# Patient Record
Sex: Female | Born: 1945 | Race: White | Hispanic: No | Marital: Married | Smoking: Never smoker
Health system: Southern US, Community
[De-identification: ages and names within clinical notes are randomized; demographics above are authoritative.]

## PROBLEM LIST (undated history)

## (undated) DIAGNOSIS — N183 Chronic kidney disease, stage 3 unspecified: Secondary | ICD-10-CM

## (undated) DIAGNOSIS — Z87442 Personal history of urinary calculi: Secondary | ICD-10-CM

## (undated) DIAGNOSIS — Z9889 Other specified postprocedural states: Secondary | ICD-10-CM

## (undated) DIAGNOSIS — J189 Pneumonia, unspecified organism: Secondary | ICD-10-CM

## (undated) DIAGNOSIS — Z860100 Personal history of colon polyps, unspecified: Secondary | ICD-10-CM

## (undated) DIAGNOSIS — I82409 Acute embolism and thrombosis of unspecified deep veins of unspecified lower extremity: Secondary | ICD-10-CM

## (undated) DIAGNOSIS — G4730 Sleep apnea, unspecified: Secondary | ICD-10-CM

## (undated) DIAGNOSIS — S92901A Unspecified fracture of right foot, initial encounter for closed fracture: Secondary | ICD-10-CM

## (undated) DIAGNOSIS — E785 Hyperlipidemia, unspecified: Secondary | ICD-10-CM

## (undated) DIAGNOSIS — Z972 Presence of dental prosthetic device (complete) (partial): Secondary | ICD-10-CM

## (undated) DIAGNOSIS — R42 Dizziness and giddiness: Secondary | ICD-10-CM

## (undated) HISTORY — PX: CARDIOVASCULAR STRESS TEST: SHX262

## (undated) HISTORY — DX: Pneumonia, unspecified organism: J18.9

## (undated) HISTORY — PX: COLECTOMY: SHX59

## (undated) HISTORY — PX: APPENDECTOMY: SHX54

## (undated) HISTORY — DX: Unspecified fracture of right foot, initial encounter for closed fracture: S92.901A

## (undated) HISTORY — DX: Other specified postprocedural states: Z98.890

## (undated) HISTORY — DX: Acute embolism and thrombosis of unspecified deep veins of unspecified lower extremity: I82.409

## (undated) HISTORY — PX: CHOLECYSTECTOMY: SHX55

## (undated) HISTORY — PX: ABDOMINAL HYSTERECTOMY: SHX81

## (undated) HISTORY — PX: GLAUCOMA SURGERY: SHX656

## (undated) HISTORY — DX: Personal history of colon polyps, unspecified: Z86.0100

## (undated) HISTORY — PX: CARPAL TUNNEL RELEASE: SHX101

## (undated) HISTORY — PX: KIDNEY STONE SURGERY: SHX686

## (undated) HISTORY — PX: CARDIAC CATHETERIZATION: SHX172

## (undated) HISTORY — PX: OTHER SURGICAL HISTORY: SHX169

## (undated) HISTORY — DX: Personal history of colonic polyps: Z86.010

## (undated) HISTORY — DX: Hyperlipidemia, unspecified: E78.5

---

## 1978-05-12 DIAGNOSIS — Z9889 Other specified postprocedural states: Secondary | ICD-10-CM

## 1978-05-12 HISTORY — DX: Other specified postprocedural states: Z98.890

## 2003-10-13 ENCOUNTER — Other Ambulatory Visit: Payer: Self-pay

## 2004-03-03 ENCOUNTER — Emergency Department: Payer: Self-pay | Admitting: Emergency Medicine

## 2004-03-04 ENCOUNTER — Ambulatory Visit: Payer: Self-pay | Admitting: Emergency Medicine

## 2004-03-25 ENCOUNTER — Ambulatory Visit: Payer: Self-pay | Admitting: Family Medicine

## 2004-03-25 ENCOUNTER — Other Ambulatory Visit: Payer: Self-pay

## 2004-03-25 ENCOUNTER — Emergency Department: Payer: Self-pay | Admitting: Unknown Physician Specialty

## 2004-05-07 ENCOUNTER — Ambulatory Visit: Payer: Self-pay | Admitting: Family Medicine

## 2004-05-16 ENCOUNTER — Ambulatory Visit: Payer: Self-pay | Admitting: Family Medicine

## 2005-02-13 ENCOUNTER — Ambulatory Visit: Payer: Self-pay | Admitting: Family Medicine

## 2005-10-18 ENCOUNTER — Inpatient Hospital Stay: Payer: Self-pay | Admitting: Internal Medicine

## 2005-12-02 ENCOUNTER — Ambulatory Visit: Payer: Self-pay | Admitting: Family Medicine

## 2006-01-19 ENCOUNTER — Ambulatory Visit: Payer: Self-pay | Admitting: Family Medicine

## 2006-03-03 ENCOUNTER — Ambulatory Visit: Payer: Self-pay | Admitting: Gastroenterology

## 2007-02-04 ENCOUNTER — Ambulatory Visit: Payer: Self-pay | Admitting: Family Medicine

## 2007-03-10 ENCOUNTER — Ambulatory Visit: Payer: Self-pay | Admitting: Family Medicine

## 2007-04-23 ENCOUNTER — Emergency Department: Payer: Self-pay | Admitting: Emergency Medicine

## 2007-05-27 ENCOUNTER — Ambulatory Visit: Payer: Self-pay

## 2007-11-11 ENCOUNTER — Ambulatory Visit: Payer: Self-pay | Admitting: Unknown Physician Specialty

## 2007-11-11 ENCOUNTER — Other Ambulatory Visit: Payer: Self-pay

## 2007-11-17 ENCOUNTER — Inpatient Hospital Stay: Payer: Self-pay | Admitting: Unknown Physician Specialty

## 2007-11-19 ENCOUNTER — Other Ambulatory Visit: Payer: Self-pay

## 2008-07-26 ENCOUNTER — Emergency Department: Payer: Self-pay | Admitting: Emergency Medicine

## 2008-10-24 ENCOUNTER — Ambulatory Visit: Payer: Self-pay | Admitting: Family Medicine

## 2008-12-01 ENCOUNTER — Observation Stay: Payer: Self-pay | Admitting: Internal Medicine

## 2008-12-06 ENCOUNTER — Ambulatory Visit: Payer: Self-pay | Admitting: Family Medicine

## 2008-12-07 ENCOUNTER — Inpatient Hospital Stay: Payer: Self-pay | Admitting: Surgery

## 2009-01-08 LAB — HM COLONOSCOPY: HM Colonoscopy: NORMAL

## 2009-03-06 ENCOUNTER — Ambulatory Visit: Payer: Self-pay | Admitting: Unknown Physician Specialty

## 2009-03-14 ENCOUNTER — Ambulatory Visit: Payer: Self-pay | Admitting: Gastroenterology

## 2009-04-12 ENCOUNTER — Inpatient Hospital Stay: Payer: Self-pay | Admitting: Internal Medicine

## 2009-08-09 ENCOUNTER — Ambulatory Visit: Payer: Self-pay | Admitting: Internal Medicine

## 2009-09-24 ENCOUNTER — Ambulatory Visit: Payer: Self-pay | Admitting: Family Medicine

## 2010-03-26 ENCOUNTER — Ambulatory Visit: Payer: Self-pay | Admitting: Internal Medicine

## 2010-05-10 ENCOUNTER — Ambulatory Visit: Payer: Self-pay | Admitting: Internal Medicine

## 2010-09-23 LAB — TSH: TSH: 11.4 u[IU]/mL — AB (ref 0.41–5.90)

## 2010-09-23 LAB — BASIC METABOLIC PANEL
BUN: 15 mg/dL (ref 4–21)
Glucose: 145 mg/dL

## 2010-09-23 LAB — HEPATIC FUNCTION PANEL
AST: 25 U/L (ref 13–35)
Bilirubin, Total: 0.5 mg/dL

## 2010-10-03 ENCOUNTER — Ambulatory Visit: Payer: Medicare Other | Admitting: Internal Medicine

## 2010-10-17 ENCOUNTER — Ambulatory Visit: Payer: Medicare Other | Admitting: Bariatrics

## 2010-12-03 ENCOUNTER — Ambulatory Visit: Payer: Medicare Other | Admitting: Bariatrics

## 2010-12-11 ENCOUNTER — Ambulatory Visit: Payer: Medicare Other | Admitting: Bariatrics

## 2010-12-19 ENCOUNTER — Encounter: Payer: Self-pay | Admitting: Internal Medicine

## 2010-12-23 ENCOUNTER — Ambulatory Visit: Payer: Medicare Other | Admitting: Bariatrics

## 2010-12-25 ENCOUNTER — Encounter: Payer: Self-pay | Admitting: Internal Medicine

## 2010-12-25 DIAGNOSIS — M8580 Other specified disorders of bone density and structure, unspecified site: Secondary | ICD-10-CM | POA: Insufficient documentation

## 2010-12-25 DIAGNOSIS — I82409 Acute embolism and thrombosis of unspecified deep veins of unspecified lower extremity: Secondary | ICD-10-CM

## 2010-12-25 DIAGNOSIS — F319 Bipolar disorder, unspecified: Secondary | ICD-10-CM | POA: Insufficient documentation

## 2010-12-25 DIAGNOSIS — E1165 Type 2 diabetes mellitus with hyperglycemia: Secondary | ICD-10-CM | POA: Insufficient documentation

## 2010-12-25 DIAGNOSIS — IMO0002 Reserved for concepts with insufficient information to code with codable children: Secondary | ICD-10-CM | POA: Insufficient documentation

## 2011-01-09 ENCOUNTER — Encounter: Payer: Self-pay | Admitting: Internal Medicine

## 2011-01-09 ENCOUNTER — Ambulatory Visit (INDEPENDENT_AMBULATORY_CARE_PROVIDER_SITE_OTHER): Payer: Medicare Other | Admitting: Internal Medicine

## 2011-01-09 DIAGNOSIS — E669 Obesity, unspecified: Secondary | ICD-10-CM

## 2011-01-09 DIAGNOSIS — E559 Vitamin D deficiency, unspecified: Secondary | ICD-10-CM

## 2011-01-09 DIAGNOSIS — E119 Type 2 diabetes mellitus without complications: Secondary | ICD-10-CM

## 2011-01-09 MED ORDER — METFORMIN HCL 1000 MG PO TABS: 1000.0000 mg | ORAL_TABLET | Freq: Two times a day (BID) | ORAL | Status: DC

## 2011-01-09 MED ORDER — GLIMEPIRIDE 4 MG PO TABS: 4.0000 mg | ORAL_TABLET | Freq: Two times a day (BID) | ORAL | Status: DC

## 2011-01-09 NOTE — Assessment & Plan Note (Signed)
Assessment:  Obesity with BMI and comorbidities including diabetes mellitus.  Plan:  1. Discussed proper diet (low fat, low sodium, high fiber) with patient and keeping a food log.  2. Discussed need for regular exercise (minumum of 3 times per week, 20 minutes per session) with patient.   3. Pt will continue to follow up with Bariatric Surgery in evaluation for gastric bypass. 4. Follow up in 1 month and as needed.

## 2011-01-09 NOTE — Progress Notes (Signed)
Subjective:    Patient ID: [name], female    DOB: 1945/08/11, 65 y.o.   MRN: [id number]  HPI [name] is a 65 year old female with a history of diabetes and obesity who presents for followup. In regards to her diabetes she reports blood sugars have been stable, mostly near 100 fasting. She has had a couple of low blood sugars which have been symptomatic. These have all been 50 or below. She denies blood sugars above 200. She denies polyuria or polyphasia. She denies any wounds.  [name] has been diligently working on her diet. She reports an 8 pound weight loss in the last month. She is in the process of being evaluated by bariatric surgery for gastric bypass. She recently reports having a sleep study which showed mild sleep apnea. She reports the study also showed frequent runs of SVT. She was sent to cardiology for evaluation. She had echocardiogram and stress test this morning. She is awaiting results from these studies prior to moving forward with her gastric bypass. She has not kept a food log since her last visit. She has not been exercising regularly.  Outpatient Encounter Prescriptions as of 01/09/2011  Medication Sig Dispense Refill  . citalopram (CELEXA) 20 MG tablet Take 20 mg by mouth daily.        Marland Kitchen glimepiride (AMARYL) 4 MG tablet Take 1 tablet (4 mg total) by mouth 2 (two) times daily.  180 tablet  4  . insulin detemir (LEVEMIR) 100 UNIT/ML injection Inject 40 Units into the skin 2 (two) times daily.       . magnesium gluconate (MAGONATE) 500 MG tablet Take 500 mg by mouth daily.        . metFORMIN (GLUCOPHAGE) 1000 MG tablet Take 1 tablet (1,000 mg total) by mouth 2 (two) times daily with a meal.  180 tablet  4    Review of Systems  Constitutional: Negative for fever, chills, appetite change, fatigue and unexpected weight change.  HENT: Negative for ear pain, congestion, neck pain and sinus pressure.   Eyes: Negative for visual disturbance.  Respiratory: Negative for  cough, shortness of breath, wheezing and stridor.   Cardiovascular: Negative for chest pain, palpitations and leg swelling.  Gastrointestinal: Negative for nausea, vomiting, abdominal pain, diarrhea, constipation, blood in stool, abdominal distention and anal bleeding.  Genitourinary: Negative for dysuria and flank pain.  Musculoskeletal: Negative for myalgias, arthralgias and gait problem.  Skin: Negative for color change and rash.  Neurological: Negative for dizziness and headaches.  Hematological: Negative for adenopathy. Does not bruise/bleed easily.  Psychiatric/Behavioral: Negative for suicidal ideas, sleep disturbance and dysphoric mood. The patient is not nervous/anxious.     BP 110/69  Pulse 85  Temp(Src) 98.6 F (37 C) (Oral)  Resp 16  Ht 5\' 2"  (1.575 m)  Wt 235 lb (106.595 kg)  BMI 42.98 kg/m2  SpO2 97%     Objective:   Physical Exam  Constitutional: She is oriented to person, place, and time. She appears well-developed and well-nourished. No distress.  HENT:  Head: Normocephalic and atraumatic.  Right Ear: External ear normal.  Left Ear: External ear normal.  Nose: Nose normal.  Mouth/Throat: Oropharynx is clear and moist. No oropharyngeal exudate.  Eyes: Conjunctivae and EOM are normal. Pupils are equal, round, and reactive to light. Right eye exhibits no discharge. Left eye exhibits no discharge. No scleral icterus.  Neck: Normal range of motion. Neck supple. No tracheal deviation present. No thyromegaly present.  Cardiovascular: Normal rate,  regular rhythm, normal heart sounds and intact distal pulses.  Exam reveals no gallop and no friction rub.   No murmur heard. Pulmonary/Chest: Effort normal and breath sounds normal. No respiratory distress. She has no wheezes. She has no rales. She exhibits no tenderness.  Musculoskeletal: Normal range of motion. She exhibits no edema and no tenderness.  Lymphadenopathy:    She has no cervical adenopathy.  Neurological: She  is alert and oriented to person, place, and time. No cranial nerve deficit. She exhibits normal muscle tone. Coordination normal.  Skin: Skin is warm and dry. No rash noted. She is not diaphoretic. No erythema. No pallor.  Psychiatric: She has a normal mood and affect. Her behavior is normal. Judgment and thought content normal.          Assessment & Plan:

## 2011-01-09 NOTE — Assessment & Plan Note (Addendum)
    Assessment:    Diabetes Mellitus type II, under good control.    Plan:    1.  Rx changes: none A1c today with labs. 2.  Education: Reviewed 'ABCs' of diabetes management (respective goals in parentheses):  A1C (<7), blood pressure (<130/80), and cholesterol (LDL <100). 3.  Compliance at present is estimated to be good. Efforts to improve compliance (if necessary) will be directed at dietary modifications: avoidance of processed carbohydrates and increased exercise. 4. Follow up: 1 month

## 2011-01-10 ENCOUNTER — Encounter: Payer: Self-pay | Admitting: Internal Medicine

## 2011-01-10 LAB — COMPREHENSIVE METABOLIC PANEL
AST: 37 U/L (ref 0–37)
BUN: 15 mg/dL (ref 6–23)
Calcium: 9.6 mg/dL (ref 8.4–10.5)
Chloride: 102 mEq/L (ref 96–112)
Creatinine, Ser: 0.6 mg/dL (ref 0.4–1.2)
GFR: 110.81 mL/min (ref >60.00)
Total Bilirubin: 0.5 mg/dL (ref 0.3–1.2)

## 2011-01-10 LAB — HEMOGLOBIN A1C: Hgb A1c MFr Bld: 9 % — ABNORMAL HIGH (ref 4.6–6.5)

## 2011-01-14 ENCOUNTER — Telehealth: Payer: Self-pay | Admitting: Internal Medicine

## 2011-01-14 NOTE — Telephone Encounter (Signed)
Can you please ask what her question is?

## 2011-01-14 NOTE — Telephone Encounter (Signed)
Patient called to let you know they did admit her at [facility], she has a question, her phone in her room is [phone]

## 2011-01-14 NOTE — Telephone Encounter (Signed)
Spoke with patient and she stated that she has just finished Adenosine and their thinking about doing the catherization  Tomorrow.

## 2011-01-17 ENCOUNTER — Ambulatory Visit (INDEPENDENT_AMBULATORY_CARE_PROVIDER_SITE_OTHER): Payer: Medicare Other | Admitting: Internal Medicine

## 2011-01-17 DIAGNOSIS — I10 Essential (primary) hypertension: Secondary | ICD-10-CM

## 2011-01-17 MED ORDER — CARVEDILOL 6.25 MG PO TABS: 6.2500 mg | ORAL_TABLET | Freq: Two times a day (BID) | ORAL | Status: DC

## 2011-01-19 NOTE — Progress Notes (Signed)
  Subjective:    Patient ID: [name], female    DOB: Mar 02, 1946, 65 y.o.   MRN: [id number]  HPI    Review of Systems     Objective:   Physical Exam        Assessment & Plan:  Orders only entry

## 2011-01-22 ENCOUNTER — Encounter: Payer: Self-pay | Admitting: Internal Medicine

## 2011-01-23 ENCOUNTER — Ambulatory Visit (INDEPENDENT_AMBULATORY_CARE_PROVIDER_SITE_OTHER): Payer: Medicare Other | Admitting: Internal Medicine

## 2011-01-23 ENCOUNTER — Encounter: Payer: Self-pay | Admitting: Internal Medicine

## 2011-01-23 VITALS — BP 110/65 | HR 70 | Temp 98.5°F | Resp 20 | Ht 62.0 in | Wt 241.5 lb

## 2011-01-23 DIAGNOSIS — I209 Angina pectoris, unspecified: Secondary | ICD-10-CM

## 2011-01-23 DIAGNOSIS — I208 Other forms of angina pectoris: Secondary | ICD-10-CM

## 2011-01-23 DIAGNOSIS — M810 Age-related osteoporosis without current pathological fracture: Secondary | ICD-10-CM

## 2011-01-23 DIAGNOSIS — F319 Bipolar disorder, unspecified: Secondary | ICD-10-CM

## 2011-01-23 DIAGNOSIS — E669 Obesity, unspecified: Secondary | ICD-10-CM

## 2011-01-23 DIAGNOSIS — E119 Type 2 diabetes mellitus without complications: Secondary | ICD-10-CM

## 2011-01-23 LAB — GLUCOSE, POCT (MANUAL RESULT ENTRY): POC Glucose: 166

## 2011-01-23 MED ORDER — PANTOPRAZOLE SODIUM 40 MG PO TBEC: 40.0000 mg | DELAYED_RELEASE_TABLET | Freq: Every day | ORAL | Status: DC

## 2011-01-23 NOTE — Progress Notes (Signed)
Subjective:    Patient ID: [name], female    DOB: 1946/04/28, 65 y.o.   MRN: [id number]  HPI Ms. Larson is a 65 year old female with a history of diabetes, obesity, depression, and recently coronary artery disease status post admission at [facility]. She was admitted last month with intermittent exertional chest pain. She had nuclear stress test which was abnormal. She was ultimately admitted at [facility] and underwent cardiac catheterization. She reports that coronary artery disease was found but not significant enough to make an intervention at this time. Since her admission, she continues to have exertional chest pain. This occurs with minimal exertion such as walking in her garden. She takes a single nitroglycerin with relief. She has not had to take more than one nitroglycerin. During these episodes she does experience diaphoresis, and shortness of breath. She has not yet followed up with her cardiologist.  She reports that her blood sugars have been well controlled recently with fasting sugars near 100-150. She has been compliant with her Levemir.  Prior to her hospitalization she had been making arrangements for gastric bypass surgery. In the evaluation process for surgery she underwent a sleep study which revealed sleep apnea. She has not yet started CPAP.  Outpatient Encounter Prescriptions as of 01/23/2011  Medication Sig Dispense Refill  . aspirin 325 MG tablet Take 325 mg by mouth daily.        Marland Kitchen lisinopril (PRINIVIL,ZESTRIL) 5 MG tablet Take 5 mg by mouth daily.        . nitroGLYCERIN (NITROSTAT) 0.4 MG SL tablet Place 0.4 mg under the tongue every 5 (five) minutes as needed.        Marland Kitchen DISCONTD: omeprazole (PRILOSEC) 20 MG capsule Take 20 mg by mouth 2 (two) times daily.        . carvedilol (COREG) 6.25 MG tablet Take 1 tablet (6.25 mg total) by mouth 2 (two) times daily.  180 tablet  4  . citalopram (CELEXA) 20 MG tablet Take 20 mg by mouth daily.        Marland Kitchen  glimepiride (AMARYL) 4 MG tablet Take 1 tablet (4 mg total) by mouth 2 (two) times daily.  180 tablet  4  . insulin detemir (LEVEMIR) 100 UNIT/ML injection Inject 40 Units into the skin 2 (two) times daily.       . metFORMIN (GLUCOPHAGE) 1000 MG tablet Take 1 tablet (1,000 mg total) by mouth 2 (two) times daily with a meal.  180 tablet  4  . pantoprazole (PROTONIX) 40 MG tablet Take 1 tablet (40 mg total) by mouth daily.  90 tablet  4    Review of Systems  Constitutional: Positive for diaphoresis and fatigue. Negative for fever, chills, appetite change and unexpected weight change.  HENT: Negative for ear pain, congestion, sore throat, trouble swallowing, neck pain, voice change and sinus pressure.   Eyes: Negative for visual disturbance.  Respiratory: Positive for shortness of breath. Negative for cough, wheezing and stridor.   Cardiovascular: Positive for chest pain and leg swelling. Negative for palpitations.  Gastrointestinal: Negative for nausea, vomiting, abdominal pain, diarrhea, constipation, blood in stool, abdominal distention and anal bleeding.  Genitourinary: Negative for dysuria and flank pain.  Musculoskeletal: Negative for myalgias, arthralgias and gait problem.  Skin: Negative for color change and rash.  Neurological: Negative for dizziness and headaches.  Hematological: Negative for adenopathy. Does not bruise/bleed easily.  Psychiatric/Behavioral: Negative for suicidal ideas, sleep disturbance and dysphoric mood. The patient is not nervous/anxious.  BP 110/65  Pulse 70  Temp(Src) 98.5 F (36.9 C) (Oral)  Resp 20  Ht 5\' 2"  (1.575 m)  Wt 241 lb 8 oz (109.544 kg)  BMI 44.17 kg/m2  SpO2 97%     Objective:   Physical Exam  Constitutional: She is oriented to person, place, and time. She appears well-developed and well-nourished. No distress.  HENT:  Head: Normocephalic and atraumatic.  Right Ear: External ear normal.  Left Ear: External ear normal.  Nose: Nose  normal.  Mouth/Throat: Oropharynx is clear and moist. No oropharyngeal exudate.  Eyes: Conjunctivae are normal. Pupils are equal, round, and reactive to light. Right eye exhibits no discharge. Left eye exhibits no discharge. No scleral icterus.  Neck: Normal range of motion. Neck supple. No tracheal deviation present. No thyromegaly present.  Cardiovascular: Normal rate, regular rhythm, normal heart sounds and intact distal pulses.  Exam reveals no gallop and no friction rub.   No murmur heard. Pulmonary/Chest: Effort normal and breath sounds normal. No respiratory distress. She has no wheezes. She has no rales. She exhibits no tenderness.  Musculoskeletal: Normal range of motion. She exhibits no edema and no tenderness.  Lymphadenopathy:    She has no cervical adenopathy.  Neurological: She is alert and oriented to person, place, and time. No cranial nerve deficit. She exhibits normal muscle tone. Coordination normal.  Skin: Skin is warm and dry. No rash noted. She is not diaphoretic. No erythema. No pallor.  Psychiatric: She has a normal mood and affect. Her behavior is normal. Judgment and thought content normal.          Assessment & Plan:  1. Angina - patient status post recent admission for coronary artery disease. Will request records on her hospitalization and cardiac catheter. Given that she has persistent angina and reports that she had no significant blockages, I question if she may have small vessel disease. If her symptoms persist we discussed placing her on a long-acting nitrate. However, her blood pressure tends to run on the low side it is not clear that she will tolerate a nitrate. We will plan for her to followup in 2 weeks.  2. Diabetes - patient with diabetes which has recently been fairly well controlled. Last hemoglobin A1c was 9. However, this is likely improved after her recent hospitalization as she reports her blood sugars have been closer to 100 fasting. We'll plan to  repeat her hemoglobin A1c in November 2012.  3. Obesity - patient with obesity BMI is 44. Prior to her hospitalization she was undergoing evaluation for gastric bypass surgery. Given her recent diagnosis of coronary artery disease and ongoing angina we will plan for her to hold off on further surgical evaluation at this time.  4. Sleep Apnea - patient reports a recent sleep study showed mild sleep apnea. We will request results on this. I've encouraged her to consider using CPAP given the risk associated with untreated sleep apnea.

## 2011-02-12 ENCOUNTER — Ambulatory Visit (INDEPENDENT_AMBULATORY_CARE_PROVIDER_SITE_OTHER): Payer: Medicare Other | Admitting: Internal Medicine

## 2011-02-12 ENCOUNTER — Encounter: Payer: Self-pay | Admitting: Internal Medicine

## 2011-02-12 VITALS — BP 100/60 | HR 82 | Temp 98.4°F | Resp 20 | Wt 248.0 lb

## 2011-02-12 DIAGNOSIS — E669 Obesity, unspecified: Secondary | ICD-10-CM

## 2011-02-12 DIAGNOSIS — K219 Gastro-esophageal reflux disease without esophagitis: Secondary | ICD-10-CM

## 2011-02-12 DIAGNOSIS — J40 Bronchitis, not specified as acute or chronic: Secondary | ICD-10-CM

## 2011-02-12 DIAGNOSIS — E119 Type 2 diabetes mellitus without complications: Secondary | ICD-10-CM

## 2011-02-12 DIAGNOSIS — I10 Essential (primary) hypertension: Secondary | ICD-10-CM

## 2011-02-12 MED ORDER — PANTOPRAZOLE SODIUM 40 MG PO TBEC: 40.0000 mg | DELAYED_RELEASE_TABLET | Freq: Every day | ORAL | Status: DC

## 2011-02-12 MED ORDER — AMOXICILLIN-POT CLAVULANATE 875-125 MG PO TABS: 1.0000 | ORAL_TABLET | Freq: Two times a day (BID) | ORAL | Status: AC

## 2011-02-12 NOTE — Progress Notes (Signed)
Subjective:    Patient ID: [name], female    DOB: 03-Dec-1945, 65 y.o.   MRN: [id number]  HPI [name] is a 65 year old female with a history of obesity, diabetes who presents for followup. Her primary concern today is recent lightheadedness. She reports this has been limiting her ability to exercise. She notes that this started with the addition of HER-2 blood pressure medicines carvedilol and lisinopril. These were added when she was recently hospitalized with chest pain. She has never been diagnosed with hypertension in the past. She reports that her blood pressure has been low over the last few weeks typically running in the 80s to 90s over 40s to 50s. She would like to stop her blood pressure medication.  [name] reports improved diet choices in an effort to lose weight. She reports limiting her intake of food at night. She reports very little physical activity because of recent lightheadedness.  [name] reports a recent weeklong history of nasal congestion and cough. She has been around her grandchildren who have been ill. She denies any fever or chills. She does have a history of recurrent bronchitis and notes slight increase in dyspnea on exertion.  In regards to her diabetes, [name] reports full compliance with her medications. She reports a few low blood sugars less than 80 for which she has been symptomatic. She occasionally takes less than 40 units of levemir if she has a low blood sugar. She denies any blood sugars over 250.  Outpatient Encounter Prescriptions as of 02/12/2011  Medication Sig Dispense Refill  . aspirin 81 MG tablet Take 81 mg by mouth daily.        . citalopram (CELEXA) 20 MG tablet Take 20 mg by mouth daily.        Marland Kitchen glimepiride (AMARYL) 4 MG tablet Take 1 tablet (4 mg total) by mouth 2 (two) times daily.  180 tablet  4  . insulin detemir (LEVEMIR) 100 UNIT/ML injection Inject 40 Units into the skin 2 (two) times daily.       Marland Kitchen lisinopril  (PRINIVIL,ZESTRIL) 5 MG tablet Take 5 mg by mouth daily.        . metFORMIN (GLUCOPHAGE) 1000 MG tablet Take 1 tablet (1,000 mg total) by mouth 2 (two) times daily with a meal.  180 tablet  4  . nitroGLYCERIN (NITROSTAT) 0.4 MG SL tablet Place 0.4 mg under the tongue every 5 (five) minutes as needed.        . pantoprazole (PROTONIX) 40 MG tablet Take 1 tablet (40 mg total) by mouth daily.  90 tablet  4    Review of Systems  Constitutional: Positive for fatigue. Negative for fever, chills, appetite change and unexpected weight change.  HENT: Positive for congestion, postnasal drip and sinus pressure. Negative for ear pain, sore throat, trouble swallowing, neck pain and voice change.   Eyes: Negative for visual disturbance.  Respiratory: Positive for cough. Negative for shortness of breath, wheezing and stridor.   Cardiovascular: Negative for chest pain, palpitations and leg swelling.  Gastrointestinal: Negative for nausea, vomiting, abdominal pain, diarrhea, constipation, blood in stool, abdominal distention and anal bleeding.  Genitourinary: Negative for dysuria and flank pain.  Musculoskeletal: Negative for myalgias, arthralgias and gait problem.  Skin: Negative for color change and rash.  Neurological: Positive for dizziness and light-headedness. Negative for headaches.  Hematological: Negative for adenopathy. Does not bruise/bleed easily.  Psychiatric/Behavioral: Negative for suicidal ideas, sleep disturbance and dysphoric mood. The patient is not nervous/anxious.  BP 100/60  Pulse 82  Temp(Src) 98.4 F (36.9 C) (Oral)  Resp 20  Wt 248 lb (112.492 kg)  SpO2 97%     Objective:   Physical Exam  Constitutional: She is oriented to person, place, and time. She appears well-developed and well-nourished. No distress.  HENT:  Head: Normocephalic and atraumatic.  Right Ear: Tympanic membrane, external ear and ear canal normal.  Left Ear: Tympanic membrane, external ear and ear canal  normal.  Nose: Nose normal.  Mouth/Throat: Oropharynx is clear and moist. No oropharyngeal exudate.  Eyes: Conjunctivae are normal. Pupils are equal, round, and reactive to light. Right eye exhibits no discharge. Left eye exhibits no discharge. No scleral icterus.  Neck: Normal range of motion. Neck supple. No tracheal deviation present. No thyromegaly present.  Cardiovascular: Normal rate, regular rhythm, normal heart sounds and intact distal pulses.  Exam reveals no gallop and no friction rub.   No murmur heard. Pulmonary/Chest: Effort normal. No respiratory distress. She has wheezes. She has no rales. She exhibits no tenderness.  Musculoskeletal: Normal range of motion. She exhibits no edema and no tenderness.  Lymphadenopathy:    She has no cervical adenopathy.  Neurological: She is alert and oriented to person, place, and time. No cranial nerve deficit. She exhibits normal muscle tone. Coordination normal.  Skin: Skin is warm and dry. No rash noted. She is not diaphoretic. No erythema. No pallor.  Psychiatric: She has a normal mood and affect. Her behavior is normal. Judgment and thought content normal.          Assessment & Plan:  1. Obesity - patient has improved dietary choices but continues to be sedentary. Encouraged her to keep calories less than 1500 per day. Encouraged increased physical activity over the next few weeks with a goal of at least 10 minutes per day. She will followup in one month.  2. Lightheadedness - suspect lightheadedness is secondary to hypotension given the patient was recently started on antihypertensive medications. We'll stop her carvedilol. She will monitor her blood pressure and call with an update next week.  3. Cough - patient with recent cough and increased dyspnea. Exam is consistent with bronchitis. We'll start Augmentin. Patient will followup in one month or earlier should symptoms worsen.  4. Diabetes - patient with diabetes which she reports  has been better controlled recently on Levemir. Encouraged her to decrease dose of Levemir to 30 units twice daily should she have repeated low blood sugars below 80. She will call with an update early next week and followup in one month.

## 2011-02-12 NOTE — Patient Instructions (Signed)
Call early next week with blood pressure readings.

## 2011-02-25 ENCOUNTER — Encounter: Payer: Medicare Other | Admitting: Internal Medicine

## 2011-03-05 ENCOUNTER — Other Ambulatory Visit: Payer: Self-pay | Admitting: Internal Medicine

## 2011-03-05 DIAGNOSIS — E785 Hyperlipidemia, unspecified: Secondary | ICD-10-CM

## 2011-03-05 DIAGNOSIS — Z0000 Encounter for general adult medical examination without abnormal findings: Secondary | ICD-10-CM

## 2011-03-06 ENCOUNTER — Other Ambulatory Visit (INDEPENDENT_AMBULATORY_CARE_PROVIDER_SITE_OTHER): Payer: Medicare Other | Admitting: *Deleted

## 2011-03-06 ENCOUNTER — Ambulatory Visit: Payer: Medicare Other

## 2011-03-06 DIAGNOSIS — Z23 Encounter for immunization: Secondary | ICD-10-CM

## 2011-03-06 DIAGNOSIS — Z0000 Encounter for general adult medical examination without abnormal findings: Secondary | ICD-10-CM

## 2011-03-06 DIAGNOSIS — E785 Hyperlipidemia, unspecified: Secondary | ICD-10-CM

## 2011-03-06 DIAGNOSIS — E119 Type 2 diabetes mellitus without complications: Secondary | ICD-10-CM

## 2011-03-06 LAB — COMPREHENSIVE METABOLIC PANEL
Albumin: 4 g/dL (ref 3.5–5.2)
Alkaline Phosphatase: 58 U/L (ref 39–117)
BUN: 13 mg/dL (ref 6–23)
GFR: 90.64 mL/min (ref >60.00)
Glucose, Bld: 118 mg/dL — ABNORMAL HIGH (ref 70–99)
Potassium: 3.7 mEq/L (ref 3.5–5.1)
Total Bilirubin: 0.6 mg/dL (ref 0.3–1.2)

## 2011-03-06 LAB — TSH: TSH: 0.63 u[IU]/mL (ref 0.35–5.50)

## 2011-03-06 LAB — HEMOGLOBIN A1C: Hgb A1c MFr Bld: 9.4 % — ABNORMAL HIGH (ref 4.6–6.5)

## 2011-03-06 LAB — LIPID PANEL: HDL: 52.8 mg/dL (ref >39.00)

## 2011-03-06 NOTE — Progress Notes (Signed)
Addended by: Jobie Quaker on: 03/06/2011 02:45 PM   Modules accepted: Orders

## 2011-03-13 ENCOUNTER — Encounter: Payer: Medicare Other | Admitting: Internal Medicine

## 2011-03-13 ENCOUNTER — Telehealth: Payer: Self-pay | Admitting: Internal Medicine

## 2011-03-13 NOTE — Telephone Encounter (Signed)
error 

## 2011-03-18 ENCOUNTER — Ambulatory Visit: Payer: Medicare Other | Admitting: Internal Medicine

## 2011-03-21 ENCOUNTER — Ambulatory Visit: Payer: Medicare Other | Admitting: Internal Medicine

## 2011-03-26 ENCOUNTER — Ambulatory Visit: Payer: Medicare Other | Admitting: Internal Medicine

## 2011-04-07 ENCOUNTER — Encounter: Payer: Self-pay | Admitting: Internal Medicine

## 2011-04-07 ENCOUNTER — Ambulatory Visit (INDEPENDENT_AMBULATORY_CARE_PROVIDER_SITE_OTHER): Payer: Medicare Other | Admitting: Internal Medicine

## 2011-04-07 VITALS — BP 148/80 | HR 57 | Temp 98.0°F | Wt 243.0 lb

## 2011-04-07 DIAGNOSIS — R0602 Shortness of breath: Secondary | ICD-10-CM

## 2011-04-07 DIAGNOSIS — R0789 Other chest pain: Secondary | ICD-10-CM

## 2011-04-07 NOTE — Progress Notes (Signed)
Subjective:    Patient ID: [name], female    DOB: 1945-09-01, 65 y.o.   MRN: [id number]  HPI 65 year old female with a history of diabetes, hypertension, hyperlipidemia, and obesity presents for followup. Today she reports that she is feeling very poorly. She woke up with nausea and tightness across her anterior chest. She also reports feeling lightheaded and fatigued. She denies any fever or chills. She has not vomited. She feels mild shortness of breath. She denies palpitations. She denies any sick contacts.  Outpatient Encounter Prescriptions as of 04/07/2011  Medication Sig Dispense Refill  . aspirin 81 MG tablet Take 81 mg by mouth daily.        . citalopram (CELEXA) 20 MG tablet Take 20 mg by mouth daily.        Marland Kitchen glimepiride (AMARYL) 4 MG tablet Take 1 tablet (4 mg total) by mouth 2 (two) times daily.  180 tablet  4  . insulin detemir (LEVEMIR) 100 UNIT/ML injection Inject 40 Units into the skin 2 (two) times daily.       Marland Kitchen lisinopril (PRINIVIL,ZESTRIL) 5 MG tablet Take 5 mg by mouth daily.        . metFORMIN (GLUCOPHAGE) 1000 MG tablet Take 1 tablet (1,000 mg total) by mouth 2 (two) times daily with a meal.  180 tablet  4  . nitroGLYCERIN (NITROSTAT) 0.4 MG SL tablet Place 0.4 mg under the tongue every 5 (five) minutes as needed.        . pantoprazole (PROTONIX) 40 MG tablet Take 1 tablet (40 mg total) by mouth daily.  90 tablet  4    Review of Systems  Constitutional: Negative for fever, chills, appetite change, fatigue and unexpected weight change.  HENT: Negative for ear pain, congestion, sore throat, trouble swallowing, neck pain, voice change and sinus pressure.   Eyes: Negative for visual disturbance.  Respiratory: Positive for shortness of breath. Negative for cough, wheezing and stridor.   Cardiovascular: Positive for chest pain. Negative for palpitations and leg swelling.  Gastrointestinal: Positive for nausea. Negative for vomiting, abdominal pain, diarrhea,  constipation, blood in stool, abdominal distention and anal bleeding.  Genitourinary: Negative for dysuria and flank pain.  Musculoskeletal: Negative for myalgias, arthralgias and gait problem.  Skin: Negative for color change and rash.  Neurological: Negative for dizziness and headaches.  Hematological: Negative for adenopathy. Does not bruise/bleed easily.  Psychiatric/Behavioral: Negative for suicidal ideas, sleep disturbance and dysphoric mood. The patient is not nervous/anxious.    BP 148/80  Pulse 57  Temp(Src) 98 F (36.7 C) (Oral)  Wt 243 lb (110.224 kg)  SpO2 92%     Objective:   Physical Exam  Constitutional: She is oriented to person, place, and time. She appears well-developed and well-nourished. She has a sickly appearance (lying on exam table, pale, diaphoretic). No distress.  HENT:  Head: Normocephalic and atraumatic.  Right Ear: External ear normal.  Left Ear: External ear normal.  Nose: Nose normal.  Mouth/Throat: Oropharynx is clear and moist. No oropharyngeal exudate.  Eyes: Conjunctivae are normal. Pupils are equal, round, and reactive to light. Right eye exhibits no discharge. Left eye exhibits no discharge. No scleral icterus.  Neck: Normal range of motion. Neck supple. No tracheal deviation present. No thyromegaly present.  Cardiovascular: Regular rhythm, normal heart sounds and intact distal pulses.  Bradycardia present.  Exam reveals no gallop and no friction rub.   No murmur heard. Pulmonary/Chest: Effort normal and breath sounds normal. No respiratory distress. She has  no wheezes. She has no rales. She exhibits no tenderness.  Abdominal: Soft. She exhibits no distension. There is no tenderness.  Musculoskeletal: Normal range of motion. She exhibits no edema and no tenderness.  Lymphadenopathy:    She has no cervical adenopathy.  Neurological: She is alert and oriented to person, place, and time. No cranial nerve deficit. She exhibits normal muscle tone.  Coordination normal.  Skin: Skin is warm and dry. No rash noted. She is not diaphoretic. No erythema. No pallor.  Psychiatric: She has a normal mood and affect. Her behavior is normal. Judgment and thought content normal.          Assessment & Plan:  1. Chest pain - Concerning for cardiac ischemia. EKG is markable for bradycardia. Notably, by report, patient had a normal cardiac catheterization approximately one month ago. However, given chest pain, shortness of breath, nausea, and new onset of bradycardia recommended that she is evaluated in the emergency department with labs including troponin levels and close monitoring.

## 2011-04-12 ENCOUNTER — Encounter: Payer: Medicare Other | Admitting: Internal Medicine

## 2011-04-22 ENCOUNTER — Ambulatory Visit (INDEPENDENT_AMBULATORY_CARE_PROVIDER_SITE_OTHER): Payer: Medicare Other | Admitting: Internal Medicine

## 2011-04-22 ENCOUNTER — Encounter: Payer: Self-pay | Admitting: Internal Medicine

## 2011-04-22 VITALS — BP 142/86 | HR 58 | Temp 98.1°F | Wt 244.0 lb

## 2011-04-22 DIAGNOSIS — E119 Type 2 diabetes mellitus without complications: Secondary | ICD-10-CM

## 2011-04-22 DIAGNOSIS — J329 Chronic sinusitis, unspecified: Secondary | ICD-10-CM

## 2011-04-22 MED ORDER — AMOXICILLIN-POT CLAVULANATE 875-125 MG PO TABS: 1.0000 | ORAL_TABLET | Freq: Two times a day (BID) | ORAL | Status: AC

## 2011-04-22 NOTE — Progress Notes (Signed)
Subjective:    Patient ID: [name], female    DOB: 15-Dec-1945, 65 y.o.   MRN: [id number]  HPI 65YO female with DM, HTN, HL, obesity presents for follow up.  Reports she has been doing well. Notes that last episode of chest pain and malaise was associated with use of Tramadol and resolved with stopping medication.  Denies any further chest pain, dyspnea, palpitations.  Has been feeling well.  In regards to diabetes, notes that BG well controlled recently. BG this morning 102. Reports full compliance with meds.  In regards to obesity, has not been physically active because of chronic back pain issues.  Has not been keeping a food diary. Has tried to limit food intake and calories.  Concern today about sinus pressure x 5 days, frontal facial pain, purulent nasal drainage. Denies fever, cough.  Symptoms not alleviated with tylenol.  Outpatient Encounter Prescriptions as of 04/22/2011  Medication Sig Dispense Refill  . aspirin 81 MG tablet Take 81 mg by mouth daily.        . citalopram (CELEXA) 20 MG tablet Take 20 mg by mouth daily.        Marland Kitchen glimepiride (AMARYL) 4 MG tablet Take 1 tablet (4 mg total) by mouth 2 (two) times daily.  180 tablet  4  . insulin detemir (LEVEMIR) 100 UNIT/ML injection Inject 40 Units into the skin 2 (two) times daily.       . metFORMIN (GLUCOPHAGE) 1000 MG tablet Take 1 tablet (1,000 mg total) by mouth 2 (two) times daily with a meal.  180 tablet  4  . nitroGLYCERIN (NITROSTAT) 0.4 MG SL tablet Place 0.4 mg under the tongue every 5 (five) minutes as needed.        . pantoprazole (PROTONIX) 40 MG tablet Take 1 tablet (40 mg total) by mouth daily.  90 tablet  4  . lisinopril (PRINIVIL,ZESTRIL) 5 MG tablet Take 5 mg by mouth daily.          Review of Systems  Constitutional: Negative for fever, chills, appetite change, fatigue and unexpected weight change.  HENT: Positive for congestion and sinus pressure. Negative for ear pain, sore throat, trouble swallowing and  neck pain.   Eyes: Negative for visual disturbance.  Respiratory: Negative for cough, shortness of breath, wheezing and stridor.   Cardiovascular: Negative for chest pain, palpitations and leg swelling.  Gastrointestinal: Negative for nausea, abdominal pain, diarrhea and constipation.  Genitourinary: Negative for dysuria and flank pain.  Musculoskeletal: Negative for myalgias, arthralgias and gait problem.  Skin: Negative for color change and rash.  Neurological: Negative for dizziness and headaches.  Hematological: Negative for adenopathy. Does not bruise/bleed easily.  Psychiatric/Behavioral: Negative for suicidal ideas, sleep disturbance and dysphoric mood. The patient is not nervous/anxious.    BP 142/86  Pulse 58  Temp(Src) 98.1 F (36.7 C) (Oral)  Wt 244 lb (110.678 kg)  SpO2 97%     Objective:   Physical Exam  Constitutional: She is oriented to person, place, and time. She appears well-developed and well-nourished. No distress.  HENT:  Head: Normocephalic and atraumatic.  Right Ear: External ear normal.  Left Ear: External ear normal.  Nose: Mucosal edema present. Right sinus exhibits frontal sinus tenderness. Left sinus exhibits frontal sinus tenderness.  Mouth/Throat: Oropharynx is clear and moist. No oropharyngeal exudate.  Eyes: Conjunctivae are normal. Pupils are equal, round, and reactive to light. Right eye exhibits no discharge. Left eye exhibits no discharge. No scleral icterus.  Neck: Normal range  of motion. Neck supple. No tracheal deviation present. No thyromegaly present.  Cardiovascular: Normal rate, regular rhythm, normal heart sounds and intact distal pulses.  Exam reveals no gallop and no friction rub.   No murmur heard. Pulmonary/Chest: Effort normal and breath sounds normal. No respiratory distress. She has no wheezes. She has no rales. She exhibits no tenderness.  Musculoskeletal: Normal range of motion. She exhibits no edema and no tenderness.    Lymphadenopathy:    She has no cervical adenopathy.  Neurological: She is alert and oriented to person, place, and time. No cranial nerve deficit. She exhibits normal muscle tone. Coordination normal.  Skin: Skin is warm and dry. No rash noted. She is not diaphoretic. No erythema. No pallor.  Psychiatric: She has a normal mood and affect. Her behavior is normal. Judgment and thought content normal.          Assessment & Plan:  1. Sinusitis - Symptoms and exam c/w sinusitis. Will treat with Augmentin x 10 days.  Will use tylenol as needed for pain. Follow up if symptoms not improving next 48hr.  2. Obesity - Weight unchanged. Pt has not been exercising because of back pain. Has been trying to limit caloric intake, but not keeping a food diary. Encouraged healthy food choices and keeping a food diary. Follow up 1 month.  3. Diabetes - Pt reports good control of BG. Last A1c was elevated at 9.4%.  Will check A1c with labs next month.

## 2011-05-13 ENCOUNTER — Encounter: Payer: Self-pay | Admitting: Internal Medicine

## 2011-05-20 DIAGNOSIS — F339 Major depressive disorder, recurrent, unspecified: Secondary | ICD-10-CM | POA: Diagnosis not present

## 2011-05-26 ENCOUNTER — Ambulatory Visit: Payer: Medicare Other | Admitting: Internal Medicine

## 2011-06-04 ENCOUNTER — Emergency Department: Payer: Self-pay

## 2011-06-04 DIAGNOSIS — M549 Dorsalgia, unspecified: Secondary | ICD-10-CM | POA: Diagnosis not present

## 2011-06-04 DIAGNOSIS — I10 Essential (primary) hypertension: Secondary | ICD-10-CM | POA: Diagnosis not present

## 2011-06-04 DIAGNOSIS — Z79899 Other long term (current) drug therapy: Secondary | ICD-10-CM | POA: Diagnosis not present

## 2011-06-04 DIAGNOSIS — I999 Unspecified disorder of circulatory system: Secondary | ICD-10-CM | POA: Diagnosis not present

## 2011-06-04 DIAGNOSIS — G8929 Other chronic pain: Secondary | ICD-10-CM | POA: Diagnosis not present

## 2011-06-04 DIAGNOSIS — M216X9 Other acquired deformities of unspecified foot: Secondary | ICD-10-CM | POA: Diagnosis not present

## 2011-06-04 DIAGNOSIS — R52 Pain, unspecified: Secondary | ICD-10-CM | POA: Diagnosis not present

## 2011-06-04 DIAGNOSIS — M47817 Spondylosis without myelopathy or radiculopathy, lumbosacral region: Secondary | ICD-10-CM | POA: Diagnosis not present

## 2011-06-04 DIAGNOSIS — E119 Type 2 diabetes mellitus without complications: Secondary | ICD-10-CM | POA: Diagnosis not present

## 2011-06-04 DIAGNOSIS — R1110 Vomiting, unspecified: Secondary | ICD-10-CM | POA: Diagnosis not present

## 2011-06-04 DIAGNOSIS — Z9889 Other specified postprocedural states: Secondary | ICD-10-CM | POA: Diagnosis not present

## 2011-06-04 DIAGNOSIS — R51 Headache: Secondary | ICD-10-CM | POA: Diagnosis not present

## 2011-06-04 LAB — URINALYSIS, COMPLETE
Bilirubin,UR: NEGATIVE
Glucose,UR: >=500 mg/dL (ref 0–75)
Leukocyte Esterase: NEGATIVE
Nitrite: NEGATIVE
Ph: 5 (ref 4.5–8.0)
RBC,UR: 1 /HPF (ref 0–5)
Squamous Epithelial: 3
WBC UR: 5 /HPF (ref 0–5)

## 2011-06-04 LAB — CBC
MCH: 29.7 pg (ref 26.0–34.0)
MCHC: 33.3 g/dL (ref 32.0–36.0)
MCV: 89 fL (ref 80–100)
Platelet: 378 10*3/uL (ref 150–440)
RBC: 4.83 10*6/uL (ref 3.80–5.20)

## 2011-06-04 LAB — COMPREHENSIVE METABOLIC PANEL
Albumin: 4.1 g/dL (ref 3.4–5.0)
Anion Gap: 12 (ref 7–16)
BUN: 8 mg/dL (ref 7–18)
Chloride: 97 mmol/L — ABNORMAL LOW (ref 98–107)
Co2: 26 mmol/L (ref 21–32)
EGFR (African American): >60
Osmolality: 279 (ref 275–301)
Potassium: 3.9 mmol/L (ref 3.5–5.1)
SGOT(AST): 42 U/L — ABNORMAL HIGH (ref 15–37)
Sodium: 135 mmol/L — ABNORMAL LOW (ref 136–145)
Total Protein: 8.4 g/dL — ABNORMAL HIGH (ref 6.4–8.2)

## 2011-06-04 LAB — PROTIME-INR: Prothrombin Time: 13.3 secs (ref 11.5–14.7)

## 2011-06-04 LAB — TROPONIN I: Troponin-I: <0.02 ng/mL

## 2011-06-05 DIAGNOSIS — M545 Low back pain, unspecified: Secondary | ICD-10-CM | POA: Diagnosis not present

## 2011-06-05 DIAGNOSIS — IMO0002 Reserved for concepts with insufficient information to code with codable children: Secondary | ICD-10-CM | POA: Diagnosis not present

## 2011-06-10 ENCOUNTER — Other Ambulatory Visit (INDEPENDENT_AMBULATORY_CARE_PROVIDER_SITE_OTHER): Payer: Medicare Other | Admitting: *Deleted

## 2011-06-10 ENCOUNTER — Other Ambulatory Visit: Payer: Self-pay | Admitting: Internal Medicine

## 2011-06-10 DIAGNOSIS — E119 Type 2 diabetes mellitus without complications: Secondary | ICD-10-CM | POA: Diagnosis not present

## 2011-06-10 DIAGNOSIS — E039 Hypothyroidism, unspecified: Secondary | ICD-10-CM

## 2011-06-10 LAB — COMPREHENSIVE METABOLIC PANEL
AST: 22 U/L (ref 0–37)
Albumin: 4 g/dL (ref 3.5–5.2)
Alkaline Phosphatase: 53 U/L (ref 39–117)
Calcium: 9.2 mg/dL (ref 8.4–10.5)
Chloride: 96 mEq/L (ref 96–112)
Glucose, Bld: 280 mg/dL — ABNORMAL HIGH (ref 70–99)
Potassium: 4 mEq/L (ref 3.5–5.1)
Sodium: 135 mEq/L (ref 135–145)
Total Protein: 7.6 g/dL (ref 6.0–8.3)

## 2011-06-11 ENCOUNTER — Encounter: Payer: Self-pay | Admitting: Internal Medicine

## 2011-06-11 ENCOUNTER — Telehealth: Payer: Self-pay | Admitting: Internal Medicine

## 2011-06-11 ENCOUNTER — Ambulatory Visit (INDEPENDENT_AMBULATORY_CARE_PROVIDER_SITE_OTHER): Payer: Medicare Other | Admitting: Internal Medicine

## 2011-06-11 DIAGNOSIS — E119 Type 2 diabetes mellitus without complications: Secondary | ICD-10-CM

## 2011-06-11 DIAGNOSIS — M47817 Spondylosis without myelopathy or radiculopathy, lumbosacral region: Secondary | ICD-10-CM | POA: Diagnosis not present

## 2011-06-11 DIAGNOSIS — M5137 Other intervertebral disc degeneration, lumbosacral region: Secondary | ICD-10-CM | POA: Diagnosis not present

## 2011-06-11 DIAGNOSIS — E669 Obesity, unspecified: Secondary | ICD-10-CM | POA: Diagnosis not present

## 2011-06-11 DIAGNOSIS — IMO0002 Reserved for concepts with insufficient information to code with codable children: Secondary | ICD-10-CM | POA: Diagnosis not present

## 2011-06-11 DIAGNOSIS — M48062 Spinal stenosis, lumbar region with neurogenic claudication: Secondary | ICD-10-CM | POA: Diagnosis not present

## 2011-06-11 DIAGNOSIS — E079 Disorder of thyroid, unspecified: Secondary | ICD-10-CM

## 2011-06-11 MED ORDER — INSULIN DETEMIR 100 UNIT/ML ~~LOC~~ SOLN: 50.0000 [IU] | Freq: Two times a day (BID) | SUBCUTANEOUS | Status: DC

## 2011-06-11 NOTE — Telephone Encounter (Signed)
Pt wanted to remind you to send the paperwork to the [facility]  Dr Alva Garnet

## 2011-06-11 NOTE — Telephone Encounter (Signed)
Do we have the forms? She needs to bring them with her to her visit.

## 2011-06-11 NOTE — Assessment & Plan Note (Addendum)
Diabetes Mellitus type II, under poor control.    Plan:    1.  Rx changes: increase Levemir to 50units bid. 2.  Education: Reviewed 'ABCs' of diabetes management (respective goals in parentheses):  A1C (<7), blood pressure (<130/80), and cholesterol (LDL <100). 3.  Compliance at present is estimated to be poor. Efforts to improve compliance (if necessary) will be directed at dietary modifications: avoidance of processed carbohydrates and increased exercise. 4. Follow up: 1 month

## 2011-06-11 NOTE — Patient Instructions (Signed)
Keep calories <1500 daily.  Goal sodium 1000mg  daily.  Keep food diary.  Follow up 1 month.

## 2011-06-11 NOTE — Progress Notes (Addendum)
Subjective:    Patient ID: [name], female    DOB: 1945-10-13, 66 y.o.   MRN: [id number]  HPI 66 year old female with history of obesity and diabetes presents for followup. In regards to her obesity, she has not kept a food log. She reports trying to make dietary modifications including limiting intake of processed carbohydrates and high fat foods. She is also limiting intake of salt. She has lost 6 pounds over the last month. She has not been able to exercise because of severe back pain and ongoing treatment for back pain.  In regards to her diabetes, her blood sugars have been elevated in the setting of use of prednisone for her back pain. Her recent hemoglobin A1c was 8.7%. She notes that fasting sugars have been elevated as high as 300-400. She reports compliance with her Levemir. She denies any low blood sugars.  She is concerned about potential thyroid dysfunction leading to difficulty with weight loss. We reviewed recent labs which showed normal thyroid function, however she reports that she has been speaking with family members who had normal thyroid function based on labs but were found to be hypothyroid. She would like to set up referral to endocrinologist for further evaluation.  Outpatient Encounter Prescriptions as of 06/11/2011  Medication Sig Dispense Refill  . aspirin 81 MG tablet Take 81 mg by mouth daily.        . citalopram (CELEXA) 20 MG tablet Take 20 mg by mouth daily.        Marland Kitchen glimepiride (AMARYL) 4 MG tablet Take 1 tablet (4 mg total) by mouth 2 (two) times daily.  180 tablet  4  . insulin detemir (LEVEMIR) 100 UNIT/ML injection Inject 50 Units into the skin 2 (two) times daily.  10 mL  6  . lisinopril (PRINIVIL,ZESTRIL) 5 MG tablet Take 5 mg by mouth daily.        . metFORMIN (GLUCOPHAGE) 1000 MG tablet Take 1 tablet (1,000 mg total) by mouth 2 (two) times daily with a meal.  180 tablet  4  . nitroGLYCERIN (NITROSTAT) 0.4 MG SL tablet Place 0.4 mg under the tongue  every 5 (five) minutes as needed.        . pantoprazole (PROTONIX) 40 MG tablet Take 1 tablet (40 mg total) by mouth daily.  90 tablet  4    Review of Systems  Constitutional: Negative for fever, chills, appetite change, fatigue and unexpected weight change.  HENT: Negative for ear pain, congestion, sore throat, trouble swallowing, neck pain, voice change and sinus pressure.   Eyes: Negative for visual disturbance.  Respiratory: Negative for cough, shortness of breath, wheezing and stridor.   Cardiovascular: Negative for chest pain, palpitations and leg swelling.  Gastrointestinal: Negative for nausea, vomiting, abdominal pain, diarrhea, constipation, blood in stool, abdominal distention and anal bleeding.  Genitourinary: Negative for dysuria and flank pain.  Musculoskeletal: Positive for back pain. Negative for myalgias, arthralgias and gait problem.  Skin: Negative for color change and rash.  Neurological: Positive for numbness. Negative for dizziness and headaches.  Hematological: Negative for adenopathy. Does not bruise/bleed easily.  Psychiatric/Behavioral: Negative for suicidal ideas, sleep disturbance and dysphoric mood. The patient is not nervous/anxious.    BP 144/82  Pulse 96  Temp(Src) 98.1 F (36.7 C) (Oral)  Ht 5\' 2"  (1.575 m)  Wt 236 lb (107.049 kg)  BMI 43.17 kg/m2  SpO2 96%     Objective:   Physical Exam  Constitutional: She is oriented to person, place, and  time. She appears well-developed and well-nourished. No distress.  HENT:  Head: Normocephalic and atraumatic.  Right Ear: External ear normal.  Left Ear: External ear normal.  Nose: Nose normal.  Mouth/Throat: Oropharynx is clear and moist. No oropharyngeal exudate.  Eyes: Conjunctivae are normal. Pupils are equal, round, and reactive to light. Right eye exhibits no discharge. Left eye exhibits no discharge. No scleral icterus.  Neck: Normal range of motion. Neck supple. No tracheal deviation present. No  thyromegaly present.  Cardiovascular: Normal rate, regular rhythm, normal heart sounds and intact distal pulses.  Exam reveals no gallop and no friction rub.   No murmur heard. Pulmonary/Chest: Effort normal and breath sounds normal. No respiratory distress. She has no wheezes. She has no rales. She exhibits no tenderness.  Musculoskeletal: Normal range of motion. She exhibits no edema and no tenderness.  Lymphadenopathy:    She has no cervical adenopathy.  Neurological: She is alert and oriented to person, place, and time. No cranial nerve deficit. She exhibits normal muscle tone. Coordination normal.  Skin: Skin is warm and dry. No rash noted. She is not diaphoretic. No erythema. No pallor.  Psychiatric: She has a normal mood and affect. Her behavior is normal. Judgment and thought content normal.          Assessment & Plan:

## 2011-06-11 NOTE — Assessment & Plan Note (Signed)
Assessment:  Obesity with BMI and comorbidities including diabetes mellitus.  Plan:  1. Discussed proper diet (low fat, low sodium, high fiber) with patient and keeping a food log. Goal <1500 calories per day. 2. Discussed need for regular exercise (minumum of 3 times per week, 20 minutes per session) with patient. Limited at present because of back pain.   3. Pt will continue to follow up with Bariatric Surgery in evaluation for gastric bypass. 4. Follow up in 1 month and as needed.

## 2011-06-12 NOTE — Telephone Encounter (Signed)
Pt said you all send them every month

## 2011-06-20 DIAGNOSIS — F339 Major depressive disorder, recurrent, unspecified: Secondary | ICD-10-CM | POA: Diagnosis not present

## 2011-06-27 DIAGNOSIS — M545 Low back pain, unspecified: Secondary | ICD-10-CM | POA: Diagnosis not present

## 2011-06-27 DIAGNOSIS — IMO0002 Reserved for concepts with insufficient information to code with codable children: Secondary | ICD-10-CM | POA: Diagnosis not present

## 2011-07-07 ENCOUNTER — Ambulatory Visit (INDEPENDENT_AMBULATORY_CARE_PROVIDER_SITE_OTHER): Payer: Medicare Other | Admitting: Internal Medicine

## 2011-07-07 ENCOUNTER — Encounter: Payer: Self-pay | Admitting: Internal Medicine

## 2011-07-07 VITALS — BP 142/78 | HR 107 | Temp 97.9°F | Ht 62.0 in | Wt 237.0 lb

## 2011-07-07 DIAGNOSIS — J40 Bronchitis, not specified as acute or chronic: Secondary | ICD-10-CM | POA: Diagnosis not present

## 2011-07-07 DIAGNOSIS — E669 Obesity, unspecified: Secondary | ICD-10-CM

## 2011-07-07 DIAGNOSIS — IMO0001 Reserved for inherently not codable concepts without codable children: Secondary | ICD-10-CM | POA: Diagnosis not present

## 2011-07-07 DIAGNOSIS — E1165 Type 2 diabetes mellitus with hyperglycemia: Secondary | ICD-10-CM

## 2011-07-07 DIAGNOSIS — IMO0002 Reserved for concepts with insufficient information to code with codable children: Secondary | ICD-10-CM

## 2011-07-07 MED ORDER — AMOXICILLIN-POT CLAVULANATE 875-125 MG PO TABS: 1.0000 | ORAL_TABLET | Freq: Two times a day (BID) | ORAL | Status: AC

## 2011-07-07 MED ORDER — GUAIFENESIN-CODEINE 100-10 MG/5ML PO SYRP: 5.0000 mL | ORAL_SOLUTION | Freq: Two times a day (BID) | ORAL | Status: AC | PRN

## 2011-07-08 DIAGNOSIS — J40 Bronchitis, not specified as acute or chronic: Secondary | ICD-10-CM | POA: Insufficient documentation

## 2011-07-08 NOTE — Assessment & Plan Note (Signed)
Diabetes Mellitus type II, under poor control, however improved now that she is off steroids.   Plan:    1.  Rx changes: Continue Levemir 50units bid. 2.  Education: Reviewed 'ABCs' of diabetes management (respective goals in parentheses):  A1C (<7), blood pressure (<130/80), and cholesterol (LDL <100). 3.  Compliance at present is estimated to be fair. Efforts to improve compliance (if necessary) will be directed at dietary modifications: avoidance of processed carbohydrates and increased exercise. 4. Follow up: 1 month

## 2011-07-08 NOTE — Assessment & Plan Note (Signed)
Weight is unchanged. Encouraged her to continue with diet which is low in fat, low in sodium, and high in fiber. Encouraged her to keep a daily food log and set her goal of less than 1500 calories per day. Encouraged regular exercise with a goal of 20 minutes per session 3 days per week. She will continue to follow with bariatric surgery. Followup in one month.

## 2011-07-08 NOTE — Progress Notes (Signed)
Subjective:    Patient ID: [name], female    DOB: 1945-07-16, 66 y.o.   MRN: [id number]  HPI 66 year old female with history of diabetes, obesity presents for followup. In regards to her weight, she reports some effort at improving her dietary intake. She has not kept a food diary. She does not exercise. She reports that her weight was lower at home, reflecting about a 5 pound weight loss since her last visit.  In regards to her diabetes, she notes that her sugars are improved compared to last month. She is now off prednisone and sugars have been less than 250. She reports full compliance with her Levemir. She denies any low blood sugars less than 80.  She is concerned today about a 3-4 day history of nasal congestion, hoarseness, cough productive of purulent sputum. She has mild shortness of breath. She denies any chest pain, fever, chills. She notes that she has been caring for her grandchildren who are both sick. She has not been taking any medication for this with the exception of occasional Mucinex. She has no improvement with this.  Outpatient Encounter Prescriptions as of 07/07/2011  Medication Sig Dispense Refill  . aspirin 81 MG tablet Take 81 mg by mouth daily.        . citalopram (CELEXA) 20 MG tablet Take 20 mg by mouth daily.        Marland Kitchen glimepiride (AMARYL) 4 MG tablet Take 1 tablet (4 mg total) by mouth 2 (two) times daily.  180 tablet  4  . insulin detemir (LEVEMIR) 100 UNIT/ML injection Inject 50 Units into the skin 2 (two) times daily.  10 mL  6  . lisinopril (PRINIVIL,ZESTRIL) 5 MG tablet Take 5 mg by mouth daily.        . metFORMIN (GLUCOPHAGE) 1000 MG tablet Take 1 tablet (1,000 mg total) by mouth 2 (two) times daily with a meal.  180 tablet  4  . nitroGLYCERIN (NITROSTAT) 0.4 MG SL tablet Place 0.4 mg under the tongue every 5 (five) minutes as needed.        . pantoprazole (PROTONIX) 40 MG tablet Take 1 tablet (40 mg total) by mouth daily.  90 tablet  4  .  amoxicillin-clavulanate (AUGMENTIN) 875-125 MG per tablet Take 1 tablet by mouth 2 (two) times daily.  20 tablet  0  . guaiFENesin-codeine (ROBITUSSIN AC) 100-10 MG/5ML syrup Take 5 mLs by mouth 2 (two) times daily as needed for cough.  240 mL  0    Review of Systems  Constitutional: Negative for fever, chills, appetite change, fatigue and unexpected weight change.  HENT: Positive for congestion, rhinorrhea and postnasal drip. Negative for ear pain, sore throat, trouble swallowing, neck pain, voice change and sinus pressure.   Eyes: Negative for visual disturbance.  Respiratory: Positive for cough and shortness of breath. Negative for wheezing and stridor.   Cardiovascular: Negative for chest pain, palpitations and leg swelling.  Gastrointestinal: Negative for nausea, vomiting, abdominal pain, diarrhea, constipation, blood in stool, abdominal distention and anal bleeding.  Genitourinary: Negative for dysuria and flank pain.  Musculoskeletal: Negative for myalgias, arthralgias and gait problem.  Skin: Negative for color change and rash.  Neurological: Negative for dizziness and headaches.  Hematological: Negative for adenopathy. Does not bruise/bleed easily.  Psychiatric/Behavioral: Negative for suicidal ideas, sleep disturbance and dysphoric mood. The patient is not nervous/anxious.    BP 142/78  Pulse 107  Temp(Src) 97.9 F (36.6 C) (Oral)  Ht 5\' 2"  (1.575 m)  Wt 237 lb (107.502 kg)  BMI 43.35 kg/m2  SpO2 95%     Objective:   Physical Exam  Constitutional: She is oriented to person, place, and time. She appears well-developed and well-nourished. No distress.  HENT:  Head: Normocephalic and atraumatic.  Right Ear: External ear normal. No middle ear effusion.  Left Ear: External ear normal.  No middle ear effusion.  Nose: Nose normal.  Mouth/Throat: Oropharynx is clear and moist. No oropharyngeal exudate.  Eyes: Conjunctivae are normal. Pupils are equal, round, and reactive to  light. Right eye exhibits no discharge. Left eye exhibits no discharge. No scleral icterus.  Neck: Normal range of motion. Neck supple. No tracheal deviation present. No thyromegaly present.  Cardiovascular: Normal rate, regular rhythm, normal heart sounds and intact distal pulses.  Exam reveals no gallop and no friction rub.   No murmur heard. Pulmonary/Chest: Effort normal. No accessory muscle usage. Not tachypneic. No respiratory distress. She has no decreased breath sounds. She has no wheezes. She has rhonchi (few scattered). She has no rales. She exhibits no tenderness.  Musculoskeletal: Normal range of motion. She exhibits no edema and no tenderness.  Lymphadenopathy:    She has no cervical adenopathy.  Neurological: She is alert and oriented to person, place, and time. No cranial nerve deficit. She exhibits normal muscle tone. Coordination normal.  Skin: Skin is warm and dry. No rash noted. She is not diaphoretic. No erythema. No pallor.  Psychiatric: She has a normal mood and affect. Her behavior is normal. Judgment and thought content normal.          Assessment & Plan:

## 2011-07-08 NOTE — Assessment & Plan Note (Signed)
Symptoms most consistent with early bronchitis. We discussed that this is likely viral. Encouraged her to continue using over-the-counter medications including Mucinex and Tylenol. If symptoms are persistent she will start antibiotics with Augmentin. Prescription given today as she will be out of town over the next week. She will call if symptoms are not improving.

## 2011-07-11 ENCOUNTER — Other Ambulatory Visit: Payer: Self-pay | Admitting: Internal Medicine

## 2011-07-11 DIAGNOSIS — B3731 Acute candidiasis of vulva and vagina: Secondary | ICD-10-CM

## 2011-07-11 DIAGNOSIS — B373 Candidiasis of vulva and vagina: Secondary | ICD-10-CM

## 2011-07-11 MED ORDER — FLUCONAZOLE 150 MG PO TABS: 150.0000 mg | ORAL_TABLET | Freq: Once | ORAL | Status: AC

## 2011-07-17 ENCOUNTER — Ambulatory Visit: Payer: Medicare Other | Admitting: Internal Medicine

## 2011-07-17 DIAGNOSIS — IMO0001 Reserved for inherently not codable concepts without codable children: Secondary | ICD-10-CM | POA: Diagnosis not present

## 2011-07-17 DIAGNOSIS — R635 Abnormal weight gain: Secondary | ICD-10-CM | POA: Diagnosis not present

## 2011-07-22 DIAGNOSIS — F339 Major depressive disorder, recurrent, unspecified: Secondary | ICD-10-CM | POA: Diagnosis not present

## 2011-07-31 ENCOUNTER — Encounter: Payer: Self-pay | Admitting: Internal Medicine

## 2011-07-31 ENCOUNTER — Ambulatory Visit (INDEPENDENT_AMBULATORY_CARE_PROVIDER_SITE_OTHER): Payer: Medicare Other | Admitting: Internal Medicine

## 2011-07-31 VITALS — BP 122/62 | HR 81 | Temp 97.7°F | Ht 62.0 in | Wt 236.0 lb

## 2011-07-31 DIAGNOSIS — IMO0001 Reserved for inherently not codable concepts without codable children: Secondary | ICD-10-CM

## 2011-07-31 DIAGNOSIS — R0789 Other chest pain: Secondary | ICD-10-CM | POA: Diagnosis not present

## 2011-07-31 DIAGNOSIS — E669 Obesity, unspecified: Secondary | ICD-10-CM

## 2011-07-31 DIAGNOSIS — E1165 Type 2 diabetes mellitus with hyperglycemia: Secondary | ICD-10-CM

## 2011-07-31 DIAGNOSIS — IMO0002 Reserved for concepts with insufficient information to code with codable children: Secondary | ICD-10-CM

## 2011-07-31 NOTE — Assessment & Plan Note (Signed)
BG improved per pt report. Recent A1c was elevated at 11%.  Pt has stopped Victoza because of worry about thyroid cancer. Encouraged her to find out what form of thyroid cancer her brother had, so we can determine if necessary to stop Victoza. Plan to continue Levemir. Repeat A1c in 09/2011.

## 2011-07-31 NOTE — Assessment & Plan Note (Signed)
Symptoms and exam consistent with costochrondritis.  Will treat with ibuprofen prn.  If symptoms persist or worsen, then she will call. Will get records on stress test from [facility].

## 2011-07-31 NOTE — Progress Notes (Signed)
Subjective:    Patient ID: [name], female    DOB: [birth year], 66 y.o.   MRN: [id number]  HPI 66YO female with DM, obesity presents for follow up. In regards to DM, she has stopped taking her Victoza because of concern about risk of thyroid cancer. She continues on Levemir. BG have been well controlled with recent fasting BG near 120.  In regards to obesity, pt has not started to record her food intake. Her weight has been unchanged for several months.  She does not exercise.  She tries to make healthy food choices.  She is still debating about gastric bypass surgery.  She is also concerned today about several weeks of left sided chest pain.  Pain is present both at rest and on exertion.  It resolves without any intervention.  It is not changed with movement.  It is not worsened with exertion.  No associated diaphoresis, nausea, dyspnea. She recently had myoview at [facility] which was abnormal, but then had adenosine stress test at [facility] which she reports was normal.    Outpatient Encounter Prescriptions as of 07/31/2011  Medication Sig Dispense Refill  . aspirin 81 MG tablet Take 81 mg by mouth daily.        . citalopram (CELEXA) 20 MG tablet Take 20 mg by mouth daily.        Marland Kitchen glimepiride (AMARYL) 4 MG tablet Take 1 tablet (4 mg total) by mouth 2 (two) times daily.  180 tablet  4  . insulin detemir (LEVEMIR) 100 UNIT/ML injection Inject 50 Units into the skin 2 (two) times daily.  10 mL  6  . metFORMIN (GLUCOPHAGE) 1000 MG tablet Take 1 tablet (1,000 mg total) by mouth 2 (two) times daily with a meal.  180 tablet  4  . nitroGLYCERIN (NITROSTAT) 0.4 MG SL tablet Place 0.4 mg under the tongue every 5 (five) minutes as needed.        Marland Kitchen lisinopril (PRINIVIL,ZESTRIL) 5 MG tablet Take 5 mg by mouth daily.        . pantoprazole (PROTONIX) 40 MG tablet Take 1 tablet (40 mg total) by mouth daily.  90 tablet  4    Review of Systems  Constitutional: Negative for fever, chills, appetite change,  fatigue and unexpected weight change.  HENT: Negative for ear pain, congestion, sore throat, trouble swallowing, neck pain, voice change and sinus pressure.   Eyes: Negative for visual disturbance.  Respiratory: Negative for cough, shortness of breath, wheezing and stridor.   Cardiovascular: Positive for chest pain. Negative for palpitations and leg swelling.  Gastrointestinal: Negative for nausea, vomiting, abdominal pain, diarrhea, constipation, blood in stool, abdominal distention and anal bleeding.  Genitourinary: Negative for dysuria and flank pain.  Musculoskeletal: Negative for myalgias, arthralgias and gait problem.  Skin: Negative for color change and rash.  Neurological: Negative for dizziness and headaches.  Hematological: Negative for adenopathy. Does not bruise/bleed easily.  Psychiatric/Behavioral: Negative for suicidal ideas, sleep disturbance and dysphoric mood. The patient is not nervous/anxious.    BP 122/62  Pulse 81  Temp(Src) 97.7 F (36.5 C) (Oral)  Ht 5\' 2"  (1.575 m)  Wt 236 lb (107.049 kg)  BMI 43.17 kg/m2  SpO2 97%     Objective:   Physical Exam  Constitutional: She is oriented to person, place, and time. She appears well-developed and well-nourished. No distress.  HENT:  Head: Normocephalic and atraumatic.  Right Ear: External ear normal.  Left Ear: External ear normal.  Nose: Nose normal.  Mouth/Throat: Oropharynx is clear and moist. No oropharyngeal exudate.  Eyes: Conjunctivae are normal. Pupils are equal, round, and reactive to light. Right eye exhibits no discharge. Left eye exhibits no discharge. No scleral icterus.  Neck: Normal range of motion. Neck supple. No tracheal deviation present. No thyromegaly present.  Cardiovascular: Normal rate, regular rhythm, normal heart sounds and intact distal pulses.  Exam reveals no gallop and no friction rub.   No murmur heard. Pulmonary/Chest: Effort normal and breath sounds normal. No respiratory distress.  She has no wheezes. She has no rales. She exhibits tenderness (left sternal border).  Musculoskeletal: Normal range of motion. She exhibits no edema and no tenderness.  Lymphadenopathy:    She has no cervical adenopathy.  Neurological: She is alert and oriented to person, place, and time. No cranial nerve deficit. She exhibits normal muscle tone. Coordination normal.  Skin: Skin is warm and dry. No rash noted. She is not diaphoretic. No erythema. No pallor.  Psychiatric: She has a normal mood and affect. Her behavior is normal. Judgment and thought content normal.          Assessment & Plan:

## 2011-07-31 NOTE — Assessment & Plan Note (Signed)
Weight is unchanged.  Pt has not kept a food diary. Strongly encouraged her to do this.  She does not exercise, encouraged her to do this. Goal calories <1500 daily. Goal exercise 3 days per week.

## 2011-08-05 ENCOUNTER — Ambulatory Visit: Payer: Medicare Other | Admitting: Internal Medicine

## 2011-08-20 DIAGNOSIS — F339 Major depressive disorder, recurrent, unspecified: Secondary | ICD-10-CM | POA: Diagnosis not present

## 2011-09-09 ENCOUNTER — Ambulatory Visit (INDEPENDENT_AMBULATORY_CARE_PROVIDER_SITE_OTHER): Payer: Medicare Other | Admitting: Internal Medicine

## 2011-09-09 ENCOUNTER — Encounter: Payer: Self-pay | Admitting: Internal Medicine

## 2011-09-09 VITALS — BP 118/80 | HR 78 | Temp 98.5°F | Resp 16 | Wt 235.5 lb

## 2011-09-09 DIAGNOSIS — IMO0002 Reserved for concepts with insufficient information to code with codable children: Secondary | ICD-10-CM

## 2011-09-09 DIAGNOSIS — Z1239 Encounter for other screening for malignant neoplasm of breast: Secondary | ICD-10-CM

## 2011-09-09 DIAGNOSIS — E669 Obesity, unspecified: Secondary | ICD-10-CM

## 2011-09-09 DIAGNOSIS — IMO0001 Reserved for inherently not codable concepts without codable children: Secondary | ICD-10-CM | POA: Diagnosis not present

## 2011-09-09 DIAGNOSIS — E1165 Type 2 diabetes mellitus with hyperglycemia: Secondary | ICD-10-CM

## 2011-09-09 MED ORDER — PHENTERMINE HCL 37.5 MG PO CAPS: 37.5000 mg | ORAL_CAPSULE | ORAL | Status: DC

## 2011-09-09 NOTE — Assessment & Plan Note (Signed)
Per patient report, blood sugars have been elevated. However, she did not bring a record of her sugars today. Encouraged her to avoid intake of sweet beverages such as grapefruit juice. Encouraged her to keep a record of her blood sugars. Will plan to repeat hemoglobin A1c in May 2013.

## 2011-09-09 NOTE — Progress Notes (Signed)
Subjective:    Patient ID: [name], female    DOB: February 16, 1946, 66 y.o.   MRN: [id number]  HPI 66 year old female with history of diabetes, bipolar disorder, and obesity presents for followup. She has not lost any weight since her last visit. She has not kept a food diary. She reports increased intake of a grapefruit juice, because she had read that grapefruit juice would help with weight loss. She has not been physically active. She reports that her sugars have been "elevated" but she did not bring a record today. She is concerned about inability to lose weight. She is also concerned about some numbness in both of her legs. This is been present for several weeks. She attributes this to her diabetes.  Outpatient Encounter Prescriptions as of 09/09/2011  Medication Sig Dispense Refill  . aspirin 81 MG tablet Take 81 mg by mouth daily.        . citalopram (CELEXA) 20 MG tablet Take 20 mg by mouth daily.        Marland Kitchen glimepiride (AMARYL) 4 MG tablet Take 1 tablet (4 mg total) by mouth 2 (two) times daily.  180 tablet  4  . insulin detemir (LEVEMIR) 100 UNIT/ML injection Inject 50 Units into the skin 2 (two) times daily.  10 mL  6  . metFORMIN (GLUCOPHAGE) 1000 MG tablet Take 1 tablet (1,000 mg total) by mouth 2 (two) times daily with a meal.  180 tablet  4  . nitroGLYCERIN (NITROSTAT) 0.4 MG SL tablet Place 0.4 mg under the tongue every 5 (five) minutes as needed.        Marland Kitchen lisinopril (PRINIVIL,ZESTRIL) 5 MG tablet Take 5 mg by mouth daily.        . pantoprazole (PROTONIX) 40 MG tablet Take 1 tablet (40 mg total) by mouth daily.  90 tablet  4  . phentermine 37.5 MG capsule Take 1 capsule (37.5 mg total) by mouth every morning.  21 capsule  0   BP 118/80  Pulse 78  Temp(Src) 98.5 F (36.9 C) (Oral)  Resp 16  Wt 235 lb 8 oz (106.822 kg)  Review of Systems  Constitutional: Negative for fever, chills, appetite change, fatigue and unexpected weight change.  HENT: Negative for ear pain, congestion,  sore throat, trouble swallowing, neck pain, voice change and sinus pressure.   Eyes: Negative for visual disturbance.  Respiratory: Negative for cough, shortness of breath, wheezing and stridor.   Cardiovascular: Negative for chest pain, palpitations and leg swelling.  Gastrointestinal: Negative for nausea, vomiting, abdominal pain, diarrhea, constipation, blood in stool, abdominal distention and anal bleeding.  Genitourinary: Negative for dysuria and flank pain.  Musculoskeletal: Negative for myalgias, arthralgias and gait problem.  Skin: Negative for color change and rash.  Neurological: Positive for numbness. Negative for dizziness and headaches.  Hematological: Negative for adenopathy. Does not bruise/bleed easily.  Psychiatric/Behavioral: Negative for suicidal ideas, sleep disturbance and dysphoric mood. The patient is not nervous/anxious.        Objective:   Physical Exam  Constitutional: She is oriented to person, place, and time. She appears well-developed and well-nourished. No distress.  HENT:  Head: Normocephalic and atraumatic.  Right Ear: External ear normal.  Left Ear: External ear normal.  Nose: Nose normal.  Mouth/Throat: Oropharynx is clear and moist. No oropharyngeal exudate.  Eyes: Conjunctivae are normal. Pupils are equal, round, and reactive to light. Right eye exhibits no discharge. Left eye exhibits no discharge. No scleral icterus.  Neck: Normal range of motion.  Neck supple. No tracheal deviation present. No thyromegaly present.  Cardiovascular: Normal rate, regular rhythm, normal heart sounds and intact distal pulses.  Exam reveals no gallop and no friction rub.   No murmur heard. Pulmonary/Chest: Effort normal and breath sounds normal. No respiratory distress. She has no wheezes. She has no rales. She exhibits no tenderness.  Musculoskeletal: Normal range of motion. She exhibits no edema and no tenderness.  Lymphadenopathy:    She has no cervical adenopathy.    Neurological: She is alert and oriented to person, place, and time. No cranial nerve deficit. She exhibits normal muscle tone. Coordination normal.  Skin: Skin is warm and dry. No rash noted. She is not diaphoretic. No erythema. No pallor.  Psychiatric: She has a normal mood and affect. Her behavior is normal. Judgment and thought content normal.          Assessment & Plan:

## 2011-09-09 NOTE — Assessment & Plan Note (Signed)
Weight is unchanged.  Pt has not kept a food diary. Strongly encouraged her to do this.  She does not exercise, encouraged her to do this. Goal calories <1500 daily. Goal exercise 3 days per week. Will start phentermine to help with appetite suppression. Discussed potential risks/benefits of this medication. She will call if any problems. Follow up 1 month and prn.

## 2011-09-29 ENCOUNTER — Ambulatory Visit: Payer: Self-pay | Admitting: Internal Medicine

## 2011-09-29 ENCOUNTER — Encounter: Payer: Self-pay | Admitting: Internal Medicine

## 2011-09-29 ENCOUNTER — Ambulatory Visit (INDEPENDENT_AMBULATORY_CARE_PROVIDER_SITE_OTHER): Payer: Medicare Other | Admitting: Internal Medicine

## 2011-09-29 VITALS — BP 134/81 | HR 77 | Temp 98.1°F | Resp 16 | Wt 237.8 lb

## 2011-09-29 DIAGNOSIS — M79609 Pain in unspecified limb: Secondary | ICD-10-CM | POA: Diagnosis not present

## 2011-09-29 DIAGNOSIS — N644 Mastodynia: Secondary | ICD-10-CM | POA: Diagnosis not present

## 2011-09-29 DIAGNOSIS — M7989 Other specified soft tissue disorders: Secondary | ICD-10-CM

## 2011-09-29 NOTE — Assessment & Plan Note (Signed)
Pain seems most consistent with costochondritis, however given new left arm swelling, malignancy also a consideration. No palpable masses noted on breast exam. Will get diagnostic mammogram for further evaluation.

## 2011-09-29 NOTE — Assessment & Plan Note (Signed)
Left arm swelling x 2 days. Will get [place] of LUE to eval for DVT. If negative, would favor vascular evaluation, given h/o PICC line in past question central venous scarring.

## 2011-09-29 NOTE — Progress Notes (Signed)
Subjective:    Patient ID: [name], female    DOB: 03-Nov-1945, 66 y.o.   MRN: [id number]  HPI 66 year old female presents for acute visit complaining of two-day history of left arm swelling and several week history of left breast pain. Left arm swelling was first noticed while she was attending a wedding this weekend. She reports that her hand became so swollen that she had difficulty wearing her rings. The swelling has improved somewhat. She denies any shortness of breath. She denies any color change in her skin. She has also had left breast tenderness and pain the last several weeks. Initially, it was thought she may have pulled a muscle, but pain has been persistent. She has not noted any masses or change in the overlying breast tissue. She denies skin changes. She has not had any discharge from her nipple.  Outpatient Encounter Prescriptions as of 09/29/2011  Medication Sig Dispense Refill  . aspirin 81 MG tablet Take 81 mg by mouth daily.        . citalopram (CELEXA) 20 MG tablet Take 20 mg by mouth daily.        Marland Kitchen glimepiride (AMARYL) 4 MG tablet Take 1 tablet (4 mg total) by mouth 2 (two) times daily.  180 tablet  4  . insulin detemir (LEVEMIR) 100 UNIT/ML injection Inject 50 Units into the skin 2 (two) times daily.  10 mL  6  . metFORMIN (GLUCOPHAGE) 1000 MG tablet Take 1 tablet (1,000 mg total) by mouth 2 (two) times daily with a meal.  180 tablet  4  . nitroGLYCERIN (NITROSTAT) 0.4 MG SL tablet Place 0.4 mg under the tongue every 5 (five) minutes as needed.        . phentermine 37.5 MG capsule Take 1 capsule (37.5 mg total) by mouth every morning.  21 capsule  0  . lisinopril (PRINIVIL,ZESTRIL) 5 MG tablet Take 5 mg by mouth daily.        . pantoprazole (PROTONIX) 40 MG tablet Take 1 tablet (40 mg total) by mouth daily.  90 tablet  4    Review of Systems  Constitutional: Negative for fever, chills, appetite change, fatigue and unexpected weight change.  HENT: Negative for neck  pain.   Eyes: Negative for visual disturbance.  Respiratory: Negative for cough, shortness of breath, wheezing and stridor.   Cardiovascular: Positive for chest pain. Negative for palpitations and leg swelling.  Gastrointestinal: Negative for abdominal pain.  Genitourinary: Negative for dysuria and flank pain.  Musculoskeletal: Negative for myalgias, arthralgias and gait problem.  Skin: Negative for color change and rash.  Neurological: Negative for dizziness and headaches.  Hematological: Negative for adenopathy. Does not bruise/bleed easily.  Psychiatric/Behavioral: Negative for suicidal ideas, sleep disturbance and dysphoric mood. The patient is not nervous/anxious.    BP 134/81  Pulse 77  Temp(Src) 98.1 F (36.7 C) (Oral)  Resp 16  Wt 237 lb 12 oz (107.843 kg)  SpO2 98%     Objective:   Physical Exam  Constitutional: She is oriented to person, place, and time. She appears well-developed and well-nourished. No distress.  HENT:  Head: Normocephalic and atraumatic.  Right Ear: External ear normal.  Left Ear: External ear normal.  Nose: Nose normal.  Mouth/Throat: Oropharynx is clear and moist. No oropharyngeal exudate.  Eyes: Conjunctivae are normal. Pupils are equal, round, and reactive to light. Right eye exhibits no discharge. Left eye exhibits no discharge. No scleral icterus.  Neck: Normal range of motion. Neck supple. No  tracheal deviation present. No thyromegaly present.  Pulmonary/Chest: Effort normal. Right breast exhibits no inverted nipple, no mass, no nipple discharge, no skin change and no tenderness. Left breast exhibits tenderness. Left breast exhibits no inverted nipple, no mass, no nipple discharge and no skin change. Breasts are symmetrical.  Musculoskeletal: Normal range of motion. She exhibits no edema and no tenderness.       Right upper arm: She exhibits edema (slight compared to right upper arm, non-pitting). She exhibits no tenderness and no deformity.    Lymphadenopathy:    She has no cervical adenopathy.  Neurological: She is alert and oriented to person, place, and time. No cranial nerve deficit. She exhibits normal muscle tone. Coordination normal.  Skin: Skin is warm and dry. No rash noted. She is not diaphoretic. No erythema. No pallor.  Psychiatric: She has a normal mood and affect. Her behavior is normal. Judgment and thought content normal.          Assessment & Plan:

## 2011-10-01 ENCOUNTER — Ambulatory Visit: Payer: Medicare Other | Admitting: Internal Medicine

## 2011-10-06 ENCOUNTER — Ambulatory Visit: Payer: Self-pay | Admitting: Internal Medicine

## 2011-10-06 DIAGNOSIS — N644 Mastodynia: Secondary | ICD-10-CM | POA: Diagnosis not present

## 2011-10-08 ENCOUNTER — Telehealth: Payer: Self-pay | Admitting: Internal Medicine

## 2011-10-08 NOTE — Telephone Encounter (Signed)
Mammogram and [place] of breast are both normal.  However, if pain is persistent, we can set up pt for surgical evaluation for second opinion.

## 2011-10-08 NOTE — Telephone Encounter (Signed)
Patient notified. She says that she is not interested in surgical eval at this time, but will discuss it with Dr. Dan Humphreys at her upcoming appt.

## 2011-10-08 NOTE — Telephone Encounter (Signed)
Patient called requesting results, I advised that someone should call her this afternoon with those results.

## 2011-10-09 ENCOUNTER — Encounter: Payer: Self-pay | Admitting: Internal Medicine

## 2011-10-13 ENCOUNTER — Ambulatory Visit (INDEPENDENT_AMBULATORY_CARE_PROVIDER_SITE_OTHER): Payer: Medicare Other | Admitting: Internal Medicine

## 2011-10-13 ENCOUNTER — Other Ambulatory Visit: Payer: Self-pay | Admitting: *Deleted

## 2011-10-13 ENCOUNTER — Encounter: Payer: Self-pay | Admitting: Internal Medicine

## 2011-10-13 VITALS — BP 140/80 | HR 81 | Temp 98.5°F | Ht 62.0 in | Wt 236.5 lb

## 2011-10-13 DIAGNOSIS — Z9119 Patient's noncompliance with other medical treatment and regimen: Secondary | ICD-10-CM

## 2011-10-13 DIAGNOSIS — R071 Chest pain on breathing: Secondary | ICD-10-CM

## 2011-10-13 DIAGNOSIS — IMO0002 Reserved for concepts with insufficient information to code with codable children: Secondary | ICD-10-CM

## 2011-10-13 DIAGNOSIS — E1165 Type 2 diabetes mellitus with hyperglycemia: Secondary | ICD-10-CM

## 2011-10-13 DIAGNOSIS — IMO0001 Reserved for inherently not codable concepts without codable children: Secondary | ICD-10-CM | POA: Diagnosis not present

## 2011-10-13 DIAGNOSIS — Z91199 Patient's noncompliance with other medical treatment and regimen due to unspecified reason: Secondary | ICD-10-CM | POA: Insufficient documentation

## 2011-10-13 DIAGNOSIS — M7989 Other specified soft tissue disorders: Secondary | ICD-10-CM

## 2011-10-13 DIAGNOSIS — R0789 Other chest pain: Secondary | ICD-10-CM

## 2011-10-13 DIAGNOSIS — D510 Vitamin B12 deficiency anemia due to intrinsic factor deficiency: Secondary | ICD-10-CM

## 2011-10-13 DIAGNOSIS — E669 Obesity, unspecified: Secondary | ICD-10-CM

## 2011-10-13 DIAGNOSIS — E538 Deficiency of other specified B group vitamins: Secondary | ICD-10-CM

## 2011-10-13 LAB — COMPREHENSIVE METABOLIC PANEL
ALT: 17 U/L (ref 0–35)
AST: 21 U/L (ref 0–37)
Albumin: 3.7 g/dL (ref 3.5–5.2)
Alkaline Phosphatase: 65 U/L (ref 39–117)
Potassium: 3.7 mEq/L (ref 3.5–5.1)
Sodium: 139 mEq/L (ref 135–145)
Total Protein: 7.2 g/dL (ref 6.0–8.3)

## 2011-10-13 LAB — VITAMIN B12: Vitamin B-12: 347 pg/mL (ref 211–911)

## 2011-10-13 MED ORDER — CYANOCOBALAMIN 1000 MCG/ML IJ SOLN
1000.0000 ug | Freq: Once | INTRAMUSCULAR | Status: AC
  Administered 2011-10-13: 1000 ug via INTRAMUSCULAR

## 2011-10-13 MED ORDER — INSULIN DETEMIR 100 UNIT/ML ~~LOC~~ SOLN: 50.0000 [IU] | Freq: Two times a day (BID) | SUBCUTANEOUS | Status: DC

## 2011-10-13 MED ORDER — PHENTERMINE HCL 37.5 MG PO CAPS: 37.5000 mg | ORAL_CAPSULE | ORAL | Status: DC

## 2011-10-13 NOTE — Assessment & Plan Note (Signed)
Patient has not been compliant with dietary and exercise recommendations. Her diabetes is poorly controlled. Encouraged better compliance with diet and increase efforts at physical activity.

## 2011-10-13 NOTE — Progress Notes (Signed)
Subjective:    Patient ID: [name], female    DOB: [birth year], 66 y.o.   MRN: [id number]  HPI 66 year old female with history of diabetes, hypertension, obesity presents for followup. Her primary concern today is several month history of left-sided chest pain. This pain was described as superficial and there was a concern about possible breast abnormality leading to pain and swelling in the left arm. She underwent breast exam which was normal. She then had diagnostic mammogram which was also normal. She had left upper extremity Doppler which showed no evidence of DVT. She also had cardiac workup including cardiac cath which was normal. She continues to have left-sided chest wall tenderness and pleuritic left-sided chest pain with intermittent swelling of her left arm. She denies any fever, chills, cough, shortness of breath. She does have a history in the distant past of having a PICC line in her left arm and questions whether this may have led to scarring or other abnormality.  In regards to her diabetes, she reports poor compliance with diet. She has also not been taking her full dose of Levemir. She had stopped using victoza because of concern about thyroid cancer given her brothers history of thyroid cancer. However, she is uncertain what form of thyroid cancer her brother had. She does not exercise. She reports recent dietary indiscretion. She reports that when she was using phentermine she had better control of her appetite, however recently has been not this medication. She is concerned about worsening burning in her lower extremities. This is been present for several months. She questions whether this may be secondary to her diabetes versus B12 deficiency.  Outpatient Encounter Prescriptions as of 10/13/2011  Medication Sig Dispense Refill  . aspirin 81 MG tablet Take 81 mg by mouth daily.        . citalopram (CELEXA) 20 MG tablet Take 20 mg by mouth daily.        Marland Kitchen glimepiride (AMARYL) 4 MG  tablet Take 1 tablet (4 mg total) by mouth 2 (two) times daily.  180 tablet  4  . insulin detemir (LEVEMIR) 100 UNIT/ML injection Inject 50 Units into the skin 2 (two) times daily.  10 mL  6  . lisinopril (PRINIVIL,ZESTRIL) 5 MG tablet Take 5 mg by mouth daily.        . metFORMIN (GLUCOPHAGE) 1000 MG tablet Take 1 tablet (1,000 mg total) by mouth 2 (two) times daily with a meal.  180 tablet  4  . nitroGLYCERIN (NITROSTAT) 0.4 MG SL tablet Place 0.4 mg under the tongue every 5 (five) minutes as needed.        . pantoprazole (PROTONIX) 40 MG tablet Take 1 tablet (40 mg total) by mouth daily.  90 tablet  4  . DISCONTD: insulin detemir (LEVEMIR) 100 UNIT/ML injection Inject 50 Units into the skin 2 (two) times daily.  10 mL  6  . phentermine 37.5 MG capsule Take 1 capsule (37.5 mg total) by mouth every morning.  30 capsule  0  . DISCONTD: phentermine 37.5 MG capsule Take 1 capsule (37.5 mg total) by mouth every morning.  21 capsule  0   BP 140/80  Pulse 81  Temp 98.5 F (36.9 C)  Ht 5\' 2"  (1.575 m)  Wt 236 lb 8 oz (107.276 kg)  BMI 43.26 kg/m2  SpO2 94%   Review of Systems  Constitutional: Negative for fever, chills, appetite change, fatigue and unexpected weight change.  HENT: Negative for ear pain, congestion, sore throat,  trouble swallowing, neck pain, voice change and sinus pressure.   Eyes: Negative for visual disturbance.  Respiratory: Negative for cough, shortness of breath, wheezing and stridor.   Cardiovascular: Positive for chest pain. Negative for palpitations and leg swelling.  Gastrointestinal: Negative for nausea, vomiting, abdominal pain, diarrhea, constipation, blood in stool, abdominal distention and anal bleeding.  Genitourinary: Negative for dysuria and flank pain.  Musculoskeletal: Positive for myalgias. Negative for arthralgias and gait problem.  Skin: Negative for color change and rash.  Neurological: Negative for dizziness and headaches.  Hematological: Negative for  adenopathy. Does not bruise/bleed easily.  Psychiatric/Behavioral: Negative for suicidal ideas, sleep disturbance and dysphoric mood. The patient is not nervous/anxious.        Objective:   Physical Exam  Constitutional: She is oriented to person, place, and time. She appears well-developed and well-nourished. No distress.  HENT:  Head: Normocephalic and atraumatic.  Right Ear: External ear normal.  Left Ear: External ear normal.  Nose: Nose normal.  Mouth/Throat: Oropharynx is clear and moist. No oropharyngeal exudate.  Eyes: Conjunctivae are normal. Pupils are equal, round, and reactive to light. Right eye exhibits no discharge. Left eye exhibits no discharge. No scleral icterus.  Neck: Normal range of motion. Neck supple. No tracheal deviation present. No thyromegaly present.  Cardiovascular: Normal rate, regular rhythm, normal heart sounds and intact distal pulses.  Exam reveals no gallop and no friction rub.   No murmur heard. Pulmonary/Chest: Effort normal and breath sounds normal. No respiratory distress. She has no wheezes. She has no rales. She exhibits no tenderness.  Musculoskeletal: Normal range of motion. She exhibits no edema and no tenderness.  Lymphadenopathy:    She has no cervical adenopathy.  Neurological: She is alert and oriented to person, place, and time. No cranial nerve deficit. She exhibits normal muscle tone. Coordination normal.  Skin: Skin is warm and dry. No rash noted. She is not diaphoretic. No erythema. No pallor.  Psychiatric: She has a normal mood and affect. Her behavior is normal. Judgment and thought content normal.          Assessment & Plan:

## 2011-10-13 NOTE — Assessment & Plan Note (Addendum)
BMI 43. Patient's weight has not changed over the last year. She reports that she had some improvement with the use of phentermine, however has been off this medication for the last week. She would like to restart again. We discussed that this medication can cause elevated blood pressure and heart rate. Will get another month for child but has no weight loss we'll discontinue. Encouraged improved efforts at diet and increase physical activity.

## 2011-10-13 NOTE — Telephone Encounter (Signed)
Patient has an appt for a CT scan and is requesting Prednisone because she breaks out for the contrast dye.  Uses [facility].  Please advise.

## 2011-10-13 NOTE — Assessment & Plan Note (Signed)
Patient describes left-sided chest pain and has tenderness on palpation over her left anterior chest on exam. She also has some pleuritic component to her chest pain. Evaluation to date including mammogram has been unrevealing. Will get CT of the chest for further evaluation. She has some persistent swelling in her left upper extremity, and question whether she may have pulmonary emboli leading to pleuritic chest pain.

## 2011-10-13 NOTE — Telephone Encounter (Signed)
Need to ask radiology their exact protocol prior to calling in.

## 2011-10-13 NOTE — Assessment & Plan Note (Signed)
Blood glucose continues to be elevated per patient. She has increased her Levemir back to 50 units in the morning and 60 at night. Will check A1c with labs today.

## 2011-10-13 NOTE — Assessment & Plan Note (Addendum)
Patient with left arm swelling. Evaluation including ultrasound of the veins in the left upper extremity and mammogram have been unremarkable. She has a history in the distant past of a PICC line in this arm. Question if she may have more central obstruction leading to intermittent swelling. Will get vascular consult. Question if she might benefit from venogram for further evaluation.

## 2011-10-14 ENCOUNTER — Encounter: Payer: Self-pay | Admitting: Internal Medicine

## 2011-10-14 NOTE — Telephone Encounter (Signed)
Radiology needs to fax [place] their protocol for prednisone so that we can call in.

## 2011-10-15 MED ORDER — PREDNISONE 50 MG PO TABS: ORAL_TABLET | ORAL | Status: AC

## 2011-10-15 MED ORDER — DIPHENHYDRAMINE HCL 25 MG PO TABS: 50.0000 mg | ORAL_TABLET | Freq: Once | ORAL | Status: DC

## 2011-10-15 MED ORDER — RANITIDINE HCL 150 MG PO TABS: 150.0000 mg | ORAL_TABLET | Freq: Once | ORAL | Status: DC

## 2011-10-15 NOTE — Telephone Encounter (Signed)
Can you ask her to fax over the protocol? Thanks

## 2011-10-15 NOTE — Telephone Encounter (Signed)
Rx called to [facility]

## 2011-10-15 NOTE — Telephone Encounter (Signed)
Spoke with Sheralyn Boatman and she will fax protochol to [phone].

## 2011-10-15 NOTE — Telephone Encounter (Signed)
Spoke with Sheralyn Boatman, CT tech with [facility] and she stated that if patient has allergy/intolerance to the contrast dye then the protochol is Zantac, Prednisone, and Benadryl but she didn't give any specific instructions.

## 2011-10-16 ENCOUNTER — Telehealth: Payer: Self-pay | Admitting: *Deleted

## 2011-10-16 ENCOUNTER — Ambulatory Visit: Payer: Self-pay | Admitting: Internal Medicine

## 2011-10-16 DIAGNOSIS — J9819 Other pulmonary collapse: Secondary | ICD-10-CM | POA: Diagnosis not present

## 2011-10-16 DIAGNOSIS — R079 Chest pain, unspecified: Secondary | ICD-10-CM | POA: Diagnosis not present

## 2011-10-16 DIAGNOSIS — M7989 Other specified soft tissue disorders: Secondary | ICD-10-CM | POA: Diagnosis not present

## 2011-10-16 NOTE — Telephone Encounter (Signed)
Message copied by Jobie Quaker on Thu Oct 16, 2011  8:23 AM ------      Message from: Ronna Polio A      Created: Tue Oct 14, 2011  8:58 AM      Regarding: RE: labs       Yes please.      ----- Message -----         From: Jobie Quaker, CMA         Sent: 10/14/2011   8:15 AM           To: Shelia Media, MD      Subject: RE: labs                                                 It can not be added, it requires a different tube. Do you want her to come back in?       ----- Message -----         From: Shelia Media, MD         Sent: 10/13/2011   7:22 PM           To: Jobie Quaker, CMA      Subject: labs                                                     Can you add an A1c onto her labs from yesterday?

## 2011-10-16 NOTE — Telephone Encounter (Signed)
Tried calling, left message asking patient to call back.

## 2011-10-17 ENCOUNTER — Telehealth: Payer: Self-pay | Admitting: Internal Medicine

## 2011-10-17 NOTE — Telephone Encounter (Signed)
CT chest was normal.

## 2011-10-17 NOTE — Telephone Encounter (Signed)
Patient notified

## 2011-10-20 NOTE — Telephone Encounter (Signed)
Patient advised. She says that she has a lot going on right now and wants to wait until she comes back in to see Dr. Dan Humphreys.

## 2011-10-21 ENCOUNTER — Ambulatory Visit (INDEPENDENT_AMBULATORY_CARE_PROVIDER_SITE_OTHER): Payer: Medicare Other | Admitting: Internal Medicine

## 2011-10-21 DIAGNOSIS — I824Y9 Acute embolism and thrombosis of unspecified deep veins of unspecified proximal lower extremity: Secondary | ICD-10-CM | POA: Diagnosis not present

## 2011-10-21 DIAGNOSIS — M79609 Pain in unspecified limb: Secondary | ICD-10-CM | POA: Diagnosis not present

## 2011-10-21 DIAGNOSIS — E538 Deficiency of other specified B group vitamins: Secondary | ICD-10-CM | POA: Diagnosis not present

## 2011-10-21 DIAGNOSIS — M7989 Other specified soft tissue disorders: Secondary | ICD-10-CM | POA: Diagnosis not present

## 2011-10-21 DIAGNOSIS — E119 Type 2 diabetes mellitus without complications: Secondary | ICD-10-CM | POA: Diagnosis not present

## 2011-10-21 MED ORDER — CYANOCOBALAMIN 1000 MCG/ML IJ SOLN
1000.0000 ug | Freq: Once | INTRAMUSCULAR | Status: AC
  Administered 2011-10-21: 1000 ug via INTRAMUSCULAR

## 2011-10-22 ENCOUNTER — Ambulatory Visit: Payer: Self-pay | Admitting: Vascular Surgery

## 2011-10-22 ENCOUNTER — Ambulatory Visit: Payer: Medicare Other

## 2011-10-22 DIAGNOSIS — Z86718 Personal history of other venous thrombosis and embolism: Secondary | ICD-10-CM | POA: Diagnosis not present

## 2011-10-22 DIAGNOSIS — Z9889 Other specified postprocedural states: Secondary | ICD-10-CM | POA: Diagnosis not present

## 2011-10-22 DIAGNOSIS — Z79899 Other long term (current) drug therapy: Secondary | ICD-10-CM | POA: Diagnosis not present

## 2011-10-22 DIAGNOSIS — R221 Localized swelling, mass and lump, neck: Secondary | ICD-10-CM | POA: Diagnosis not present

## 2011-10-22 DIAGNOSIS — M7989 Other specified soft tissue disorders: Secondary | ICD-10-CM | POA: Diagnosis not present

## 2011-10-22 DIAGNOSIS — I82409 Acute embolism and thrombosis of unspecified deep veins of unspecified lower extremity: Secondary | ICD-10-CM | POA: Diagnosis not present

## 2011-10-22 DIAGNOSIS — E119 Type 2 diabetes mellitus without complications: Secondary | ICD-10-CM | POA: Diagnosis not present

## 2011-10-22 DIAGNOSIS — R220 Localized swelling, mass and lump, head: Secondary | ICD-10-CM | POA: Diagnosis not present

## 2011-10-22 DIAGNOSIS — T82598A Other mechanical complication of other cardiac and vascular devices and implants, initial encounter: Secondary | ICD-10-CM | POA: Diagnosis not present

## 2011-10-22 LAB — BASIC METABOLIC PANEL
Anion Gap: 10 (ref 7–16)
BUN: 14 mg/dL (ref 7–18)
Co2: 26 mmol/L (ref 21–32)
Creatinine: 0.84 mg/dL (ref 0.60–1.30)
Glucose: 418 mg/dL — ABNORMAL HIGH (ref 65–99)
Osmolality: 288 (ref 275–301)
Potassium: 5 mmol/L (ref 3.5–5.1)
Sodium: 135 mmol/L — ABNORMAL LOW (ref 136–145)

## 2011-10-23 ENCOUNTER — Encounter: Payer: Self-pay | Admitting: Internal Medicine

## 2011-10-23 ENCOUNTER — Other Ambulatory Visit: Payer: Self-pay | Admitting: Internal Medicine

## 2011-10-23 DIAGNOSIS — IMO0002 Reserved for concepts with insufficient information to code with codable children: Secondary | ICD-10-CM

## 2011-10-23 DIAGNOSIS — E1165 Type 2 diabetes mellitus with hyperglycemia: Secondary | ICD-10-CM

## 2011-10-23 MED ORDER — INSULIN DETEMIR 100 UNIT/ML ~~LOC~~ SOLN: 50.0000 [IU] | Freq: Two times a day (BID) | SUBCUTANEOUS | Status: DC

## 2011-10-23 MED ORDER — INSULIN DETEMIR 100 UNIT/ML ~~LOC~~ SOLN: SUBCUTANEOUS | Status: DC

## 2011-10-23 NOTE — Telephone Encounter (Signed)
Refill on levemir  2 times a day  put STAT on the fax [phone] [facility].

## 2011-10-23 NOTE — Telephone Encounter (Signed)
Patient advised that Rx was sent to [facility].

## 2011-11-03 ENCOUNTER — Ambulatory Visit: Payer: Medicare Other

## 2011-11-04 ENCOUNTER — Ambulatory Visit (INDEPENDENT_AMBULATORY_CARE_PROVIDER_SITE_OTHER): Payer: Medicare Other | Admitting: Internal Medicine

## 2011-11-04 DIAGNOSIS — E538 Deficiency of other specified B group vitamins: Secondary | ICD-10-CM

## 2011-11-04 MED ORDER — CYANOCOBALAMIN 1000 MCG/ML IJ SOLN
1000.0000 ug | Freq: Once | INTRAMUSCULAR | Status: AC
  Administered 2011-11-04: 1000 ug via INTRAMUSCULAR

## 2011-11-14 ENCOUNTER — Ambulatory Visit: Payer: Medicare Other | Admitting: Internal Medicine

## 2011-11-27 ENCOUNTER — Ambulatory Visit (INDEPENDENT_AMBULATORY_CARE_PROVIDER_SITE_OTHER): Payer: Medicare Other | Admitting: Internal Medicine

## 2011-11-27 ENCOUNTER — Encounter: Payer: Self-pay | Admitting: Internal Medicine

## 2011-11-27 VITALS — BP 142/84 | HR 68 | Temp 98.7°F | Ht 62.0 in | Wt 232.0 lb

## 2011-11-27 DIAGNOSIS — IMO0001 Reserved for inherently not codable concepts without codable children: Secondary | ICD-10-CM

## 2011-11-27 DIAGNOSIS — E1165 Type 2 diabetes mellitus with hyperglycemia: Secondary | ICD-10-CM

## 2011-11-27 DIAGNOSIS — M7989 Other specified soft tissue disorders: Secondary | ICD-10-CM | POA: Diagnosis not present

## 2011-11-27 DIAGNOSIS — IMO0002 Reserved for concepts with insufficient information to code with codable children: Secondary | ICD-10-CM

## 2011-11-27 DIAGNOSIS — R002 Palpitations: Secondary | ICD-10-CM | POA: Diagnosis not present

## 2011-11-27 DIAGNOSIS — E669 Obesity, unspecified: Secondary | ICD-10-CM

## 2011-11-27 LAB — COMPREHENSIVE METABOLIC PANEL
ALT: 19 U/L (ref 0–35)
BUN: 14 mg/dL (ref 6–23)
CO2: 26 mEq/L (ref 19–32)
Calcium: 9.3 mg/dL (ref 8.4–10.5)
Creatinine, Ser: 0.7 mg/dL (ref 0.4–1.2)
GFR: 93.56 mL/min (ref >60.00)
Total Bilirubin: 0.6 mg/dL (ref 0.3–1.2)

## 2011-11-27 LAB — HEMOGLOBIN A1C: Hgb A1c MFr Bld: 11.7 % — ABNORMAL HIGH (ref 4.6–6.5)

## 2011-11-27 MED ORDER — LIRAGLUTIDE 18 MG/3ML ~~LOC~~ SOLN: 0.6000 mg | Freq: Every day | SUBCUTANEOUS | Status: DC

## 2011-11-27 NOTE — Assessment & Plan Note (Signed)
Patient reports blood sugars have been elevated at home, but did not bring a record today. Will check A1c with labs. Given that she is concerned that her brother did not have medullary thyroid carcinoma, will resume therapy with Victoza. Followup in one month.

## 2011-11-27 NOTE — Assessment & Plan Note (Signed)
Symptoms of left arm swelling are persistent. Evaluation including CT of the chest, venogram and vascular evaluation have all been unremarkable. Suspect that intermittent swelling may be due to poor lymphatic drainage. For now, we'll continue to monitor.

## 2011-11-27 NOTE — Progress Notes (Signed)
Subjective:    Patient ID: [name], female    DOB: [birth year], 66 y.o.   MRN: [id number]  HPI 66 YO female with history of diabetes mellitus, obesity presents for followup. She reports that blood sugars have been elevated but she did not bring a record of blood sugars today. She reports that her blood sugars have been elevated secondary to recent use of prednisone. She is also recently had cough and cold symptoms which she reports typically elevate her blood sugars. She reports compliance with her medications. She notes that she confirms that her brother has papillary thyroid carcinoma not medullary thyroid carcinoma and she would like to resume therapy with Victoza.  In regards to her obesity, she reports that she has made some effort at reducing overall caloric intake. She did not start phentermine because of concerns about side effects. She has not started exercising.  Outpatient Encounter Prescriptions as of 11/27/2011  Medication Sig Dispense Refill  . aspirin 81 MG tablet Take 81 mg by mouth daily.        . diphenhydrAMINE (BENADRYL) 25 MG tablet Take 2 tablets (50 mg total) by mouth once. Take 1 hour prior to procedure  2 tablet  0  . insulin detemir (LEVEMIR) 100 UNIT/ML injection Inject 60 Units into the skin 2 (two) times daily. Eight boxes, three refills      . metFORMIN (GLUCOPHAGE) 1000 MG tablet Take 1 tablet (1,000 mg total) by mouth 2 (two) times daily with a meal.  180 tablet  4  . nitroGLYCERIN (NITROSTAT) 0.4 MG SL tablet Place 0.4 mg under the tongue every 5 (five) minutes as needed.        Marland Kitchen DISCONTD: insulin detemir (LEVEMIR FLEXPEN) 100 UNIT/ML injection Eight boxes, three refills  3 mL  3  . Liraglutide (VICTOZA) 18 MG/3ML SOLN Inject 0.1 mLs (0.6 mg total) into the skin daily.  6 mL  3  . phentermine 37.5 MG capsule Take 1 capsule (37.5 mg total) by mouth every morning.  30 capsule  0  . DISCONTD: citalopram (CELEXA) 20 MG tablet Take 20 mg by mouth daily.        Marland Kitchen  DISCONTD: glimepiride (AMARYL) 4 MG tablet Take 1 tablet (4 mg total) by mouth 2 (two) times daily.  180 tablet  4  . DISCONTD: lisinopril (PRINIVIL,ZESTRIL) 5 MG tablet Take 5 mg by mouth daily.        Marland Kitchen DISCONTD: pantoprazole (PROTONIX) 40 MG tablet Take 1 tablet (40 mg total) by mouth daily.  90 tablet  4  . DISCONTD: ranitidine (ZANTAC) 150 MG tablet Take 1 tablet (150 mg total) by mouth once. Take 1 hour prior to procedure  1 tablet  0    Review of Systems  Constitutional: Negative for fever, chills, appetite change, fatigue and unexpected weight change.  HENT: Negative for ear pain, congestion, sore throat, trouble swallowing, neck pain, voice change and sinus pressure.   Eyes: Negative for visual disturbance.  Respiratory: Negative for cough, shortness of breath, wheezing and stridor.   Cardiovascular: Negative for chest pain, palpitations and leg swelling.  Gastrointestinal: Negative for nausea, vomiting, abdominal pain, diarrhea, constipation, blood in stool, abdominal distention and anal bleeding.  Genitourinary: Negative for dysuria and flank pain.  Musculoskeletal: Negative for myalgias, arthralgias and gait problem.  Skin: Negative for color change and rash.  Neurological: Negative for dizziness and headaches.  Hematological: Negative for adenopathy. Does not bruise/bleed easily.  Psychiatric/Behavioral: Negative for suicidal ideas, disturbed wake/sleep cycle  and dysphoric mood. The patient is not nervous/anxious.        Objective:   Physical Exam  Constitutional: She is oriented to person, place, and time. She appears well-developed and well-nourished. No distress.  HENT:  Head: Normocephalic and atraumatic.  Right Ear: External ear normal.  Left Ear: External ear normal.  Nose: Nose normal.  Mouth/Throat: Oropharynx is clear and moist. No oropharyngeal exudate.  Eyes: Conjunctivae are normal. Pupils are equal, round, and reactive to light. Right eye exhibits no  discharge. Left eye exhibits no discharge. No scleral icterus.  Neck: Normal range of motion. Neck supple. No tracheal deviation present. No thyromegaly present.  Cardiovascular: Normal rate, regular rhythm, normal heart sounds and intact distal pulses.  Frequent extrasystoles are present. Exam reveals no gallop and no friction rub.   No murmur heard. Pulmonary/Chest: Effort normal and breath sounds normal. No respiratory distress. She has no wheezes. She has no rales. She exhibits no tenderness.  Musculoskeletal: Normal range of motion. She exhibits no edema and no tenderness.  Lymphadenopathy:    She has no cervical adenopathy.  Neurological: She is alert and oriented to person, place, and time. No cranial nerve deficit. She exhibits normal muscle tone. Coordination normal.  Skin: Skin is warm and dry. No rash noted. She is not diaphoretic. No erythema. No pallor.  Psychiatric: She has a normal mood and affect. Her behavior is normal. Judgment and thought content normal.          Assessment & Plan:

## 2011-11-27 NOTE — Assessment & Plan Note (Signed)
Frequent palpitations noted on exam today. EKG shows premature atrial contractions.

## 2011-11-27 NOTE — Assessment & Plan Note (Signed)
Congratulated patient on 3 pound weight loss. Encouraged her to continue efforts at healthy diet and regular physical activity. Followup in one month.

## 2012-01-01 ENCOUNTER — Ambulatory Visit: Payer: Medicare Other | Admitting: Internal Medicine

## 2012-01-08 ENCOUNTER — Encounter: Payer: Self-pay | Admitting: Internal Medicine

## 2012-01-08 ENCOUNTER — Ambulatory Visit (INDEPENDENT_AMBULATORY_CARE_PROVIDER_SITE_OTHER): Payer: Medicare Other | Admitting: Internal Medicine

## 2012-01-08 VITALS — BP 104/68 | HR 69 | Temp 98.7°F | Ht 62.0 in | Wt 233.8 lb

## 2012-01-08 DIAGNOSIS — E669 Obesity, unspecified: Secondary | ICD-10-CM

## 2012-01-08 DIAGNOSIS — IMO0001 Reserved for inherently not codable concepts without codable children: Secondary | ICD-10-CM

## 2012-01-08 DIAGNOSIS — E1165 Type 2 diabetes mellitus with hyperglycemia: Secondary | ICD-10-CM

## 2012-01-08 DIAGNOSIS — IMO0002 Reserved for concepts with insufficient information to code with codable children: Secondary | ICD-10-CM

## 2012-01-08 MED ORDER — LIRAGLUTIDE 18 MG/3ML ~~LOC~~ SOLN: 1.2000 mg | Freq: Every day | SUBCUTANEOUS | Status: DC

## 2012-01-08 NOTE — Progress Notes (Signed)
Subjective:    Patient ID: [name], female    DOB: 1946/03/22, 66 y.o.   MRN: [id number]  HPI 66 year old female with history of diabetes, obesity presents for followup. At last visit, patient's blood sugars were markedly elevated with A1c of 11.7%. She reports over the last month she has significantly improved her diet. She has increased her Levemir to 70 units twice daily but has had some low sugars and had to reduce dose back to 60 units twice daily. She has also started Victoza. She denies any side effects from this medication such as nausea. She reports she is generally feeling well. She has intermittently walk on her treadmill but does not have a regular exercise plan.  Outpatient Encounter Prescriptions as of 01/08/2012  Medication Sig Dispense Refill  . aspirin 81 MG tablet Take 81 mg by mouth daily.        . insulin detemir (LEVEMIR) 100 UNIT/ML injection Inject 60 Units into the skin 2 (two) times daily. Eight boxes, three refills      . Liraglutide (VICTOZA) 18 MG/3ML SOLN Inject 0.2 mLs (1.2 mg total) into the skin daily.  9 mL  3  . metFORMIN (GLUCOPHAGE) 1000 MG tablet Take 1 tablet (1,000 mg total) by mouth 2 (two) times daily with a meal.  180 tablet  4  . nitroGLYCERIN (NITROSTAT) 0.4 MG SL tablet Place 0.4 mg under the tongue every 5 (five) minutes as needed.         BP 104/68  Pulse 69  Temp 98.7 F (37.1 C) (Oral)  Ht 5\' 2"  (1.575 m)  Wt 233 lb 12 oz (106.028 kg)  BMI 42.75 kg/m2  SpO2 95%  Review of Systems  Constitutional: Negative for fever, chills, appetite change, fatigue and unexpected weight change.  HENT: Negative for ear pain, congestion, sore throat, trouble swallowing, neck pain, voice change and sinus pressure.   Eyes: Negative for visual disturbance.  Respiratory: Negative for cough, shortness of breath, wheezing and stridor.   Cardiovascular: Negative for chest pain, palpitations and leg swelling.  Gastrointestinal: Negative for nausea, vomiting,  abdominal pain, diarrhea, constipation, blood in stool, abdominal distention and anal bleeding.  Genitourinary: Negative for dysuria and flank pain.  Musculoskeletal: Negative for myalgias, arthralgias and gait problem.  Skin: Negative for color change and rash.  Neurological: Negative for dizziness and headaches.  Hematological: Negative for adenopathy. Does not bruise/bleed easily.  Psychiatric/Behavioral: Negative for suicidal ideas, disturbed wake/sleep cycle and dysphoric mood. The patient is not nervous/anxious.        Objective:   Physical Exam  Constitutional: She is oriented to person, place, and time. She appears well-developed and well-nourished. No distress.  HENT:  Head: Normocephalic and atraumatic.  Right Ear: External ear normal.  Left Ear: External ear normal.  Nose: Nose normal.  Mouth/Throat: Oropharynx is clear and moist. No oropharyngeal exudate.  Eyes: Conjunctivae are normal. Pupils are equal, round, and reactive to light. Right eye exhibits no discharge. Left eye exhibits no discharge. No scleral icterus.  Neck: Normal range of motion. Neck supple. No tracheal deviation present. No thyromegaly present.  Cardiovascular: Normal rate, regular rhythm, normal heart sounds and intact distal pulses.  Exam reveals no gallop and no friction rub.   No murmur heard. Pulmonary/Chest: Effort normal and breath sounds normal. No respiratory distress. She has no wheezes. She has no rales. She exhibits no tenderness.  Musculoskeletal: Normal range of motion. She exhibits no edema and no tenderness.  Lymphadenopathy:  She has no cervical adenopathy.  Neurological: She is alert and oriented to person, place, and time. No cranial nerve deficit. She exhibits normal muscle tone. Coordination normal.  Skin: Skin is warm and dry. No rash noted. She is not diaphoretic. No erythema. No pallor.  Psychiatric: She has a normal mood and affect. Her behavior is normal. Judgment and thought  content normal.          Assessment & Plan:

## 2012-01-08 NOTE — Assessment & Plan Note (Signed)
Recent A1c was elevated at 11.7%. Patient's dose of Levemir was increased to 70 units twice daily and she was started on Victoza. She reports that sugars have improved significantly. Will plan to repeat A1c with labs next month. Encourage continued efforts at healthy diet and regular physical activity with a goal of 175 minutes per week.

## 2012-01-08 NOTE — Assessment & Plan Note (Signed)
No significant weight change over the last month. Encouraged patient to continue efforts at healthy diet and increase her physical activity with a goal of 30 minutes most days of the week or weekly goal of 175 minutes. Followup in month.

## 2012-02-16 ENCOUNTER — Other Ambulatory Visit (INDEPENDENT_AMBULATORY_CARE_PROVIDER_SITE_OTHER): Payer: Medicare Other

## 2012-02-16 DIAGNOSIS — E1165 Type 2 diabetes mellitus with hyperglycemia: Secondary | ICD-10-CM

## 2012-02-16 DIAGNOSIS — IMO0001 Reserved for inherently not codable concepts without codable children: Secondary | ICD-10-CM | POA: Diagnosis not present

## 2012-02-16 DIAGNOSIS — IMO0002 Reserved for concepts with insufficient information to code with codable children: Secondary | ICD-10-CM

## 2012-02-16 LAB — COMPREHENSIVE METABOLIC PANEL
ALT: 15 U/L (ref 0–35)
Albumin: 3.7 g/dL (ref 3.5–5.2)
CO2: 28 mEq/L (ref 19–32)
Calcium: 9.3 mg/dL (ref 8.4–10.5)
Chloride: 103 mEq/L (ref 96–112)
GFR: 84.69 mL/min (ref >60.00)
Glucose, Bld: 132 mg/dL — ABNORMAL HIGH (ref 70–99)
Potassium: 4 mEq/L (ref 3.5–5.1)
Sodium: 138 mEq/L (ref 135–145)
Total Protein: 7.5 g/dL (ref 6.0–8.3)

## 2012-02-19 ENCOUNTER — Ambulatory Visit (INDEPENDENT_AMBULATORY_CARE_PROVIDER_SITE_OTHER): Payer: Medicare Other | Admitting: Internal Medicine

## 2012-02-19 ENCOUNTER — Encounter: Payer: Self-pay | Admitting: Internal Medicine

## 2012-02-19 VITALS — BP 140/82 | HR 72 | Temp 98.4°F | Ht 62.0 in | Wt 235.5 lb

## 2012-02-19 DIAGNOSIS — IMO0001 Reserved for inherently not codable concepts without codable children: Secondary | ICD-10-CM

## 2012-02-19 DIAGNOSIS — IMO0002 Reserved for concepts with insufficient information to code with codable children: Secondary | ICD-10-CM

## 2012-02-19 DIAGNOSIS — E669 Obesity, unspecified: Secondary | ICD-10-CM | POA: Diagnosis not present

## 2012-02-19 DIAGNOSIS — Z23 Encounter for immunization: Secondary | ICD-10-CM

## 2012-02-19 DIAGNOSIS — E1165 Type 2 diabetes mellitus with hyperglycemia: Secondary | ICD-10-CM

## 2012-02-19 MED ORDER — LIRAGLUTIDE 18 MG/3ML ~~LOC~~ SOLN: 1.8000 mg | Freq: Every day | SUBCUTANEOUS | Status: DC

## 2012-02-19 NOTE — Assessment & Plan Note (Signed)
Encourage continued efforts at healthy diet and regular physical activity. Followup in 3 months.

## 2012-02-19 NOTE — Progress Notes (Signed)
Subjective:    Patient ID: [name], female    DOB: 07-20-1945, 66 y.o.   MRN: [id number]  HPI 66 year old female with history of diabetes, obesity presents for followup. She reports she is doing well. She is making efforts at following a healthy diet and getting regular physical activity. She has been walking on a regular basis. Blood sugars are much improved with A1c of 7.5% down from 11.7% on last check. She reports full compliance with Levemir insulin and with Victoza. She denies any new concerns today.  Outpatient Encounter Prescriptions as of 02/19/2012  Medication Sig Dispense Refill  . aspirin 81 MG tablet Take 81 mg by mouth daily.        . insulin detemir (LEVEMIR) 100 UNIT/ML injection Inject 60 Units into the skin 2 (two) times daily. Eight boxes, three refills      . Liraglutide (VICTOZA) 18 MG/3ML SOLN Inject 0.3 mLs (1.8 mg total) into the skin daily.  9 mL  3  . metFORMIN (GLUCOPHAGE) 1000 MG tablet Take 1 tablet (1,000 mg total) by mouth 2 (two) times daily with a meal.  180 tablet  4  . nitroGLYCERIN (NITROSTAT) 0.4 MG SL tablet Place 0.4 mg under the tongue every 5 (five) minutes as needed.        Marland Kitchen DISCONTD: Liraglutide (VICTOZA) 18 MG/3ML SOLN Inject 0.2 mLs (1.2 mg total) into the skin daily.  9 mL  3   BP 140/82  Pulse 72  Temp 98.4 F (36.9 C) (Oral)  Ht 5\' 2"  (1.575 m)  Wt 235 lb 8 oz (106.822 kg)  BMI 43.07 kg/m2  SpO2 97%  Review of Systems  Constitutional: Negative for fever, chills, appetite change, fatigue and unexpected weight change.  HENT: Negative for ear pain, congestion, sore throat, trouble swallowing, neck pain, voice change and sinus pressure.   Eyes: Negative for visual disturbance.  Respiratory: Negative for cough, shortness of breath, wheezing and stridor.   Cardiovascular: Negative for chest pain, palpitations and leg swelling.  Gastrointestinal: Negative for nausea, vomiting, abdominal pain, diarrhea, constipation, blood in stool,  abdominal distention and anal bleeding.  Genitourinary: Negative for dysuria and flank pain.  Musculoskeletal: Negative for myalgias, arthralgias and gait problem.  Skin: Negative for color change and rash.  Neurological: Negative for dizziness and headaches.  Hematological: Negative for adenopathy. Does not bruise/bleed easily.  Psychiatric/Behavioral: Negative for suicidal ideas, disturbed wake/sleep cycle and dysphoric mood. The patient is not nervous/anxious.        Objective:   Physical Exam  Constitutional: She is oriented to person, place, and time. She appears well-developed and well-nourished. No distress.  HENT:  Head: Normocephalic and atraumatic.  Right Ear: External ear normal.  Left Ear: External ear normal.  Nose: Nose normal.  Mouth/Throat: Oropharynx is clear and moist. No oropharyngeal exudate.  Eyes: Conjunctivae normal are normal. Pupils are equal, round, and reactive to light. Right eye exhibits no discharge. Left eye exhibits no discharge. No scleral icterus.  Neck: Normal range of motion. Neck supple. No tracheal deviation present. No thyromegaly present.  Cardiovascular: Normal rate, regular rhythm, normal heart sounds and intact distal pulses.  Exam reveals no gallop and no friction rub.   No murmur heard. Pulmonary/Chest: Effort normal and breath sounds normal. No respiratory distress. She has no wheezes. She has no rales. She exhibits no tenderness.  Musculoskeletal: Normal range of motion. She exhibits no edema and no tenderness.  Lymphadenopathy:    She has no cervical adenopathy.  Neurological:  She is alert and oriented to person, place, and time. No cranial nerve deficit. She exhibits normal muscle tone. Coordination normal.  Skin: Skin is warm and dry. No rash noted. She is not diaphoretic. No erythema. No pallor.  Psychiatric: She has a normal mood and affect. Her behavior is normal. Judgment and thought content normal.          Assessment & Plan:

## 2012-02-19 NOTE — Assessment & Plan Note (Signed)
Congratulated patient on marked improvement in blood sugars with A1c of 7.5% down from 11.7%. We'll continue Victoza and will increase dose to 1.8 mg daily. She may ultimately be able to decrease use of insulin to 50 units twice daily. She will continue to monitor blood sugars and will make this change if having low sugars on a regular basis. She will followup in 3 months.

## 2012-03-18 ENCOUNTER — Telehealth: Payer: Self-pay | Admitting: Internal Medicine

## 2012-03-18 NOTE — Telephone Encounter (Signed)
Pt was wondering if we had any Victosa samples.

## 2012-03-18 NOTE — Telephone Encounter (Signed)
We do have Victoza samples, patient advised via telephone.  She can pick them up at her convenience.

## 2012-03-19 ENCOUNTER — Telehealth: Payer: Self-pay | Admitting: Internal Medicine

## 2012-03-19 NOTE — Telephone Encounter (Signed)
Patient was looking at her discharge paper work from this office and it has on it that she has a bipolar disorder and she does not. The patient wants that removed from her chart.

## 2012-03-21 NOTE — Telephone Encounter (Signed)
That has been on her record for some time. I have never treated her for this. Question if this was an error from abstraction. We can remove from chart.

## 2012-03-22 NOTE — Telephone Encounter (Signed)
Bipoloar Disorder removed from patients chart, patient advised.

## 2012-04-09 ENCOUNTER — Other Ambulatory Visit: Payer: Self-pay | Admitting: Internal Medicine

## 2012-04-09 MED ORDER — NITROGLYCERIN 0.4 MG SL SUBL: 0.4000 mg | SUBLINGUAL_TABLET | SUBLINGUAL | Status: AC | PRN

## 2012-05-10 ENCOUNTER — Ambulatory Visit (INDEPENDENT_AMBULATORY_CARE_PROVIDER_SITE_OTHER): Payer: Medicare Other | Admitting: Internal Medicine

## 2012-05-10 ENCOUNTER — Encounter: Payer: Self-pay | Admitting: Internal Medicine

## 2012-05-10 VITALS — BP 130/78 | HR 65 | Temp 98.4°F | Resp 16 | Wt 232.8 lb

## 2012-05-10 DIAGNOSIS — E669 Obesity, unspecified: Secondary | ICD-10-CM | POA: Diagnosis not present

## 2012-05-10 DIAGNOSIS — IMO0001 Reserved for inherently not codable concepts without codable children: Secondary | ICD-10-CM

## 2012-05-10 DIAGNOSIS — J069 Acute upper respiratory infection, unspecified: Secondary | ICD-10-CM | POA: Diagnosis not present

## 2012-05-10 DIAGNOSIS — IMO0002 Reserved for concepts with insufficient information to code with codable children: Secondary | ICD-10-CM

## 2012-05-10 DIAGNOSIS — E1165 Type 2 diabetes mellitus with hyperglycemia: Secondary | ICD-10-CM

## 2012-05-10 LAB — COMPREHENSIVE METABOLIC PANEL
ALT: 17 U/L (ref 0–35)
Alkaline Phosphatase: 64 U/L (ref 39–117)
Creatinine, Ser: 0.7 mg/dL (ref 0.4–1.2)
Sodium: 139 mEq/L (ref 135–145)
Total Bilirubin: 0.5 mg/dL (ref 0.3–1.2)
Total Protein: 8.1 g/dL (ref 6.0–8.3)

## 2012-05-10 MED ORDER — LIRAGLUTIDE 18 MG/3ML ~~LOC~~ SOLN: 1.8000 mg | Freq: Every day | SUBCUTANEOUS | Status: DC

## 2012-05-10 NOTE — Assessment & Plan Note (Signed)
Pt reports much better control of BG over last few months. Will check A1c with labs. Encouraged healthy diet, low in processed carbohydrates and regular physical activity with goal of most days of the week. Continue current meds. Follow up 3 months.

## 2012-05-10 NOTE — Assessment & Plan Note (Signed)
Symptoms most consistent with viral URI. Recommended use of Claritin or Zyrtec during the day, sudafed sparingly as needed for congestion. Pt will call if symptoms worsening, or if fever develops.

## 2012-05-10 NOTE — Assessment & Plan Note (Signed)
Wt Readings from Last 3 Encounters:  05/10/12 232 lb 12 oz (105.575 kg)  02/19/12 235 lb 8 oz (106.822 kg)  01/08/12 233 lb 12 oz (106.028 kg)   Weight has been relatively unchanged. Encouraged better compliance with diet and setting goal of exercise daily.  Folowup 3 months and prn.

## 2012-05-10 NOTE — Progress Notes (Signed)
Subjective:    Patient ID: [name], female    DOB: [birth year], 66 y.o.   MRN: [id number]  HPI 66YO female with DM, obesity, presents for follow up. Pt reports BG have been very well controlled on current medications. BG this morning 84, which is typical for her. She has not been following a particular diet or exercise program.  Planning to join a local gym later this month. Notes she has had some nasal congestion, drainage over last couple of days. No fever chills.  Had some right ear pain, which is now gone. Occasional dry cough. Not taking any medications for symptoms.  Outpatient Encounter Prescriptions as of 05/10/2012  Medication Sig Dispense Refill  . aspirin 81 MG tablet Take 81 mg by mouth daily.        . insulin detemir (LEVEMIR) 100 UNIT/ML injection Inject 60 Units into the skin 2 (two) times daily. Eight boxes, three refills      . Liraglutide (VICTOZA) 18 MG/3ML SOLN Inject 0.3 mLs (1.8 mg total) into the skin daily.  12 mL  3  . metFORMIN (GLUCOPHAGE) 1000 MG tablet Take 1 tablet (1,000 mg total) by mouth 2 (two) times daily with a meal.  180 tablet  4  . nitroGLYCERIN (NITROSTAT) 0.4 MG SL tablet Place 1 tablet (0.4 mg total) under the tongue every 5 (five) minutes as needed for chest pain.  30 tablet  6  . [DISCONTINUED] Liraglutide (VICTOZA) 18 MG/3ML SOLN Inject 0.3 mLs (1.8 mg total) into the skin daily.  9 mL  3   BP 130/78  Pulse 65  Temp 98.4 F (36.9 C) (Oral)  Resp 16  Wt 232 lb 12 oz (105.575 kg)  SpO2 98%  Review of Systems  Constitutional: Negative for fever, chills, appetite change, fatigue and unexpected weight change.  HENT: Positive for ear pain, congestion, rhinorrhea and postnasal drip. Negative for sore throat, trouble swallowing, neck pain, voice change and sinus pressure.   Eyes: Negative for visual disturbance.  Respiratory: Positive for cough. Negative for shortness of breath, wheezing and stridor.   Cardiovascular: Negative for chest pain,  palpitations and leg swelling.  Gastrointestinal: Negative for nausea, vomiting, abdominal pain, diarrhea, constipation, blood in stool, abdominal distention and anal bleeding.  Genitourinary: Negative for dysuria and flank pain.  Musculoskeletal: Negative for myalgias, arthralgias and gait problem.  Skin: Negative for color change and rash.  Neurological: Negative for dizziness and headaches.  Hematological: Negative for adenopathy. Does not bruise/bleed easily.  Psychiatric/Behavioral: Negative for suicidal ideas, sleep disturbance and dysphoric mood. The patient is not nervous/anxious.        Objective:   Physical Exam  Constitutional: She is oriented to person, place, and time. She appears well-developed and well-nourished. No distress.  HENT:  Head: Normocephalic and atraumatic.  Right Ear: Tympanic membrane, external ear and ear canal normal.  Left Ear: Tympanic membrane, external ear and ear canal normal.  Nose: Nose normal.  Mouth/Throat: Oropharynx is clear and moist. No oropharyngeal exudate.  Eyes: Conjunctivae normal are normal. Pupils are equal, round, and reactive to light. Right eye exhibits no discharge. Left eye exhibits no discharge. No scleral icterus.  Neck: Normal range of motion. Neck supple. No tracheal deviation present. No thyromegaly present.  Cardiovascular: Normal rate, regular rhythm, normal heart sounds and intact distal pulses.  Exam reveals no gallop and no friction rub.   No murmur heard. Pulmonary/Chest: Effort normal and breath sounds normal. No accessory muscle usage. Not tachypneic. No respiratory distress.  She has no decreased breath sounds. She has no wheezes. She has no rhonchi. She has no rales. She exhibits no tenderness.  Musculoskeletal: Normal range of motion. She exhibits no edema and no tenderness.  Lymphadenopathy:    She has no cervical adenopathy.  Neurological: She is alert and oriented to person, place, and time. No cranial nerve  deficit. She exhibits normal muscle tone. Coordination normal.  Skin: Skin is warm and dry. No rash noted. She is not diaphoretic. No erythema. No pallor.  Psychiatric: She has a normal mood and affect. Her behavior is normal. Judgment and thought content normal.          Assessment & Plan:

## 2012-05-11 ENCOUNTER — Telehealth: Payer: Self-pay | Admitting: Internal Medicine

## 2012-05-11 DIAGNOSIS — E1165 Type 2 diabetes mellitus with hyperglycemia: Secondary | ICD-10-CM

## 2012-05-11 DIAGNOSIS — IMO0002 Reserved for concepts with insufficient information to code with codable children: Secondary | ICD-10-CM

## 2012-05-11 NOTE — Telephone Encounter (Signed)
Caller: [name]/Patient; Phone: [phone]; Reason for Call: Victoza 18 mg/6ml RX faxed to [facility] on 05/10/12 but only a 30 day supply.  This needs to be a 90 day supply.  Please call/fax to [facility] the remaining.  RX # [id number] [facility] Phone is [phone]

## 2012-05-12 NOTE — Telephone Encounter (Signed)
I specifically asked for 4 vials when I sent in the Rx.  Can you confirm with her that she is using 1 vial per week? This seems excessive

## 2012-05-17 ENCOUNTER — Telehealth: Payer: Self-pay | Admitting: Internal Medicine

## 2012-05-17 NOTE — Telephone Encounter (Signed)
[facility] sent [name] a thirty day supply instead of a 90 day supply of her Victoza . She stated if you call [facility] they will bunk it or bump it up and will not charge the patient. She also want you to check on [name]'s victoza to see if it was sent in for a 90 day instead of 30 day supply.

## 2012-05-19 ENCOUNTER — Ambulatory Visit: Payer: Medicare Other | Admitting: Internal Medicine

## 2012-05-19 NOTE — Telephone Encounter (Signed)
I have called patient several times on home number and cell number, unable to leave a message.  Recording says your call can't be completed as dialed.  I will try again later or wait for the patient to call back.

## 2012-05-21 MED ORDER — LIRAGLUTIDE 18 MG/3ML ~~LOC~~ SOLN: 1.8000 mg | Freq: Every day | SUBCUTANEOUS | Status: DC

## 2012-05-21 NOTE — Telephone Encounter (Signed)
I called and spoke with the rep from [facility] and was advised that we only sent in a one month supply of Victozia.  I advised the rep that they told us that 12ml was a 90 day supply.  They will not sent out the remainder of the order because they believe it was not there fault.  A new Rx was sent in electronically.

## 2012-06-23 ENCOUNTER — Telehealth: Payer: Self-pay | Admitting: Internal Medicine

## 2012-06-23 NOTE — Telephone Encounter (Signed)
Patient Information:  Caller Name: Meade Maw  Phone: [phone]  Patient: [name], [name]  Gender: Female  DOB: 12-Jun-1945  Age: 67 Years  PCP: Ronna Polio (Adults only)  Office Follow Up:  Does the office need to follow up with this patient?: Yes  Instructions For The Office: Patient very concerned since does not drive in snow that is predicted by lunch today. Would like to go to [facility] now or have something called into [facility]. Please review and call patient at  [phone].   Symptoms  Reason For Call & Symptoms: Woke about 1 am today 2/12 with very sore throat, cough and not able to talk.  Has had some wheezing but not currently.  Has headache.  Exposed to Strep recently.  Reviewed Health History In EMR: Yes  Reviewed Medications In EMR: Yes  Reviewed Allergies In EMR: Yes  Reviewed Surgeries / Procedures: Yes  Date of Onset of Symptoms: 06/23/2012  Treatments Tried: hot tea, gargled saline  Treatments Tried Worked: Yes  Guideline(s) Used:  Sore Throat  Disposition Per Guideline:   See Today in Office  Reason For Disposition Reached:   Severe sore throat pain  Advice Given:  Soft Diet:   Cold drinks and milk shakes are especially good (Reason: swollen tonsils can make some foods hard to swallow).  Liquids:  Adequate liquid intake is important to prevent dehydration. Drink 6-8 glasses of water per day.  Contagiousness:   You can return to work or school after the fever is gone and you feel well enough to participate in normal activities. If your doctor determines that you have Strep throat, then you will need to take an antibiotic for 24 hours before you can return.  Expected Course:  Sore throats with viral illnesses usually last 3 or 4 days.  Call Back If:  You become worse.

## 2012-06-23 NOTE — Telephone Encounter (Signed)
Needs to be seen as this may be viral infection, not strep. Would recommend urgent care, as office closed this afternoon for snow.

## 2012-06-23 NOTE — Telephone Encounter (Signed)
Patient informed and verbally agreed understanding.  

## 2012-08-09 ENCOUNTER — Other Ambulatory Visit: Payer: Medicare Other

## 2012-08-10 ENCOUNTER — Ambulatory Visit: Payer: Medicare Other | Admitting: Internal Medicine

## 2012-09-02 ENCOUNTER — Other Ambulatory Visit (INDEPENDENT_AMBULATORY_CARE_PROVIDER_SITE_OTHER): Payer: Medicare Other

## 2012-09-02 ENCOUNTER — Other Ambulatory Visit: Payer: Self-pay | Admitting: Internal Medicine

## 2012-09-02 DIAGNOSIS — IMO0001 Reserved for inherently not codable concepts without codable children: Secondary | ICD-10-CM

## 2012-09-02 DIAGNOSIS — IMO0002 Reserved for concepts with insufficient information to code with codable children: Secondary | ICD-10-CM

## 2012-09-02 DIAGNOSIS — E1165 Type 2 diabetes mellitus with hyperglycemia: Secondary | ICD-10-CM

## 2012-09-02 LAB — COMPREHENSIVE METABOLIC PANEL WITH GFR
ALT: 17 U/L (ref 0–35)
AST: 24 U/L (ref 0–37)
Albumin: 3.9 g/dL (ref 3.5–5.2)
Alkaline Phosphatase: 74 U/L (ref 39–117)
BUN: 12 mg/dL (ref 6–23)
CO2: 29 meq/L (ref 19–32)
Calcium: 9.2 mg/dL (ref 8.4–10.5)
Chloride: 99 meq/L (ref 96–112)
Creatinine, Ser: 0.7 mg/dL (ref 0.4–1.2)
GFR: 91.76 mL/min
Glucose, Bld: 143 mg/dL — ABNORMAL HIGH (ref 70–99)
Potassium: 4.2 meq/L (ref 3.5–5.1)
Sodium: 137 meq/L (ref 135–145)
Total Bilirubin: 0.4 mg/dL (ref 0.3–1.2)
Total Protein: 7.5 g/dL (ref 6.0–8.3)

## 2012-09-02 LAB — LDL CHOLESTEROL, DIRECT: Direct LDL: 152.2 mg/dL

## 2012-09-02 LAB — LIPID PANEL
Cholesterol: 210 mg/dL — ABNORMAL HIGH (ref 0–200)
HDL: 47.7 mg/dL
Total CHOL/HDL Ratio: 4
Triglycerides: 109 mg/dL (ref 0.0–149.0)
VLDL: 21.8 mg/dL (ref 0.0–40.0)

## 2012-09-02 LAB — HEMOGLOBIN A1C: Hgb A1c MFr Bld: 7.8 % — ABNORMAL HIGH (ref 4.6–6.5)

## 2012-09-06 ENCOUNTER — Ambulatory Visit (INDEPENDENT_AMBULATORY_CARE_PROVIDER_SITE_OTHER): Payer: Medicare Other | Admitting: Internal Medicine

## 2012-09-06 ENCOUNTER — Ambulatory Visit (INDEPENDENT_AMBULATORY_CARE_PROVIDER_SITE_OTHER)
Admission: RE | Admit: 2012-09-06 | Discharge: 2012-09-06 | Disposition: A | Discharge status: Home / Self Care | Payer: Medicare Other | Source: Ambulatory Visit | Attending: Internal Medicine | Admitting: Internal Medicine

## 2012-09-06 ENCOUNTER — Encounter: Payer: Self-pay | Admitting: Internal Medicine

## 2012-09-06 ENCOUNTER — Telehealth: Payer: Self-pay | Admitting: Internal Medicine

## 2012-09-06 VITALS — BP 152/88 | HR 70 | Temp 98.3°F | Wt 242.0 lb

## 2012-09-06 DIAGNOSIS — E669 Obesity, unspecified: Secondary | ICD-10-CM

## 2012-09-06 DIAGNOSIS — Z1239 Encounter for other screening for malignant neoplasm of breast: Secondary | ICD-10-CM

## 2012-09-06 DIAGNOSIS — M25559 Pain in unspecified hip: Secondary | ICD-10-CM

## 2012-09-06 DIAGNOSIS — M25551 Pain in right hip: Secondary | ICD-10-CM

## 2012-09-06 DIAGNOSIS — IMO0001 Reserved for inherently not codable concepts without codable children: Secondary | ICD-10-CM | POA: Diagnosis not present

## 2012-09-06 DIAGNOSIS — E1165 Type 2 diabetes mellitus with hyperglycemia: Secondary | ICD-10-CM

## 2012-09-06 DIAGNOSIS — M169 Osteoarthritis of hip, unspecified: Secondary | ICD-10-CM | POA: Diagnosis not present

## 2012-09-06 DIAGNOSIS — IMO0002 Reserved for concepts with insufficient information to code with codable children: Secondary | ICD-10-CM

## 2012-09-06 DIAGNOSIS — I10 Essential (primary) hypertension: Secondary | ICD-10-CM | POA: Diagnosis not present

## 2012-09-06 DIAGNOSIS — M1610 Unilateral primary osteoarthritis, unspecified hip: Secondary | ICD-10-CM | POA: Diagnosis not present

## 2012-09-06 MED ORDER — LOSARTAN POTASSIUM 25 MG PO TABS: 25.0000 mg | ORAL_TABLET | Freq: Every day | ORAL | Status: DC

## 2012-09-06 NOTE — Assessment & Plan Note (Signed)
BP Readings from Last 3 Encounters:  09/06/12 152/88  05/10/12 130/78  02/19/12 140/82   Blood pressure is elevated today. Likely exacerbated by right hip pain. Patient has been intolerant to ACE inhibitors in the past with hypotension. Will try low-dose ARB. Plan to repeat renal function in one week. Followup in 4 weeks.

## 2012-09-06 NOTE — Telephone Encounter (Signed)
Yes, I sent a message with the xray. Xray showed chronic changes but no acute process. I recommended Ibuprofen 600mg  tid prn. If no improvement over next few days, we can set up ortho evaluation.

## 2012-09-06 NOTE — Telephone Encounter (Signed)
Pt called asking for xray results.

## 2012-09-06 NOTE — Progress Notes (Signed)
Subjective:    Patient ID: [name], female    DOB: [birth year], 67 y.o.   MRN: [id number]  HPI 67 year old female with history of obesity, diabetes presents for followup. In regards to diabetes, recent hemoglobin A1c showed slight improvement. She reports compliance with her medication. She denies any blood sugars consistently greater than 200 or less than 80. She is trying to improve her diet and lose weight. She has also been participating in an exercise program at a local gym with her husband.  She is concerned today about several day history of right hip pain. This is described as aching. It does not radiate. She has not taken medication for this. The pain is made worse with movement and ambulation. It is improved by staying still. She denies any trauma to her hip.  Outpatient Encounter Prescriptions as of 09/06/2012  Medication Sig Dispense Refill  . aspirin 81 MG tablet Take 81 mg by mouth daily.        . insulin detemir (LEVEMIR) 100 UNIT/ML injection Inject 60 Units into the skin 2 (two) times daily. Eight boxes, three refills      . Liraglutide (VICTOZA) 18 MG/3ML SOLN Inject 0.3 mLs (1.8 mg total) into the skin daily.  27 mL  3  . metFORMIN (GLUCOPHAGE) 1000 MG tablet Take 1 tablet (1,000 mg total) by mouth 2 (two) times daily with a meal.  180 tablet  4  . nitroGLYCERIN (NITROSTAT) 0.4 MG SL tablet Place 1 tablet (0.4 mg total) under the tongue every 5 (five) minutes as needed for chest pain.  30 tablet  6  . losartan (COZAAR) 25 MG tablet Take 1 tablet (25 mg total) by mouth daily.  30 tablet  3   No facility-administered encounter medications on file as of 09/06/2012.   BP 152/88  Pulse 70  Temp(Src) 98.3 F (36.8 C) (Oral)  Wt 242 lb (109.77 kg)  BMI 44.25 kg/m2  SpO2 97%  Review of Systems  Constitutional: Negative for fever, chills, appetite change, fatigue and unexpected weight change.  HENT: Negative for ear pain, congestion, sore throat, trouble swallowing, neck  pain, voice change and sinus pressure.   Eyes: Negative for visual disturbance.  Respiratory: Negative for cough, shortness of breath, wheezing and stridor.   Cardiovascular: Negative for chest pain, palpitations and leg swelling.  Gastrointestinal: Negative for nausea, vomiting, abdominal pain, diarrhea, constipation, blood in stool, abdominal distention and anal bleeding.  Genitourinary: Negative for dysuria and flank pain.  Musculoskeletal: Positive for arthralgias. Negative for myalgias and gait problem.  Skin: Negative for color change and rash.  Neurological: Negative for dizziness and headaches.  Hematological: Negative for adenopathy. Does not bruise/bleed easily.  Psychiatric/Behavioral: Negative for suicidal ideas, sleep disturbance and dysphoric mood. The patient is not nervous/anxious.        Objective:   Physical Exam  Constitutional: She is oriented to person, place, and time. She appears well-developed and well-nourished. No distress.  HENT:  Head: Normocephalic and atraumatic.  Right Ear: External ear normal.  Left Ear: External ear normal.  Nose: Nose normal.  Mouth/Throat: Oropharynx is clear and moist. No oropharyngeal exudate.  Eyes: Conjunctivae are normal. Pupils are equal, round, and reactive to light. Right eye exhibits no discharge. Left eye exhibits no discharge. No scleral icterus.  Neck: Normal range of motion. Neck supple. No tracheal deviation present. No thyromegaly present.  Cardiovascular: Normal rate, regular rhythm, normal heart sounds and intact distal pulses.  Exam reveals no gallop and no friction  rub.   No murmur heard. Pulmonary/Chest: Effort normal and breath sounds normal. No respiratory distress. She has no wheezes. She has no rales. She exhibits no tenderness.  Musculoskeletal: Normal range of motion. She exhibits no edema and no tenderness.       Right hip: She exhibits tenderness. She exhibits normal range of motion and normal strength.   Lymphadenopathy:    She has no cervical adenopathy.  Neurological: She is alert and oriented to person, place, and time. No cranial nerve deficit. She exhibits normal muscle tone. Coordination normal.  Skin: Skin is warm and dry. No rash noted. She is not diaphoretic. No erythema. No pallor.  Psychiatric: She has a normal mood and affect. Her behavior is normal. Judgment and thought content normal.          Assessment & Plan:

## 2012-09-06 NOTE — Assessment & Plan Note (Signed)
Wt Readings from Last 3 Encounters:  09/06/12 242 lb (109.77 kg)  05/10/12 232 lb 12 oz (105.575 kg)  02/19/12 235 lb 8 oz (106.822 kg)    Weight is actually increased today. Encouraged better compliance with diet and regular physical activity. Continue current medications.

## 2012-09-06 NOTE — Telephone Encounter (Signed)
Patient informed and verbally agreed understanding.  

## 2012-09-06 NOTE — Assessment & Plan Note (Signed)
Lab Results  Component Value Date   HGBA1C 7.8* 09/02/2012   Blood sugar slightly improved compared to previous. Will continue current medications. Repeat A1c July 2014.

## 2012-09-06 NOTE — Assessment & Plan Note (Signed)
Likely related to osteoarthritis. Exam limited by obesity. Will get plain x-ray for further evaluation. Will use prn Ibuprofen for pain in interim.

## 2012-09-06 NOTE — Telephone Encounter (Signed)
Have you received the results for her yet?

## 2012-09-07 ENCOUNTER — Telehealth: Payer: Self-pay | Admitting: *Deleted

## 2012-09-07 DIAGNOSIS — M25551 Pain in right hip: Secondary | ICD-10-CM

## 2012-09-07 NOTE — Telephone Encounter (Signed)
Her symptoms are most suggestive of arthritis in her hip. She does not have abdominal pain, bowel symptoms to suggest other pathology (or did not at the time of her visit). I think it might be helpful to set up ortho evaluation. They may want to get CT or MRI for further evaluation of her hip.

## 2012-09-07 NOTE — Telephone Encounter (Signed)
Informed patient of below and she stated she is still hurting. If it is not her hip then she don't know where the pain is coming from.

## 2012-09-07 NOTE — Telephone Encounter (Signed)
Message copied by Theola Sequin on Tue Sep 07, 2012  3:07 PM ------      Message from: Ronna Polio A      Created: Mon Sep 06, 2012  1:34 PM       Pt needs to have repeat BMP in 1 week after starting Losartan. Can you make sure she knows this and set up lab visit. Thanks ------

## 2012-09-08 NOTE — Telephone Encounter (Signed)
Tried calling patient cell phone, but number not in service and could not get through on home phone.

## 2012-09-10 NOTE — Telephone Encounter (Signed)
Spoke with patient and she would like to go ahead with referral to ortho

## 2012-09-14 ENCOUNTER — Other Ambulatory Visit (INDEPENDENT_AMBULATORY_CARE_PROVIDER_SITE_OTHER): Payer: Medicare Other

## 2012-09-14 ENCOUNTER — Ambulatory Visit (INDEPENDENT_AMBULATORY_CARE_PROVIDER_SITE_OTHER): Payer: Medicare Other | Admitting: *Deleted

## 2012-09-14 ENCOUNTER — Telehealth: Payer: Self-pay

## 2012-09-14 VITALS — BP 139/70 | HR 62

## 2012-09-14 DIAGNOSIS — I10 Essential (primary) hypertension: Secondary | ICD-10-CM

## 2012-09-14 DIAGNOSIS — IMO0001 Reserved for inherently not codable concepts without codable children: Secondary | ICD-10-CM

## 2012-09-14 LAB — COMPREHENSIVE METABOLIC PANEL
AST: 19 U/L (ref 0–37)
Albumin: 3.7 g/dL (ref 3.5–5.2)
Alkaline Phosphatase: 67 U/L (ref 39–117)
BUN: 13 mg/dL (ref 6–23)
Potassium: 3.9 mEq/L (ref 3.5–5.1)
Sodium: 136 mEq/L (ref 135–145)
Total Bilirubin: 0.7 mg/dL (ref 0.3–1.2)
Total Protein: 7.2 g/dL (ref 6.0–8.3)

## 2012-09-14 LAB — HEMOGLOBIN A1C: Hgb A1c MFr Bld: 7.7 % — ABNORMAL HIGH (ref 4.6–6.5)

## 2012-09-14 NOTE — Telephone Encounter (Signed)
Recommend continuing Losartan at current dose.

## 2012-09-14 NOTE — Telephone Encounter (Signed)
Patient came in today for her B/P checking 139/70 then 10 minutes later 142/ 67. Patient stated she wants to stop taking her losartan or change it, she is feeling tired and having headaches. But she feels like the reason her b/p was high was because she was having hip pain she is going to a orthopedic office to see about her help.

## 2012-09-14 NOTE — Telephone Encounter (Signed)
Notified patient per Dr.Walker that pt continue Losartan at current dose.

## 2012-09-15 ENCOUNTER — Encounter: Payer: Self-pay | Admitting: Internal Medicine

## 2012-10-06 ENCOUNTER — Ambulatory Visit: Payer: Self-pay | Admitting: Internal Medicine

## 2012-10-06 DIAGNOSIS — Z1231 Encounter for screening mammogram for malignant neoplasm of breast: Secondary | ICD-10-CM | POA: Diagnosis not present

## 2012-10-14 ENCOUNTER — Encounter: Payer: Self-pay | Admitting: Internal Medicine

## 2012-12-06 ENCOUNTER — Ambulatory Visit (INDEPENDENT_AMBULATORY_CARE_PROVIDER_SITE_OTHER): Payer: Medicare Other | Admitting: Internal Medicine

## 2012-12-06 ENCOUNTER — Encounter: Payer: Self-pay | Admitting: Internal Medicine

## 2012-12-06 VITALS — BP 120/64 | HR 97 | Temp 98.0°F | Wt 238.0 lb

## 2012-12-06 DIAGNOSIS — R002 Palpitations: Secondary | ICD-10-CM | POA: Diagnosis not present

## 2012-12-06 DIAGNOSIS — E669 Obesity, unspecified: Secondary | ICD-10-CM | POA: Insufficient documentation

## 2012-12-06 DIAGNOSIS — E1169 Type 2 diabetes mellitus with other specified complication: Secondary | ICD-10-CM | POA: Insufficient documentation

## 2012-12-06 DIAGNOSIS — I10 Essential (primary) hypertension: Secondary | ICD-10-CM | POA: Diagnosis not present

## 2012-12-06 DIAGNOSIS — E119 Type 2 diabetes mellitus without complications: Secondary | ICD-10-CM | POA: Diagnosis not present

## 2012-12-06 MED ORDER — METFORMIN HCL 1000 MG PO TABS: 1000.0000 mg | ORAL_TABLET | Freq: Two times a day (BID) | ORAL | Status: DC

## 2012-12-06 NOTE — Assessment & Plan Note (Signed)
Wt Readings from Last 3 Encounters:  12/06/12 238 lb (107.956 kg)  09/06/12 242 lb (109.77 kg)  05/10/12 232 lb 12 oz (105.575 kg)   Congratulated pt on weight loss. Encouraged continued compliance with healthy diet and regular physical activity.

## 2012-12-06 NOTE — Assessment & Plan Note (Addendum)
Intermittent palpitations consistent with PVCs. EKG showed NSR today. If symptoms persistent, discussed potentially getting Holter monitor for further evaluation with cardiology. May also consider betablocker, however pt has to date been intolerant of BP meds because of hypotension.

## 2012-12-06 NOTE — Assessment & Plan Note (Signed)
BP Readings from Last 3 Encounters:  12/06/12 120/64  09/14/12 139/70  09/06/12 152/88   Pt unable to tolerate Losartan low dose because of nausea. BP well controlled without medication. Will continue to monitor.

## 2012-12-06 NOTE — Assessment & Plan Note (Signed)
Will check A1c with labs today. Encouraged continued compliance with healthy diet and regular physical activity. Continue current medications. Foot exam normal today.

## 2012-12-06 NOTE — Progress Notes (Signed)
Subjective:    Patient ID: [name], female    DOB: [birth year], 67 y.o.   MRN: [id number]  HPI 67YO female with h/o DM, obesity, presents for follow up. Doing well. Exercising several times per week with water walking. Has lost 4lbs. No BG >250 or <70. Compliant with meds. Occasional palpitations, especially with physical activity. No chest pain, dyspnea. Symptoms described as occasional irregular beats. Symptoms resolve with rest.  Outpatient Encounter Prescriptions as of 12/06/2012  Medication Sig Dispense Refill  . aspirin 81 MG tablet Take 81 mg by mouth daily.        . insulin detemir (LEVEMIR) 100 UNIT/ML injection Inject 60 Units into the skin 2 (two) times daily. Eight boxes, three refills      . Liraglutide (VICTOZA) 18 MG/3ML SOLN Inject 0.3 mLs (1.8 mg total) into the skin daily.  27 mL  3  . metFORMIN (GLUCOPHAGE) 1000 MG tablet Take 1 tablet (1,000 mg total) by mouth 2 (two) times daily with a meal.  180 tablet  4  . nitroGLYCERIN (NITROSTAT) 0.4 MG SL tablet Place 1 tablet (0.4 mg total) under the tongue every 5 (five) minutes as needed for chest pain.  30 tablet  6  . [DISCONTINUED] metFORMIN (GLUCOPHAGE) 1000 MG tablet Take 1 tablet (1,000 mg total) by mouth 2 (two) times daily with a meal.  180 tablet  4  . [DISCONTINUED] losartan (COZAAR) 25 MG tablet Take 1 tablet (25 mg total) by mouth daily.  30 tablet  3   No facility-administered encounter medications on file as of 12/06/2012.   BP 120/64  Pulse 97  Temp(Src) 98 F (36.7 C) (Oral)  Wt 238 lb (107.956 kg)  BMI 43.52 kg/m2  SpO2 95%  Review of Systems  Constitutional: Negative for fever, chills, appetite change, fatigue and unexpected weight change.  HENT: Negative for ear pain, congestion, sore throat, trouble swallowing, neck pain, voice change and sinus pressure.   Eyes: Negative for visual disturbance.  Respiratory: Negative for cough, shortness of breath, wheezing and stridor.   Cardiovascular: Positive  for palpitations. Negative for chest pain and leg swelling.  Gastrointestinal: Negative for nausea, vomiting, abdominal pain, diarrhea, constipation, blood in stool, abdominal distention and anal bleeding.  Genitourinary: Negative for dysuria and flank pain.  Musculoskeletal: Negative for myalgias, arthralgias and gait problem.  Skin: Negative for color change and rash.  Neurological: Negative for dizziness and headaches.  Hematological: Negative for adenopathy. Does not bruise/bleed easily.  Psychiatric/Behavioral: Negative for suicidal ideas, sleep disturbance and dysphoric mood. The patient is not nervous/anxious.        Objective:   Physical Exam  Constitutional: She is oriented to person, place, and time. She appears well-developed and well-nourished. No distress.  HENT:  Head: Normocephalic and atraumatic.  Right Ear: External ear normal.  Left Ear: External ear normal.  Nose: Nose normal.  Mouth/Throat: Oropharynx is clear and moist. No oropharyngeal exudate.  Eyes: Conjunctivae are normal. Pupils are equal, round, and reactive to light. Right eye exhibits no discharge. Left eye exhibits no discharge. No scleral icterus.  Neck: Normal range of motion. Neck supple. No tracheal deviation present. No thyromegaly present.  Cardiovascular: Normal rate, regular rhythm, normal heart sounds and intact distal pulses.  Frequent extrasystoles are present. Exam reveals no gallop and no friction rub.   No murmur heard. Pulmonary/Chest: Effort normal and breath sounds normal. No accessory muscle usage. Not tachypneic. No respiratory distress. She has no decreased breath sounds. She has no wheezes.  She has no rhonchi. She has no rales. She exhibits no tenderness.  Musculoskeletal: Normal range of motion. She exhibits no edema and no tenderness.  Lymphadenopathy:    She has no cervical adenopathy.  Neurological: She is alert and oriented to person, place, and time. No cranial nerve deficit. She  exhibits normal muscle tone. Coordination normal.  Skin: Skin is warm and dry. No rash noted. She is not diaphoretic. No erythema. No pallor.  Psychiatric: She has a normal mood and affect. Her behavior is normal. Judgment and thought content normal.          Assessment & Plan:

## 2012-12-07 ENCOUNTER — Encounter: Payer: Self-pay | Admitting: Emergency Medicine

## 2012-12-07 LAB — COMPREHENSIVE METABOLIC PANEL
ALT: 18 U/L (ref 0–35)
CO2: 27 mEq/L (ref 19–32)
Calcium: 9.4 mg/dL (ref 8.4–10.5)
Chloride: 103 mEq/L (ref 96–112)
GFR: 63.89 mL/min (ref >60.00)
Glucose, Bld: 185 mg/dL — ABNORMAL HIGH (ref 70–99)
Sodium: 138 mEq/L (ref 135–145)
Total Bilirubin: 0.4 mg/dL (ref 0.3–1.2)
Total Protein: 7.4 g/dL (ref 6.0–8.3)

## 2012-12-07 LAB — HEMOGLOBIN A1C: Hgb A1c MFr Bld: 8.1 % — ABNORMAL HIGH (ref 4.6–6.5)

## 2012-12-07 NOTE — Addendum Note (Signed)
Addended by: Ronna Polio A on: 12/07/2012 11:56 AM   Modules accepted: Orders

## 2012-12-08 ENCOUNTER — Encounter: Payer: Self-pay | Admitting: Emergency Medicine

## 2012-12-08 LAB — HM DIABETES EYE EXAM: HM Diabetic Eye Exam: NORMAL

## 2012-12-13 ENCOUNTER — Telehealth: Payer: Self-pay | Admitting: Emergency Medicine

## 2012-12-13 NOTE — Telephone Encounter (Signed)
Patient called to inform us that she is seeing Dr. Renae Fickle @ [facility] tomorrow. I have cancelled the apt with Elvera Lennox is Oct.

## 2012-12-14 DIAGNOSIS — IMO0001 Reserved for inherently not codable concepts without codable children: Secondary | ICD-10-CM | POA: Diagnosis not present

## 2012-12-14 DIAGNOSIS — E669 Obesity, unspecified: Secondary | ICD-10-CM | POA: Diagnosis not present

## 2012-12-15 ENCOUNTER — Other Ambulatory Visit: Payer: Self-pay

## 2012-12-31 ENCOUNTER — Ambulatory Visit: Payer: Self-pay | Admitting: Emergency Medicine

## 2012-12-31 DIAGNOSIS — M25579 Pain in unspecified ankle and joints of unspecified foot: Secondary | ICD-10-CM | POA: Diagnosis not present

## 2012-12-31 DIAGNOSIS — M7989 Other specified soft tissue disorders: Secondary | ICD-10-CM | POA: Diagnosis not present

## 2012-12-31 DIAGNOSIS — S82899A Other fracture of unspecified lower leg, initial encounter for closed fracture: Secondary | ICD-10-CM | POA: Diagnosis not present

## 2013-01-03 DIAGNOSIS — S82899A Other fracture of unspecified lower leg, initial encounter for closed fracture: Secondary | ICD-10-CM | POA: Diagnosis not present

## 2013-01-03 DIAGNOSIS — M79609 Pain in unspecified limb: Secondary | ICD-10-CM | POA: Diagnosis not present

## 2013-01-04 ENCOUNTER — Encounter: Payer: Self-pay | Admitting: *Deleted

## 2013-01-04 ENCOUNTER — Encounter: Payer: Self-pay | Admitting: Internal Medicine

## 2013-01-06 DIAGNOSIS — IMO0001 Reserved for inherently not codable concepts without codable children: Secondary | ICD-10-CM | POA: Diagnosis not present

## 2013-01-20 ENCOUNTER — Telehealth: Payer: Self-pay | Admitting: Internal Medicine

## 2013-01-20 DIAGNOSIS — E119 Type 2 diabetes mellitus without complications: Secondary | ICD-10-CM

## 2013-01-20 NOTE — Telephone Encounter (Signed)
Yes

## 2013-01-20 NOTE — Telephone Encounter (Signed)
insulin detemir (LEVEMIR) 60 UNIT/ML injection  2 x a day for 3 months   Pens- will be replaced with Levemir Flex touch pens

## 2013-01-20 NOTE — Telephone Encounter (Signed)
Will this be the same instructions with the pen as she is taking now?

## 2013-01-21 MED ORDER — INSULIN DETEMIR 100 UNIT/ML FLEXPEN: 60.0000 [IU] | PEN_INJECTOR | Freq: Two times a day (BID) | SUBCUTANEOUS | Status: DC

## 2013-01-21 NOTE — Telephone Encounter (Signed)
Prescription sent to the pharmacy.

## 2013-01-26 DIAGNOSIS — S82899A Other fracture of unspecified lower leg, initial encounter for closed fracture: Secondary | ICD-10-CM | POA: Diagnosis not present

## 2013-01-27 DIAGNOSIS — E669 Obesity, unspecified: Secondary | ICD-10-CM | POA: Diagnosis not present

## 2013-01-27 DIAGNOSIS — IMO0001 Reserved for inherently not codable concepts without codable children: Secondary | ICD-10-CM | POA: Diagnosis not present

## 2013-01-27 DIAGNOSIS — Z794 Long term (current) use of insulin: Secondary | ICD-10-CM | POA: Diagnosis not present

## 2013-02-07 ENCOUNTER — Telehealth: Payer: Self-pay | Admitting: Internal Medicine

## 2013-02-07 NOTE — Telephone Encounter (Signed)
Pt wanted to get pneuma shot 02/09/13 when she get her flu shot is this ok

## 2013-02-08 NOTE — Telephone Encounter (Signed)
Could not leave a message at this number.

## 2013-02-09 ENCOUNTER — Ambulatory Visit (INDEPENDENT_AMBULATORY_CARE_PROVIDER_SITE_OTHER): Payer: Medicare Other | Admitting: Internal Medicine

## 2013-02-09 ENCOUNTER — Encounter: Payer: Self-pay | Admitting: Internal Medicine

## 2013-02-09 ENCOUNTER — Observation Stay: Payer: Self-pay | Admitting: Family Medicine

## 2013-02-09 VITALS — HR 84 | Temp 98.4°F

## 2013-02-09 DIAGNOSIS — Z882 Allergy status to sulfonamides status: Secondary | ICD-10-CM | POA: Diagnosis not present

## 2013-02-09 DIAGNOSIS — Z90710 Acquired absence of both cervix and uterus: Secondary | ICD-10-CM | POA: Diagnosis not present

## 2013-02-09 DIAGNOSIS — Z79899 Other long term (current) drug therapy: Secondary | ICD-10-CM | POA: Diagnosis not present

## 2013-02-09 DIAGNOSIS — Z794 Long term (current) use of insulin: Secondary | ICD-10-CM | POA: Diagnosis not present

## 2013-02-09 DIAGNOSIS — Z91041 Radiographic dye allergy status: Secondary | ICD-10-CM | POA: Diagnosis not present

## 2013-02-09 DIAGNOSIS — R079 Chest pain, unspecified: Secondary | ICD-10-CM | POA: Insufficient documentation

## 2013-02-09 DIAGNOSIS — G8929 Other chronic pain: Secondary | ICD-10-CM | POA: Diagnosis not present

## 2013-02-09 DIAGNOSIS — R0609 Other forms of dyspnea: Secondary | ICD-10-CM | POA: Diagnosis not present

## 2013-02-09 DIAGNOSIS — Z888 Allergy status to other drugs, medicaments and biological substances status: Secondary | ICD-10-CM | POA: Diagnosis not present

## 2013-02-09 DIAGNOSIS — I10 Essential (primary) hypertension: Secondary | ICD-10-CM | POA: Diagnosis not present

## 2013-02-09 DIAGNOSIS — Z7982 Long term (current) use of aspirin: Secondary | ICD-10-CM | POA: Diagnosis not present

## 2013-02-09 DIAGNOSIS — I4949 Other premature depolarization: Secondary | ICD-10-CM | POA: Diagnosis not present

## 2013-02-09 DIAGNOSIS — E119 Type 2 diabetes mellitus without complications: Secondary | ICD-10-CM | POA: Diagnosis not present

## 2013-02-09 DIAGNOSIS — Z6841 Body Mass Index (BMI) 40.0 and over, adult: Secondary | ICD-10-CM | POA: Diagnosis not present

## 2013-02-09 DIAGNOSIS — R002 Palpitations: Secondary | ICD-10-CM | POA: Diagnosis not present

## 2013-02-09 DIAGNOSIS — Z981 Arthrodesis status: Secondary | ICD-10-CM | POA: Diagnosis not present

## 2013-02-09 DIAGNOSIS — R791 Abnormal coagulation profile: Secondary | ICD-10-CM | POA: Diagnosis not present

## 2013-02-09 DIAGNOSIS — Z8249 Family history of ischemic heart disease and other diseases of the circulatory system: Secondary | ICD-10-CM | POA: Diagnosis not present

## 2013-02-09 DIAGNOSIS — Z23 Encounter for immunization: Secondary | ICD-10-CM

## 2013-02-09 DIAGNOSIS — M549 Dorsalgia, unspecified: Secondary | ICD-10-CM | POA: Diagnosis not present

## 2013-02-09 DIAGNOSIS — R0602 Shortness of breath: Secondary | ICD-10-CM | POA: Diagnosis not present

## 2013-02-09 DIAGNOSIS — Z86711 Personal history of pulmonary embolism: Secondary | ICD-10-CM | POA: Diagnosis not present

## 2013-02-09 DIAGNOSIS — E785 Hyperlipidemia, unspecified: Secondary | ICD-10-CM | POA: Diagnosis not present

## 2013-02-09 DIAGNOSIS — D126 Benign neoplasm of colon, unspecified: Secondary | ICD-10-CM | POA: Diagnosis not present

## 2013-02-09 DIAGNOSIS — R0989 Other specified symptoms and signs involving the circulatory and respiratory systems: Secondary | ICD-10-CM | POA: Diagnosis not present

## 2013-02-09 LAB — COMPREHENSIVE METABOLIC PANEL
Alkaline Phosphatase: 73 U/L (ref 50–136)
Anion Gap: 6 — ABNORMAL LOW (ref 7–16)
Calcium, Total: 8.9 mg/dL (ref 8.5–10.1)
EGFR (African American): >60
Osmolality: 282 (ref 275–301)
Potassium: 3.7 mmol/L (ref 3.5–5.1)
SGOT(AST): 25 U/L (ref 15–37)
Total Protein: 7.8 g/dL (ref 6.4–8.2)

## 2013-02-09 LAB — LIPID PANEL
Cholesterol: 177 mg/dL (ref 0–200)
Ldl Cholesterol, Calc: 84 mg/dL (ref 0–100)

## 2013-02-09 LAB — CBC
HCT: 40.5 % (ref 35.0–47.0)
HGB: 13.8 g/dL (ref 12.0–16.0)
MCV: 88 fL (ref 80–100)
RBC: 4.6 10*6/uL (ref 3.80–5.20)
RDW: 13.8 % (ref 11.5–14.5)
WBC: 12.1 10*3/uL — ABNORMAL HIGH (ref 3.6–11.0)

## 2013-02-09 LAB — CK TOTAL AND CKMB (NOT AT ARMC)
CK, Total: 84 U/L (ref 21–215)
CK-MB: 1 ng/mL (ref 0.5–3.6)
CK-MB: 0.8 ng/mL (ref 0.5–3.6)

## 2013-02-09 LAB — TROPONIN I
Troponin-I: <0.02 ng/mL
Troponin-I: <0.02 ng/mL

## 2013-02-09 LAB — PROTIME-INR: Prothrombin Time: 13.3 secs (ref 11.5–14.7)

## 2013-02-09 NOTE — Assessment & Plan Note (Signed)
Left sided chest pain, dyspnea concerning for myocardial ischemia versus PE. 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 Exam normal. EKG shows frequent PACs. Will send to ED for evaluation to include CT chest and cardiac markers, monitoring.

## 2013-02-09 NOTE — Progress Notes (Signed)
Subjective:    Patient ID: [name], female    DOB: 1945/08/05, 67 y.o.   MRN: [id number]  HPI 67YO female with h/o DM, obesity, DVT presents for acute visit, initially walked in for flu shot. Complaining of 3 days of left sided chest pain, radiating to back. Associated with dyspnea, diaphoresis, nausea, palpitations. Symptoms come and go. Made much worse by exertion. Has not taken anything for symptoms. Notably, recently had right ankle fracture and has some residual swelling right leg.  Outpatient Encounter Prescriptions as of 02/09/2013  Medication Sig Dispense Refill  . aspirin 81 MG tablet Take 81 mg by mouth daily.        . Insulin Detemir (LEVEMIR FLEXTOUCH) 100 UNIT/ML SOPN Inject 60 Units into the skin 2 (two) times daily.  10 pen  6  . insulin detemir (LEVEMIR) 100 UNIT/ML injection Inject 60 Units into the skin 2 (two) times daily. Eight boxes, three refills      . Liraglutide (VICTOZA) 18 MG/3ML SOLN Inject 0.3 mLs (1.8 mg total) into the skin daily.  27 mL  3  . metFORMIN (GLUCOPHAGE) 1000 MG tablet Take 1 tablet (1,000 mg total) by mouth 2 (two) times daily with a meal.  180 tablet  4  . nitroGLYCERIN (NITROSTAT) 0.4 MG SL tablet Place 1 tablet (0.4 mg total) under the tongue every 5 (five) minutes as needed for chest pain.  30 tablet  6   No facility-administered encounter medications on file as of 02/09/2013.   There were no vitals taken for this visit.  Review of Systems  Constitutional: Negative for fever, chills, appetite change, fatigue and unexpected weight change.  HENT: Negative for ear pain, congestion, sore throat, trouble swallowing, neck pain, voice change and sinus pressure.   Eyes: Negative for visual disturbance.  Respiratory: Positive for chest tightness and shortness of breath. Negative for cough, wheezing and stridor.   Cardiovascular: Positive for chest pain, palpitations and leg swelling.  Gastrointestinal: Negative for nausea, vomiting, abdominal pain,  diarrhea, constipation, blood in stool, abdominal distention and anal bleeding.  Genitourinary: Negative for dysuria and flank pain.  Musculoskeletal: Negative for myalgias, arthralgias and gait problem.  Skin: Negative for color change and rash.  Neurological: Negative for dizziness and headaches.  Hematological: Negative for adenopathy. Does not bruise/bleed easily.  Psychiatric/Behavioral: Negative for suicidal ideas, sleep disturbance and dysphoric mood. The patient is not nervous/anxious.        Objective:   Physical Exam  Constitutional: She is oriented to person, place, and time. She appears well-developed and well-nourished. No distress.  HENT:  Head: Normocephalic and atraumatic.  Right Ear: External ear normal.  Left Ear: External ear normal.  Nose: Nose normal.  Mouth/Throat: Oropharynx is clear and moist. No oropharyngeal exudate.  Eyes: Conjunctivae are normal. Pupils are equal, round, and reactive to light. Right eye exhibits no discharge. Left eye exhibits no discharge. No scleral icterus.  Neck: Normal range of motion. Neck supple. No tracheal deviation present. No thyromegaly present.  Cardiovascular: Normal rate, regular rhythm, normal heart sounds and intact distal pulses.  Exam reveals no gallop and no friction rub.   No murmur heard. Pulmonary/Chest: Effort normal and breath sounds normal. No accessory muscle usage. Not tachypneic. No respiratory distress. She has no decreased breath sounds. She has no wheezes. She has no rhonchi. She has no rales. She exhibits no tenderness.  Musculoskeletal: Normal range of motion. She exhibits no edema (right LE) and no tenderness.  Lymphadenopathy:  She has no cervical adenopathy.  Neurological: She is alert and oriented to person, place, and time. No cranial nerve deficit. She exhibits normal muscle tone. Coordination normal.  Skin: Skin is warm. No rash noted. She is diaphoretic. No erythema. No pallor.  Psychiatric: She has  a normal mood and affect. Her behavior is normal. Judgment and thought content normal.          Assessment & Plan:

## 2013-02-09 NOTE — Telephone Encounter (Signed)
Patient is coming in today, will give both injections to her today

## 2013-02-10 ENCOUNTER — Telehealth: Payer: Self-pay | Admitting: Internal Medicine

## 2013-02-10 ENCOUNTER — Telehealth: Payer: Self-pay

## 2013-02-10 DIAGNOSIS — E119 Type 2 diabetes mellitus without complications: Secondary | ICD-10-CM | POA: Diagnosis not present

## 2013-02-10 DIAGNOSIS — R079 Chest pain, unspecified: Secondary | ICD-10-CM | POA: Diagnosis not present

## 2013-02-10 DIAGNOSIS — I10 Essential (primary) hypertension: Secondary | ICD-10-CM | POA: Diagnosis not present

## 2013-02-10 DIAGNOSIS — R791 Abnormal coagulation profile: Secondary | ICD-10-CM | POA: Diagnosis not present

## 2013-02-10 LAB — CK TOTAL AND CKMB (NOT AT ARMC): CK-MB: 0.8 ng/mL (ref 0.5–3.6)

## 2013-02-10 LAB — TROPONIN I: Troponin-I: <0.02 ng/mL

## 2013-02-10 NOTE — Telephone Encounter (Signed)
[facility] follow up pt discharged 10/2  appointment 10/20

## 2013-02-10 NOTE — Telephone Encounter (Signed)
Pt sched for Lexiscan at [facility] 02/15/13 @ 8:00. Pt verbally informed of time and instructions and mailed letter. Pt sched to see Dr. Mariah Milling on 02/21/13.

## 2013-02-11 NOTE — Telephone Encounter (Signed)
FYI to Dr. Dan Humphreys, faxed request for notes to [facility]

## 2013-02-11 NOTE — Telephone Encounter (Deleted)
FYI to Dr. Walker 

## 2013-02-15 ENCOUNTER — Other Ambulatory Visit: Payer: Self-pay

## 2013-02-15 ENCOUNTER — Ambulatory Visit: Payer: Self-pay | Admitting: Cardiovascular Disease

## 2013-02-15 ENCOUNTER — Encounter: Payer: Medicare Other | Admitting: Cardiovascular Disease

## 2013-02-15 DIAGNOSIS — R079 Chest pain, unspecified: Secondary | ICD-10-CM

## 2013-02-18 ENCOUNTER — Encounter: Payer: Self-pay | Admitting: *Deleted

## 2013-02-21 ENCOUNTER — Encounter: Payer: Self-pay | Admitting: Cardiovascular Disease

## 2013-02-21 ENCOUNTER — Ambulatory Visit (INDEPENDENT_AMBULATORY_CARE_PROVIDER_SITE_OTHER): Payer: Medicare Other | Admitting: Cardiovascular Disease

## 2013-02-21 VITALS — BP 150/60 | HR 79 | Ht 62.0 in | Wt 237.0 lb

## 2013-02-21 DIAGNOSIS — R002 Palpitations: Secondary | ICD-10-CM

## 2013-02-21 DIAGNOSIS — R079 Chest pain, unspecified: Secondary | ICD-10-CM

## 2013-02-21 DIAGNOSIS — I10 Essential (primary) hypertension: Secondary | ICD-10-CM | POA: Diagnosis not present

## 2013-02-21 DIAGNOSIS — E669 Obesity, unspecified: Secondary | ICD-10-CM

## 2013-02-21 DIAGNOSIS — E119 Type 2 diabetes mellitus without complications: Secondary | ICD-10-CM

## 2013-02-21 DIAGNOSIS — E785 Hyperlipidemia, unspecified: Secondary | ICD-10-CM

## 2013-02-21 NOTE — Assessment & Plan Note (Signed)
Recent atypical chest pain. Negative VQ scan, negative ultrasound of the lower stone disease, recent negative stress test for ischemia. No further testing at this time.

## 2013-02-21 NOTE — Patient Instructions (Addendum)
You are doing well. No medication changes were made.  We will order a 30 day event monitor if you start having heart rhythms again  Please monitor your blood pressure at home Call the office if it runs high (less than 135/85)  Please call us if you have new issues that need to be addressed before your next appt.  Your physician wants you to follow-up in: 6 months.  You will receive a reminder letter in the mail two months in advance. If you don't receive a letter, please call our office to schedule the follow-up appointment.

## 2013-02-21 NOTE — Assessment & Plan Note (Signed)
We have encouraged continued exercise, careful diet management in an effort to lose weight. 

## 2013-02-21 NOTE — Assessment & Plan Note (Signed)
Outpatient LDL 150. Inpatient, her LDL was 84. Uncertain as to the etiology of the drop. She does not want to take a cholesterol pill. We have suggested she continue to watch her diet, decrease the weight. This will help her diabetes which will help her cholesterol.

## 2013-02-21 NOTE — Assessment & Plan Note (Signed)
She has APCs, rare PVCs. She does report having episodes of tachycardia concerning for other arrhythmia. We did discuss a 30 day monitor. She would prefer to wait at this time as symptoms are rare. She will call us if symptoms recur and we would order a 30 day monitor with delivery to her house.

## 2013-02-21 NOTE — Progress Notes (Signed)
Patient ID: [name], female    DOB: December 01, 1945, 67 y.o.   MRN: [id number]  HPI Comments: 67 yo female with h/o diabetes, obesity, hypertension, presenting to the emergency room October 2 with chest pain, angina. He had a elevated d-dimer and VQ scan was ordered which was low probability for PE. She ruled out for MI and was discharged with outpatient stress test ordered.  Stress test done as an outpatient showed no ischemia, normal ejection fraction 58%, no significant EKG changes concerning for ischemia, low risk scan. He presents today for followup after her hospital discharge.  Notes indicate her hemoglobin A1c is 8, As an outpatient, LDL is 150. He was recommended that she start on a cholesterol medication in the hospital but she has not started this yet. Interestingly inpatient cholesterol numbers on 02/09/2013 showed total cholesterol 177, LDL 84, HDL 61  She does report having occasional episodes of tachycardia at wake her in the middle of the night, rarely in the daytime. These rhythms are rapid, pounding, last for 15 minutes or so no significant events were seen on inpatient telemetry apart from APCs, PVCs.  While in the hospital, we discussed with her whether she would like a 30 day monitor.   In followup today, she reports that she feels well with no complaints. She continues to have occasional palpitations. No recent episodes of tachycardia    Outpatient Encounter Prescriptions as of 02/21/2013  Medication Sig Dispense Refill  . aspirin 325 MG tablet Take 325 mg by mouth daily.      . Insulin Detemir (LEVEMIR FLEXTOUCH) 100 UNIT/ML SOPN Inject 60 Units into the skin 2 (two) times daily.  10 pen  6  . insulin detemir (LEVEMIR) 100 UNIT/ML injection Inject 50-60 Units into the skin daily. Eight boxes, three refills      . insulin lispro (HUMALOG) 100 UNIT/ML injection Inject 4-6 Units into the skin daily.      . Liraglutide (VICTOZA) 18 MG/3ML SOLN Inject 0.3 mLs (1.8 mg total)  into the skin daily.  27 mL  3  . metFORMIN (GLUCOPHAGE) 1000 MG tablet Take 1 tablet (1,000 mg total) by mouth 2 (two) times daily with a meal.  180 tablet  4  . nitroGLYCERIN (NITROSTAT) 0.4 MG SL tablet Place 1 tablet (0.4 mg total) under the tongue every 5 (five) minutes as needed for chest pain.  30 tablet  6  . [DISCONTINUED] pravastatin (PRAVACHOL) 20 MG tablet Take 20 mg by mouth daily.        Review of Systems  Constitutional: Negative.   HENT: Negative.   Eyes: Negative.   Respiratory: Negative.   Cardiovascular: Negative.   Gastrointestinal: Negative.   Endocrine: Negative.   Musculoskeletal: Negative.   Skin: Negative.   Allergic/Immunologic: Negative.   Neurological: Negative.   Hematological: Negative.   Psychiatric/Behavioral: Negative.   All other systems reviewed and are negative.    BP 150/60  Pulse 79  Ht 5\' 2"  (1.575 m)  Wt 237 lb (107.502 kg)  BMI 43.34 kg/m2  Physical Exam  Nursing note and vitals reviewed. Constitutional: She is oriented to person, place, and time. She appears well-developed and well-nourished.  HENT:  Head: Normocephalic.  Nose: Nose normal.  Mouth/Throat: Oropharynx is clear and moist.  Eyes: Conjunctivae are normal. Pupils are equal, round, and reactive to light.  Neck: Normal range of motion. Neck supple. No JVD present.  Cardiovascular: Normal rate, regular rhythm, S1 normal, S2 normal, normal heart sounds and intact  distal pulses.  Exam reveals no gallop and no friction rub.   No murmur heard. Pulmonary/Chest: Effort normal and breath sounds normal. No respiratory distress. She has no wheezes. She has no rales. She exhibits no tenderness.  Abdominal: Soft. Bowel sounds are normal. She exhibits no distension. There is no tenderness.  Musculoskeletal: Normal range of motion. She exhibits no edema and no tenderness.  Lymphadenopathy:    She has no cervical adenopathy.  Neurological: She is alert and oriented to person, place,  and time. Coordination normal.  Skin: Skin is warm and dry. No rash noted. No erythema.  Psychiatric: She has a normal mood and affect. Her behavior is normal. Judgment and thought content normal.    Assessment and Plan

## 2013-02-21 NOTE — Assessment & Plan Note (Signed)
Encouraged careful diet modification. Recent hemoglobin A1c of 8.

## 2013-02-21 NOTE — Assessment & Plan Note (Signed)
Blood pressure is well controlled on today's visit. No changes made to the medications. 

## 2013-02-22 ENCOUNTER — Other Ambulatory Visit: Payer: Self-pay

## 2013-02-22 DIAGNOSIS — R002 Palpitations: Secondary | ICD-10-CM

## 2013-02-23 ENCOUNTER — Ambulatory Visit: Payer: Medicare Other | Admitting: Internal Medicine

## 2013-02-28 ENCOUNTER — Ambulatory Visit: Payer: Medicare Other | Admitting: Internal Medicine

## 2013-02-28 ENCOUNTER — Telehealth: Payer: Self-pay

## 2013-02-28 NOTE — Telephone Encounter (Signed)
Spoke w/ pt.  She is aware of results.  

## 2013-02-28 NOTE — Telephone Encounter (Signed)
Message copied by Marilynne Halsted on Mon Feb 28, 2013 10:20 AM ------      Message from: Antonieta Iba      Created: Sun Feb 27, 2013  6:58 PM       Normal stress myoview ------

## 2013-03-01 ENCOUNTER — Telehealth: Payer: Self-pay | Admitting: *Deleted

## 2013-03-01 NOTE — Telephone Encounter (Signed)
Spoke with pt today due to receiving a fax from [facility] about attempting to contact pt x4 for pt to receive monitor. Pt mentioned that she does not answer a lot of calls due to political calls and she mentioned that she  understood that Dr. Mariah Milling would only order monitor if she was having problems/symptoms. Monitor has been ordered but Im not sure if it was a misunderstanding and if it should be ordered or not. Please clarify with pt.

## 2013-03-03 NOTE — Telephone Encounter (Signed)
Sorry for the miscommunication In my note I have suggested we wait on a 30 day event monitor and she would let us know if she would like a monitor  My AVS also says we will order a 30 day monitor IF she starts having arrhythmia (that was for the patient)

## 2013-03-03 NOTE — Telephone Encounter (Signed)
I spoke with the patient and made her aware that she does not need a monitor unless she has symptoms. Will notify [facility].

## 2013-03-03 NOTE — Telephone Encounter (Signed)
[facility] notified that patient does not need monitor at this time.

## 2013-03-08 ENCOUNTER — Encounter: Payer: Self-pay | Admitting: *Deleted

## 2013-03-10 ENCOUNTER — Ambulatory Visit (INDEPENDENT_AMBULATORY_CARE_PROVIDER_SITE_OTHER): Payer: Medicare Other | Admitting: Internal Medicine

## 2013-03-10 ENCOUNTER — Encounter: Payer: Self-pay | Admitting: Internal Medicine

## 2013-03-10 VITALS — BP 130/64 | HR 65 | Temp 97.4°F | Ht 62.0 in | Wt 233.0 lb

## 2013-03-10 DIAGNOSIS — E785 Hyperlipidemia, unspecified: Secondary | ICD-10-CM | POA: Diagnosis not present

## 2013-03-10 DIAGNOSIS — Z780 Asymptomatic menopausal state: Secondary | ICD-10-CM | POA: Diagnosis not present

## 2013-03-10 DIAGNOSIS — D510 Vitamin B12 deficiency anemia due to intrinsic factor deficiency: Secondary | ICD-10-CM

## 2013-03-10 DIAGNOSIS — Z0000 Encounter for general adult medical examination without abnormal findings: Secondary | ICD-10-CM | POA: Diagnosis not present

## 2013-03-10 DIAGNOSIS — E039 Hypothyroidism, unspecified: Secondary | ICD-10-CM

## 2013-03-10 DIAGNOSIS — R1031 Right lower quadrant pain: Secondary | ICD-10-CM

## 2013-03-10 DIAGNOSIS — I10 Essential (primary) hypertension: Secondary | ICD-10-CM | POA: Diagnosis not present

## 2013-03-10 DIAGNOSIS — E119 Type 2 diabetes mellitus without complications: Secondary | ICD-10-CM

## 2013-03-10 DIAGNOSIS — G8929 Other chronic pain: Secondary | ICD-10-CM

## 2013-03-10 LAB — CBC WITH DIFFERENTIAL/PLATELET
Eosinophils Absolute: 0.3 10*3/uL (ref 0.0–0.7)
Eosinophils Relative: 3.2 % (ref 0.0–5.0)
HCT: 39.9 % (ref 36.0–46.0)
Hemoglobin: 13.4 g/dL (ref 12.0–15.0)
Lymphocytes Relative: 24.9 % (ref 12.0–46.0)
MCHC: 33.4 g/dL (ref 30.0–36.0)
MCV: 89.4 fl (ref 78.0–100.0)
Monocytes Absolute: 0.5 10*3/uL (ref 0.1–1.0)
Monocytes Relative: 5.1 % (ref 3.0–12.0)
Neutrophils Relative %: 66.4 % (ref 43.0–77.0)
Platelets: 398 10*3/uL (ref 150.0–400.0)
RBC: 4.47 Mil/uL (ref 3.87–5.11)
WBC: 9.3 10*3/uL (ref 4.5–10.5)

## 2013-03-10 LAB — MICROALBUMIN / CREATININE URINE RATIO
Creatinine,U: 367.9 mg/dL
Microalb Creat Ratio: 1 mg/g (ref 0.0–30.0)

## 2013-03-10 LAB — COMPREHENSIVE METABOLIC PANEL
ALT: 15 U/L (ref 0–35)
Albumin: 4 g/dL (ref 3.5–5.2)
CO2: 28 mEq/L (ref 19–32)
Calcium: 9.6 mg/dL (ref 8.4–10.5)
Chloride: 99 mEq/L (ref 96–112)
GFR: 84.42 mL/min (ref >60.00)
Glucose, Bld: 98 mg/dL (ref 70–99)
Potassium: 4.3 mEq/L (ref 3.5–5.1)
Sodium: 138 mEq/L (ref 135–145)
Total Protein: 7.3 g/dL (ref 6.0–8.3)

## 2013-03-10 LAB — HM DIABETES FOOT EXAM: HM Diabetic Foot Exam: NORMAL

## 2013-03-10 LAB — LIPID PANEL
Cholesterol: 208 mg/dL — ABNORMAL HIGH (ref 0–200)
HDL: 64.3 mg/dL (ref >39.00)

## 2013-03-10 LAB — TSH: TSH: 1.33 u[IU]/mL (ref 0.35–5.50)

## 2013-03-10 LAB — VITAMIN B12: Vitamin B-12: 230 pg/mL (ref 211–911)

## 2013-03-10 LAB — HEMOGLOBIN A1C: Hgb A1c MFr Bld: 7.1 % — ABNORMAL HIGH (ref 4.6–6.5)

## 2013-03-10 NOTE — Assessment & Plan Note (Signed)
Wt Readings from Last 3 Encounters:  03/10/13 233 lb (105.688 kg)  02/21/13 237 lb (107.502 kg)  12/06/12 238 lb (107.956 kg)   Body mass index is 42.61 kg/(m^2). Encouraged continued effort at healthy diet, regular physical activity and weight loss.

## 2013-03-10 NOTE — Assessment & Plan Note (Signed)
Several week history of right lower quadrant abdominal pain and watery diarrhea. Exam is remarkable for tenderness in RLQ but no palpable abnormalities. Question if she may have a partial small bowel obstruction given her history of partial colectomy and colostomy. Will get CT of the abdomen for further evaluation.

## 2013-03-10 NOTE — Assessment & Plan Note (Signed)
General medical exam normal today including breast exam, except as noted. PAP and pelvic deferred as normal 2012 and pt s/p hysterectomy. Mammogram UTD. Bone density testing ordered. Encouraged healthy diet and regular physical activity. Immunizations are UTD. Appropriate screening performed.

## 2013-03-10 NOTE — Assessment & Plan Note (Signed)
Pt reports BG have been well controlled. Will check A1c with labs today. Followed by Dr. Renae Fickle in endocrinology. Continue current medications.

## 2013-03-10 NOTE — Progress Notes (Signed)
Subjective:    Patient ID: [name], female    DOB: 1946/02/02, 67 y.o.   MRN: [id number]  HPI The patient is here for annual Medicare wellness examination and management of other chronic and acute problems.   The risk factors are reflected in the social history.  The roster of all physicians providing medical care to patient - is listed in the Snapshot section of the chart.  Activities of daily living:  The patient is 100% independent in all ADLs: dressing, toileting, feeding as well as independent mobility  Home safety : The patient has smoke detectors in the home. They wear seatbelts.  There are no firearms at home. There is no violence in the home.   There is no risks for hepatitis, STDs or HIV. There is no  history of blood transfusion. They have no travel history to infectious disease endemic areas of the world.  The patient has seen their dentist in the last six month. Dentist - [facility], Dr. Mora Appl They have seen their eye doctor in the last year. Opthalmology - [facility] on [address] No issues with hearing. They have deferred audiologic testing in the last year.   They do not  have excessive sun exposure. Discussed the need for sun protection: hats, long sleeves and use of sunscreen if there is significant sun exposure. Dermatologist - Dr. Orson Aloe Orthopedics - Dr. Gerrit Heck Endocrine - Dr. Renae Fickle Cardiology - Dr. Lewie Loron  Diet: the importance of a healthy diet is discussed. They do have a healthy diet. Working hard at [employer].  The benefits of regular aerobic exercise were discussed. Exercise has been limited because of right ankle injury.  Depression screen: there are no signs or vegative symptoms of depression- irritability, change in appetite, anhedonia, sadness/tearfullness. However, worried about husband's health.  Cognitive assessment: the patient manages all their financial and personal affairs and is actively engaged. They could relate  day,date,year and events.  HCPOA - son, Virl Diamond  The following portions of the patient's history were reviewed and updated as appropriate: allergies, current medications, past family history, past medical history,  past surgical history, past social history  and problem list.  Visual acuity was not assessed per patient preference since she has regular follow up with her ophthalmologist. Hearing and body mass index were assessed and reviewed.   During the course of the visit the patient was educated and counseled about appropriate screening and preventive services including : fall prevention , diabetes screening, nutrition counseling, colorectal cancer screening, and recommended immunizations.    She is also concerned about several weeks of right lower quadrant abdominal pain. Pain is described as aching or cramping. Made worse with eating. Associated with watery stools almost immediately after eating. No blood noted in the stool. No fever, chills. Mild nausea on occasion but no vomiting. She does have h/o sepsis and partial colectomy requiring colostomy years ago.  Outpatient Encounter Prescriptions as of 03/10/2013  Medication Sig Dispense Refill  . aspirin 325 MG tablet Take 325 mg by mouth daily.      . Insulin Detemir (LEVEMIR FLEXTOUCH) 100 UNIT/ML SOPN Inject 60 Units into the skin 2 (two) times daily.  10 pen  6  . insulin lispro (HUMALOG) 100 UNIT/ML injection Inject 4-6 Units into the skin daily.      . Liraglutide (VICTOZA) 18 MG/3ML SOLN Inject 0.3 mLs (1.8 mg total) into the skin daily.  27 mL  3  . metFORMIN (GLUCOPHAGE) 1000 MG tablet Take 1  tablet (1,000 mg total) by mouth 2 (two) times daily with a meal.  180 tablet  4  . nitroGLYCERIN (NITROSTAT) 0.4 MG SL tablet Place 1 tablet (0.4 mg total) under the tongue every 5 (five) minutes as needed for chest pain.  30 tablet  6  . insulin detemir (LEVEMIR) 100 UNIT/ML injection Inject 50-60 Units into the skin daily. Eight boxes, three  refills       No facility-administered encounter medications on file as of 03/10/2013.   BP 130/64  Pulse 65  Temp(Src) 97.4 F (36.3 C) (Oral)  Ht 5\' 2"  (1.575 m)  Wt 233 lb (105.688 kg)  BMI 42.61 kg/m2  SpO2 98%   Review of Systems  Constitutional: Negative for fever, chills, appetite change, fatigue and unexpected weight change.  HENT: Negative for congestion, ear pain, sinus pressure, sore throat, trouble swallowing and voice change.   Eyes: Negative for visual disturbance.  Respiratory: Negative for cough, shortness of breath, wheezing and stridor.   Cardiovascular: Negative for chest pain, palpitations and leg swelling.  Gastrointestinal: Positive for nausea, abdominal pain and diarrhea. Negative for vomiting, constipation, blood in stool, abdominal distention and anal bleeding.  Genitourinary: Negative for dysuria and flank pain.  Musculoskeletal: Negative for arthralgias, gait problem, myalgias and neck pain.  Skin: Negative for color change and rash.  Neurological: Negative for dizziness and headaches.  Hematological: Negative for adenopathy. Does not bruise/bleed easily.  Psychiatric/Behavioral: Negative for suicidal ideas, sleep disturbance and dysphoric mood. The patient is not nervous/anxious.        Objective:   Physical Exam  Constitutional: She is oriented to person, place, and time. She appears well-developed and well-nourished. No distress.  HENT:  Head: Normocephalic and atraumatic.  Right Ear: External ear normal.  Left Ear: External ear normal.  Nose: Nose normal.  Mouth/Throat: Oropharynx is clear and moist. No oropharyngeal exudate.  Eyes: Conjunctivae are normal. Pupils are equal, round, and reactive to light. Right eye exhibits no discharge. Left eye exhibits no discharge. No scleral icterus.  Neck: Normal range of motion. Neck supple. No tracheal deviation present. No thyromegaly present.  Cardiovascular: Normal rate, regular rhythm, normal heart  sounds and intact distal pulses.  Exam reveals no gallop and no friction rub.   No murmur heard. Pulmonary/Chest: Effort normal and breath sounds normal. No accessory muscle usage. Not tachypneic. No respiratory distress. She has no decreased breath sounds. She has no wheezes. She has no rhonchi. She has no rales. She exhibits no tenderness. Right breast exhibits no inverted nipple, no mass, no nipple discharge, no skin change and no tenderness. Left breast exhibits no inverted nipple, no mass, no nipple discharge, no skin change and no tenderness. Breasts are symmetrical.  Abdominal: Soft. Bowel sounds are normal. She exhibits no distension and no mass. There is tenderness (right lower abdomen). There is no rebound and no guarding.  Musculoskeletal: Normal range of motion. She exhibits no edema and no tenderness.  Lymphadenopathy:    She has no cervical adenopathy.  Neurological: She is alert and oriented to person, place, and time. No cranial nerve deficit. She exhibits normal muscle tone. Coordination normal.  Skin: Skin is warm and dry. No rash noted. She is not diaphoretic. No erythema. No pallor.  Psychiatric: She has a normal mood and affect. Her behavior is normal. Judgment and thought content normal.          Assessment & Plan:

## 2013-03-11 LAB — VITAMIN D 25 HYDROXY (VIT D DEFICIENCY, FRACTURES): Vit D, 25-Hydroxy: 29 ng/mL — ABNORMAL LOW (ref 30–89)

## 2013-03-15 DIAGNOSIS — R634 Abnormal weight loss: Secondary | ICD-10-CM | POA: Diagnosis not present

## 2013-03-15 DIAGNOSIS — R112 Nausea with vomiting, unspecified: Secondary | ICD-10-CM | POA: Diagnosis not present

## 2013-03-15 DIAGNOSIS — R198 Other specified symptoms and signs involving the digestive system and abdomen: Secondary | ICD-10-CM | POA: Diagnosis not present

## 2013-03-15 DIAGNOSIS — R1084 Generalized abdominal pain: Secondary | ICD-10-CM | POA: Diagnosis not present

## 2013-03-16 ENCOUNTER — Ambulatory Visit: Payer: Self-pay | Admitting: Internal Medicine

## 2013-03-16 DIAGNOSIS — Z13820 Encounter for screening for osteoporosis: Secondary | ICD-10-CM | POA: Diagnosis not present

## 2013-03-16 DIAGNOSIS — M899 Disorder of bone, unspecified: Secondary | ICD-10-CM | POA: Diagnosis not present

## 2013-03-16 DIAGNOSIS — Z780 Asymptomatic menopausal state: Secondary | ICD-10-CM | POA: Diagnosis not present

## 2013-03-17 ENCOUNTER — Telehealth: Payer: Self-pay | Admitting: *Deleted

## 2013-03-17 ENCOUNTER — Ambulatory Visit: Payer: Medicare Other

## 2013-03-17 NOTE — Telephone Encounter (Signed)
OK. We can give her samples of Crestor to try 5mg  if possible. Needs repeat CMP and lipids in 1 month.

## 2013-03-17 NOTE — Telephone Encounter (Signed)
Message copied by Theola Sequin on Thu Mar 17, 2013  1:56 PM ------      Message from: Ronna Polio A      Created: Mon Mar 14, 2013  4:26 PM       Based on current guidelines, she has diabetes and therefore should be on cholesterol medication regardless of levels. ------

## 2013-03-17 NOTE — Telephone Encounter (Signed)
Spoke with patient, informed her of the Dr. Dan Humphreys instructions about cholesterol. Per patient she had a problem with a cholesterol medication before and was told if she needed it again then she would be given samples. Patient is coming in tomorrow for her B-12 would like samples and recommendations then. She is going to have a CAT scan of her pelvic. Also she was by GI doctor, along with a colonoscopy and endoscopy next Wednesday.

## 2013-03-18 ENCOUNTER — Ambulatory Visit (INDEPENDENT_AMBULATORY_CARE_PROVIDER_SITE_OTHER): Payer: Medicare Other | Admitting: *Deleted

## 2013-03-18 ENCOUNTER — Ambulatory Visit: Payer: Self-pay | Admitting: Gastroenterology

## 2013-03-18 DIAGNOSIS — E538 Deficiency of other specified B group vitamins: Secondary | ICD-10-CM | POA: Diagnosis not present

## 2013-03-18 DIAGNOSIS — R112 Nausea with vomiting, unspecified: Secondary | ICD-10-CM | POA: Diagnosis not present

## 2013-03-18 DIAGNOSIS — R198 Other specified symptoms and signs involving the digestive system and abdomen: Secondary | ICD-10-CM | POA: Diagnosis not present

## 2013-03-18 DIAGNOSIS — R634 Abnormal weight loss: Secondary | ICD-10-CM | POA: Diagnosis not present

## 2013-03-18 DIAGNOSIS — R1084 Generalized abdominal pain: Secondary | ICD-10-CM | POA: Diagnosis not present

## 2013-03-18 MED ORDER — CYANOCOBALAMIN 1000 MCG/ML IJ SOLN
1000.0000 ug | Freq: Once | INTRAMUSCULAR | Status: AC
  Administered 2013-03-18: 1000 ug via INTRAMUSCULAR

## 2013-03-18 NOTE — Telephone Encounter (Signed)
Patient informed of this when she came in to get her B12 injection today and samples given

## 2013-03-21 DIAGNOSIS — Z79899 Other long term (current) drug therapy: Secondary | ICD-10-CM | POA: Diagnosis not present

## 2013-03-23 ENCOUNTER — Ambulatory Visit: Payer: Self-pay | Admitting: Gastroenterology

## 2013-03-23 DIAGNOSIS — Z91013 Allergy to seafood: Secondary | ICD-10-CM | POA: Diagnosis not present

## 2013-03-23 DIAGNOSIS — Z6841 Body Mass Index (BMI) 40.0 and over, adult: Secondary | ICD-10-CM | POA: Diagnosis not present

## 2013-03-23 DIAGNOSIS — R198 Other specified symptoms and signs involving the digestive system and abdomen: Secondary | ICD-10-CM | POA: Diagnosis not present

## 2013-03-23 DIAGNOSIS — Z888 Allergy status to other drugs, medicaments and biological substances status: Secondary | ICD-10-CM | POA: Diagnosis not present

## 2013-03-23 DIAGNOSIS — Z86010 Personal history of colonic polyps: Secondary | ICD-10-CM | POA: Diagnosis not present

## 2013-03-23 DIAGNOSIS — Z808 Family history of malignant neoplasm of other organs or systems: Secondary | ICD-10-CM | POA: Diagnosis not present

## 2013-03-23 DIAGNOSIS — Z794 Long term (current) use of insulin: Secondary | ICD-10-CM | POA: Diagnosis not present

## 2013-03-23 DIAGNOSIS — R1031 Right lower quadrant pain: Secondary | ICD-10-CM | POA: Diagnosis not present

## 2013-03-23 DIAGNOSIS — E669 Obesity, unspecified: Secondary | ICD-10-CM | POA: Diagnosis not present

## 2013-03-23 DIAGNOSIS — R112 Nausea with vomiting, unspecified: Secondary | ICD-10-CM | POA: Diagnosis not present

## 2013-03-23 DIAGNOSIS — R634 Abnormal weight loss: Secondary | ICD-10-CM | POA: Diagnosis not present

## 2013-03-23 DIAGNOSIS — K219 Gastro-esophageal reflux disease without esophagitis: Secondary | ICD-10-CM | POA: Diagnosis not present

## 2013-03-23 DIAGNOSIS — K2970 Gastritis, unspecified, without bleeding: Secondary | ICD-10-CM | POA: Diagnosis not present

## 2013-03-23 DIAGNOSIS — Z882 Allergy status to sulfonamides status: Secondary | ICD-10-CM | POA: Diagnosis not present

## 2013-03-23 DIAGNOSIS — Z91041 Radiographic dye allergy status: Secondary | ICD-10-CM | POA: Diagnosis not present

## 2013-03-23 DIAGNOSIS — E119 Type 2 diabetes mellitus without complications: Secondary | ICD-10-CM | POA: Diagnosis not present

## 2013-03-24 ENCOUNTER — Ambulatory Visit: Payer: Medicare Other

## 2013-03-25 ENCOUNTER — Ambulatory Visit (INDEPENDENT_AMBULATORY_CARE_PROVIDER_SITE_OTHER): Payer: Medicare Other | Admitting: *Deleted

## 2013-03-25 DIAGNOSIS — E538 Deficiency of other specified B group vitamins: Secondary | ICD-10-CM

## 2013-03-25 MED ORDER — CYANOCOBALAMIN 1000 MCG/ML IJ SOLN
1000.0000 ug | Freq: Once | INTRAMUSCULAR | Status: AC
  Administered 2013-03-25: 1000 ug via INTRAMUSCULAR

## 2013-04-01 DIAGNOSIS — K2940 Chronic atrophic gastritis without bleeding: Secondary | ICD-10-CM | POA: Diagnosis not present

## 2013-04-01 DIAGNOSIS — R1031 Right lower quadrant pain: Secondary | ICD-10-CM | POA: Diagnosis not present

## 2013-04-01 DIAGNOSIS — Z980 Intestinal bypass and anastomosis status: Secondary | ICD-10-CM | POA: Diagnosis not present

## 2013-04-01 DIAGNOSIS — Z86010 Personal history of colonic polyps: Secondary | ICD-10-CM | POA: Diagnosis not present

## 2013-04-04 DIAGNOSIS — E669 Obesity, unspecified: Secondary | ICD-10-CM | POA: Diagnosis not present

## 2013-04-04 DIAGNOSIS — R110 Nausea: Secondary | ICD-10-CM | POA: Diagnosis not present

## 2013-04-04 DIAGNOSIS — R1011 Right upper quadrant pain: Secondary | ICD-10-CM | POA: Diagnosis not present

## 2013-04-04 DIAGNOSIS — IMO0001 Reserved for inherently not codable concepts without codable children: Secondary | ICD-10-CM | POA: Diagnosis not present

## 2013-04-05 DIAGNOSIS — R109 Unspecified abdominal pain: Secondary | ICD-10-CM | POA: Diagnosis not present

## 2013-04-08 ENCOUNTER — Ambulatory Visit: Payer: Medicare Other

## 2013-04-18 ENCOUNTER — Other Ambulatory Visit: Payer: Medicare Other

## 2013-04-22 ENCOUNTER — Ambulatory Visit (INDEPENDENT_AMBULATORY_CARE_PROVIDER_SITE_OTHER): Payer: Medicare Other

## 2013-04-22 DIAGNOSIS — E538 Deficiency of other specified B group vitamins: Secondary | ICD-10-CM | POA: Diagnosis not present

## 2013-04-22 MED ORDER — CYANOCOBALAMIN 1000 MCG/ML IJ SOLN
1000.0000 ug | Freq: Once | INTRAMUSCULAR | Status: AC
  Administered 2013-04-22: 1000 ug via INTRAMUSCULAR

## 2013-05-19 ENCOUNTER — Ambulatory Visit (INDEPENDENT_AMBULATORY_CARE_PROVIDER_SITE_OTHER): Payer: Medicare Other | Admitting: Internal Medicine

## 2013-05-19 ENCOUNTER — Encounter: Payer: Self-pay | Admitting: Internal Medicine

## 2013-05-19 VITALS — BP 148/80 | HR 80 | Temp 98.2°F | Wt 237.0 lb

## 2013-05-19 DIAGNOSIS — Z139 Encounter for screening, unspecified: Secondary | ICD-10-CM

## 2013-05-19 DIAGNOSIS — J209 Acute bronchitis, unspecified: Secondary | ICD-10-CM

## 2013-05-19 LAB — POCT INFLUENZA A/B
INFLUENZA A, POC: NEGATIVE
Influenza B, POC: NEGATIVE

## 2013-05-19 MED ORDER — LEVOFLOXACIN 500 MG PO TABS: 500.0000 mg | ORAL_TABLET | Freq: Every day | ORAL | Status: DC

## 2013-05-19 MED ORDER — HYDROCOD POLST-CHLORPHEN POLST 10-8 MG/5ML PO LQCR: 5.0000 mL | Freq: Two times a day (BID) | ORAL | Status: DC | PRN

## 2013-05-19 NOTE — Assessment & Plan Note (Signed)
Symptoms and exam are most consistent with acute bronchitis. Will treat with Levaquin. Tussionex as needed for cough. Encouraged rest, adequate fluid intake. Followup in one week or sooner as needed. If no improvement, would favor getting chest x-ray.

## 2013-05-19 NOTE — Patient Instructions (Signed)

## 2013-05-19 NOTE — Progress Notes (Signed)
Subjective:    Patient ID: [name], female    DOB: [birth year], 68 y.o.   MRN: [id number]  HPI  68YO female with DM, HTN, HL presents for urgent visit. Initiall ill week before Christmas with congestion, cough. Symptoms improved, then this past week recurred, with cough productive of purulent, metalic-tasting sputum. Occasional shortness of breath, esp at night. Occasional low grade fever 53F. Taking OTC cough and cold meds with no improvement.  Outpatient Encounter Prescriptions as of 05/19/2013  Medication Sig  . Insulin Detemir (LEVEMIR FLEXTOUCH) 100 UNIT/ML SOPN Inject 60 Units into the skin 2 (two) times daily.  . insulin detemir (LEVEMIR) 100 UNIT/ML injection Inject 50-60 Units into the skin daily. Eight boxes, three refills  . insulin lispro (HUMALOG) 100 UNIT/ML injection Inject 4-6 Units into the skin daily. For every 15 carbs take an unit of Humalog  . metFORMIN (GLUCOPHAGE) 1000 MG tablet Take 1 tablet (1,000 mg total) by mouth 2 (two) times daily with a meal.  . nitroGLYCERIN (NITROSTAT) 0.4 MG SL tablet Place 1 tablet (0.4 mg total) under the tongue every 5 (five) minutes as needed for chest pain.  Marland Kitchen aspirin 325 MG tablet Take 325 mg by mouth daily.  . Liraglutide (VICTOZA) 18 MG/3ML SOLN Inject 0.3 mLs (1.8 mg total) into the skin daily.   Pulse 80  Temp(Src) 98.2 F (36.8 C) (Oral)  SpO2 99%   Review of Systems  Constitutional: Positive for fever and fatigue. Negative for chills and unexpected weight change.  HENT: Positive for congestion. Negative for ear discharge, ear pain, facial swelling, hearing loss, mouth sores, nosebleeds, postnasal drip, rhinorrhea, sinus pressure, sneezing, sore throat, tinnitus, trouble swallowing and voice change.   Eyes: Negative for pain, discharge, redness and visual disturbance.  Respiratory: Positive for cough and shortness of breath. Negative for chest tightness, wheezing and stridor.   Cardiovascular: Negative for chest pain,  palpitations and leg swelling.  Musculoskeletal: Negative for arthralgias, myalgias, neck pain and neck stiffness.  Skin: Negative for color change and rash.  Neurological: Negative for dizziness, weakness, light-headedness and headaches.  Hematological: Negative for adenopathy.       Objective:   Physical Exam  Constitutional: She is oriented to person, place, and time. She appears well-developed and well-nourished. No distress.  HENT:  Head: Normocephalic and atraumatic.  Right Ear: External ear normal.  Left Ear: External ear normal.  Nose: Nose normal.  Mouth/Throat: Oropharynx is clear and moist. No oropharyngeal exudate.  Eyes: Conjunctivae are normal. Pupils are equal, round, and reactive to light. Right eye exhibits no discharge. Left eye exhibits no discharge. No scleral icterus.  Neck: Normal range of motion. Neck supple. No tracheal deviation present. No thyromegaly present.  Cardiovascular: Normal rate, regular rhythm, normal heart sounds and intact distal pulses.  Exam reveals no gallop and no friction rub.   No murmur heard. Pulmonary/Chest: Effort normal. No accessory muscle usage. Not tachypneic. No respiratory distress. She has no decreased breath sounds. She has no wheezes. She has rhonchi (scattered). She has no rales. She exhibits no tenderness.  Musculoskeletal: Normal range of motion. She exhibits no edema and no tenderness.  Lymphadenopathy:    She has no cervical adenopathy.  Neurological: She is alert and oriented to person, place, and time. No cranial nerve deficit. She exhibits normal muscle tone. Coordination normal.  Skin: Skin is warm and dry. No rash noted. She is not diaphoretic. No erythema. No pallor.  Psychiatric: She has a normal mood and affect.  Her behavior is normal. Judgment and thought content normal.          Assessment & Plan:

## 2013-05-26 ENCOUNTER — Ambulatory Visit: Payer: Medicare Other | Admitting: Internal Medicine

## 2013-05-26 ENCOUNTER — Telehealth: Payer: Self-pay | Admitting: Internal Medicine

## 2013-05-26 DIAGNOSIS — J209 Acute bronchitis, unspecified: Secondary | ICD-10-CM

## 2013-05-26 MED ORDER — LEVOFLOXACIN 500 MG PO TABS: 500.0000 mg | ORAL_TABLET | Freq: Every day | ORAL | Status: DC

## 2013-05-26 NOTE — Telephone Encounter (Signed)
OK. We can refill Levaquin x 1, then she needs follow up here next week.

## 2013-05-26 NOTE — Telephone Encounter (Signed)
Mostly coughing, no fever or shortness of breath

## 2013-05-26 NOTE — Telephone Encounter (Signed)
[name], Can you ask her for more information. Any fever? Chest pain? Shortness of breath? If yes, then needs CXR.

## 2013-05-26 NOTE — Telephone Encounter (Signed)
Patient informed and verbalized understanding. Rx sent to pharmacy on file.

## 2013-05-26 NOTE — Telephone Encounter (Signed)
The patient is not better . She stated she will need one more round of antibiotics.

## 2013-06-15 ENCOUNTER — Telehealth: Payer: Self-pay | Admitting: Internal Medicine

## 2013-06-15 NOTE — Telephone Encounter (Signed)
Bone density test showed T-score of -1.2 which is consistent with osteopenia, or early weakening of the bones. We should set up an appointment to discuss treatment options.

## 2013-06-16 ENCOUNTER — Ambulatory Visit: Payer: Medicare Other | Admitting: Internal Medicine

## 2013-06-17 ENCOUNTER — Encounter: Payer: Self-pay | Admitting: *Deleted

## 2013-06-17 NOTE — Telephone Encounter (Signed)
Letter mailed to patient home address on file  

## 2013-07-20 ENCOUNTER — Telehealth: Payer: Self-pay | Admitting: *Deleted

## 2013-07-20 ENCOUNTER — Other Ambulatory Visit (INDEPENDENT_AMBULATORY_CARE_PROVIDER_SITE_OTHER): Payer: Medicare Other

## 2013-07-20 ENCOUNTER — Encounter: Payer: Self-pay | Admitting: Internal Medicine

## 2013-07-20 DIAGNOSIS — E1059 Type 1 diabetes mellitus with other circulatory complications: Secondary | ICD-10-CM | POA: Diagnosis not present

## 2013-07-20 NOTE — Telephone Encounter (Signed)
What labs and dx?  

## 2013-07-20 NOTE — Telephone Encounter (Signed)
CMP, A1c, lipids TSH 250.00

## 2013-07-21 LAB — COMPREHENSIVE METABOLIC PANEL
ALBUMIN: 4.2 g/dL (ref 3.5–5.2)
ALK PHOS: 47 U/L (ref 39–117)
ALT: 15 U/L (ref 0–35)
AST: 19 U/L (ref 0–37)
BUN: 17 mg/dL (ref 6–23)
CO2: 33 mEq/L — ABNORMAL HIGH (ref 19–32)
Calcium: 10.1 mg/dL (ref 8.4–10.5)
Chloride: 102 mEq/L (ref 96–112)
Creatinine, Ser: 0.7 mg/dL (ref 0.4–1.2)
GFR: 91.52 mL/min (ref >60.00)
Glucose, Bld: 128 mg/dL — ABNORMAL HIGH (ref 70–99)
POTASSIUM: 4.5 meq/L (ref 3.5–5.1)
SODIUM: 141 meq/L (ref 135–145)
TOTAL PROTEIN: 7.1 g/dL (ref 6.0–8.3)
Total Bilirubin: 0.5 mg/dL (ref 0.3–1.2)

## 2013-07-21 LAB — LIPID PANEL
CHOLESTEROL: 151 mg/dL (ref 0–200)
HDL: 54.5 mg/dL (ref >39.00)
LDL CALC: 73 mg/dL (ref 0–99)
Total CHOL/HDL Ratio: 3
Triglycerides: 119 mg/dL (ref 0.0–149.0)
VLDL: 23.8 mg/dL (ref 0.0–40.0)

## 2013-07-21 LAB — TSH: TSH: 1.63 u[IU]/mL (ref 0.35–5.50)

## 2013-07-21 LAB — HEMOGLOBIN A1C: Hgb A1c MFr Bld: 7.8 % — ABNORMAL HIGH (ref 4.6–6.5)

## 2013-07-21 NOTE — Telephone Encounter (Signed)
Fwd to [place] in the lab

## 2013-07-28 ENCOUNTER — Encounter: Payer: Self-pay | Admitting: Internal Medicine

## 2013-07-28 ENCOUNTER — Ambulatory Visit (INDEPENDENT_AMBULATORY_CARE_PROVIDER_SITE_OTHER): Payer: Medicare Other | Admitting: Internal Medicine

## 2013-07-28 VITALS — BP 142/70 | HR 63 | Temp 98.2°F | Wt 237.0 lb

## 2013-07-28 DIAGNOSIS — E119 Type 2 diabetes mellitus without complications: Secondary | ICD-10-CM

## 2013-07-28 DIAGNOSIS — M899 Disorder of bone, unspecified: Secondary | ICD-10-CM | POA: Diagnosis not present

## 2013-07-28 DIAGNOSIS — M8580 Other specified disorders of bone density and structure, unspecified site: Secondary | ICD-10-CM

## 2013-07-28 DIAGNOSIS — I10 Essential (primary) hypertension: Secondary | ICD-10-CM | POA: Diagnosis not present

## 2013-07-28 DIAGNOSIS — E785 Hyperlipidemia, unspecified: Secondary | ICD-10-CM

## 2013-07-28 DIAGNOSIS — M949 Disorder of cartilage, unspecified: Secondary | ICD-10-CM

## 2013-07-28 MED ORDER — ROSUVASTATIN CALCIUM 5 MG PO TABS: 5.0000 mg | ORAL_TABLET | Freq: Every day | ORAL | Status: DC

## 2013-07-28 NOTE — Patient Instructions (Signed)
Increase Vit D intake to 2000 units daily.  Continue weight bearing activity.

## 2013-07-28 NOTE — Assessment & Plan Note (Signed)
BP Readings from Last 3 Encounters:  07/28/13 142/70  05/19/13 148/80  03/10/13 130/64   BP well controlled generally without medications. Pt has had intolerance to both ACEi and ARB.

## 2013-07-28 NOTE — Assessment & Plan Note (Signed)
Significant improvement in lipids on Crestor. Will continue.

## 2013-07-28 NOTE — Assessment & Plan Note (Signed)
Recent bone density testing showed T-score of -1.2 in right femur. Discussed treatment options including supplemental Vit D, dietary calcium, and medications including bisphosphonates. Encouraged weight bearing exercise. She would prefer to avoid additional medications if possible. Will continue Vit D 2000units daily, increased dietary calcium. Plan to repeat bone density testing in 2017.

## 2013-07-28 NOTE — Progress Notes (Signed)
Pre visit review using our clinic review tool, if applicable. No additional management support is needed unless otherwise documented below in the visit note. 

## 2013-07-28 NOTE — Assessment & Plan Note (Signed)
Wt Readings from Last 3 Encounters:  07/28/13 237 lb (107.502 kg)  05/19/13 237 lb (107.502 kg)  03/10/13 233 lb (105.688 kg)   Encouraged her to continue efforts at healthy diet and regular exercise.

## 2013-07-28 NOTE — Progress Notes (Signed)
Subjective:    Patient ID: [name], female    DOB: [birth year], 68 y.o.   MRN: [id number]  HPI 68YO female with DM presents for follow up.  DM - recent A1c slightly elevated 7.8%, however has been sick recently. Compliant with meds.  Frustrated by inability to lose weight. Spending considerable time caring for husband who is ill. Trying to follow healthy diet and get regular exercise.  Review of Systems  Constitutional: Negative for fever, chills, appetite change, fatigue and unexpected weight change.  HENT: Negative for congestion, ear pain, sinus pressure, sore throat, trouble swallowing and voice change.   Eyes: Negative for visual disturbance.  Respiratory: Negative for cough, shortness of breath, wheezing and stridor.   Cardiovascular: Negative for chest pain, palpitations and leg swelling.  Gastrointestinal: Negative for nausea, vomiting, abdominal pain, diarrhea, constipation, blood in stool, abdominal distention and anal bleeding.  Genitourinary: Negative for dysuria and flank pain.  Musculoskeletal: Negative for arthralgias, gait problem, myalgias and neck pain.  Skin: Negative for color change and rash.  Neurological: Negative for dizziness and headaches.  Hematological: Negative for adenopathy. Does not bruise/bleed easily.  Psychiatric/Behavioral: Negative for suicidal ideas, sleep disturbance and dysphoric mood. The patient is not nervous/anxious.        Objective:    BP 142/70  Pulse 63  Temp(Src) 98.2 F (36.8 C) (Oral)  Wt 237 lb (107.502 kg)  SpO2 97% Physical Exam  Constitutional: She is oriented to person, place, and time. She appears well-developed and well-nourished. No distress.  HENT:  Head: Normocephalic and atraumatic.  Right Ear: External ear normal.  Left Ear: External ear normal.  Nose: Nose normal.  Mouth/Throat: Oropharynx is clear and moist. No oropharyngeal exudate.  Eyes: Conjunctivae are normal. Pupils are equal, round, and  reactive to light. Right eye exhibits no discharge. Left eye exhibits no discharge. No scleral icterus.  Neck: Normal range of motion. Neck supple. No tracheal deviation present. No thyromegaly present.  Cardiovascular: Normal rate, regular rhythm, normal heart sounds and intact distal pulses.  Exam reveals no gallop and no friction rub.   No murmur heard. Pulmonary/Chest: Effort normal and breath sounds normal. No accessory muscle usage. Not tachypneic. No respiratory distress. She has no decreased breath sounds. She has no wheezes. She has no rhonchi. She has no rales. She exhibits no tenderness.  Musculoskeletal: Normal range of motion. She exhibits no edema and no tenderness.  Lymphadenopathy:    She has no cervical adenopathy.  Neurological: She is alert and oriented to person, place, and time. No cranial nerve deficit. She exhibits normal muscle tone. Coordination normal.  Skin: Skin is warm and dry. No rash noted. She is not diaphoretic. No erythema. No pallor.  Psychiatric: She has a normal mood and affect. Her behavior is normal. Judgment and thought content normal.          Assessment & Plan:   Problem List Items Addressed This Visit   Essential hypertension, benign      BP Readings from Last 3 Encounters:  07/28/13 142/70  05/19/13 148/80  03/10/13 130/64   BP well controlled generally without medications. Pt has had intolerance to both ACEi and ARB.    Relevant Medications      rosuvastatin (CRESTOR) tablet   Hyperlipidemia     Significant improvement in lipids on Crestor. Will continue.    Morbid obesity      Wt Readings from Last 3 Encounters:  07/28/13 237 lb (107.502 kg)  05/19/13  237 lb (107.502 kg)  03/10/13 233 lb (105.688 kg)   Encouraged her to continue efforts at healthy diet and regular exercise.    Osteopenia     Recent bone density testing showed T-score of -1.2 in right femur. Discussed treatment options including supplemental Vit D, dietary  calcium, and medications including bisphosphonates. Encouraged weight bearing exercise. She would prefer to avoid additional medications if possible. Will continue Vit D 2000units daily, increased dietary calcium. Plan to repeat bone density testing in 2017.    Type II or unspecified type diabetes mellitus without mention of complication, not stated as uncontrolled - Primary      Lab Results  Component Value Date   HGBA1C 7.8* 07/21/2013   BG relatively stable. Will plan to continue Victoza, Metformin, Levemir. Follow up 3 months and prn.        Return in about 3 months (around 10/28/2013).

## 2013-07-28 NOTE — Assessment & Plan Note (Signed)
Lab Results  Component Value Date   HGBA1C 7.8* 07/21/2013   BG relatively stable. Will plan to continue Victoza, Metformin, Levemir. Follow up 3 months and prn.

## 2013-08-05 ENCOUNTER — Telehealth: Payer: Self-pay

## 2013-08-05 NOTE — Telephone Encounter (Signed)
Relevant patient education assigned to patient using Emmi. ° °

## 2013-08-26 ENCOUNTER — Telehealth: Payer: Self-pay | Admitting: Internal Medicine

## 2013-08-26 NOTE — Telephone Encounter (Signed)
Patient Information:  Caller Name: Yeslin  Phone: [phone]  Patient: [name], [name]  Gender: Female  DOB: 1945/06/23  Age: 68 Years  PCP: Ronette Deter (Adults only)  Office Follow Up:  Does the office need to follow up with this patient?: Yes  Instructions For The Office: Can patient be worked in today for right flank pain, odor to urine, frequent urination?  If not disposition due to severe pain is to see er.  Please notify patient and direct.  RN Note:  Disposition is to see in ER for severe pain.  No appts available per office preference will send note for possible work in for today.  Symptoms  Reason For Call & Symptoms: Pain to right side of back, moving down into the right groin area;  Urinating more frequently, small amts, strong odor;  Afebrile.  Feels a little nauseated;  Pain is worse today.  Pain at time of call is 8/10, states if it gets any worse she won't be able to manage it;  Tylenol and Ibuprofen have not helped today.  Reviewed Health History In EMR: Yes  Reviewed Medications In EMR: Yes  Reviewed Allergies In EMR: Yes  Reviewed Surgeries / Procedures: Yes  Date of Onset of Symptoms: 08/19/2013  Treatments Tried: Tylenol, Ibuprofen  Treatments Tried Worked: No  Guideline(s) Used:  Flank Pain  Disposition Per Guideline:   Go to ED Now  Reason For Disposition Reached:   Severe pain (e.g., excruciating, scale 8-10) and present > 1 hour  Advice Given:  N/A  RN Overrode Recommendation:  Document Patient  Advised will send note to office for work in.

## 2013-08-26 NOTE — Telephone Encounter (Signed)
Please advise 

## 2013-08-26 NOTE — Telephone Encounter (Signed)
Spoke with pt, encouraged to be seen in ED for symptoms, verbalized understanding.

## 2013-08-26 NOTE — Telephone Encounter (Signed)
Agree with ED, given severe flank pain, likely needs CT to eval for nephrolithiasis.

## 2013-09-08 ENCOUNTER — Telehealth: Payer: Self-pay | Admitting: Internal Medicine

## 2013-09-08 NOTE — Telephone Encounter (Signed)
The patient is wanting to see Dr. Gilford Rile next week . She is having back pain.

## 2013-09-08 NOTE — Telephone Encounter (Signed)
I looked at the schedule, and I really don't see a spot right now where I can work her in. We can wait to see if cancellation, or see her week after next, or have her see Raquel. I would recommend that she see Raquel this week.

## 2013-09-08 NOTE — Telephone Encounter (Signed)
Spoke with pt, has had back pain for years, has had increased pain the last 2 weeks. No new injury, history of back surgeries. Offered appointment with Raquel tomorrow or Monday for evaluation, she declined. Is taking Tylenol for the pain. Advised Dr. Gilford Rile did not have any openings currently for appointments next week and that she is out of the office until Monday. Pt requested message be sent to Dr. Gilford Rile.

## 2013-09-09 NOTE — Telephone Encounter (Signed)
Pt notified, offered to schedule appt with Raquel or Dr. Thomes Dinning next available, she declined. She will call her orthopedic doctor to schedule an appointment to be seen.

## 2013-09-28 ENCOUNTER — Telehealth: Payer: Self-pay | Admitting: *Deleted

## 2013-09-28 ENCOUNTER — Ambulatory Visit (INDEPENDENT_AMBULATORY_CARE_PROVIDER_SITE_OTHER): Payer: Medicare Other | Admitting: *Deleted

## 2013-09-28 DIAGNOSIS — E538 Deficiency of other specified B group vitamins: Secondary | ICD-10-CM | POA: Diagnosis not present

## 2013-09-28 MED ORDER — CYANOCOBALAMIN 1000 MCG/ML IJ SOLN
1000.0000 ug | Freq: Once | INTRAMUSCULAR | Status: AC
  Administered 2013-09-28: 1000 ug via INTRAMUSCULAR

## 2013-09-28 NOTE — Telephone Encounter (Signed)
Pt dropped off renewal of permanent disability parking placard. Form placed in Dr. Thomes Dinning folder. Pt would like it mailed to her and a phone call when it is being mailed.

## 2013-10-10 ENCOUNTER — Telehealth: Payer: Self-pay | Admitting: *Deleted

## 2013-10-10 ENCOUNTER — Ambulatory Visit (INDEPENDENT_AMBULATORY_CARE_PROVIDER_SITE_OTHER): Payer: Medicare Other | Admitting: Internal Medicine

## 2013-10-10 ENCOUNTER — Ambulatory Visit: Payer: Self-pay | Admitting: Internal Medicine

## 2013-10-10 ENCOUNTER — Encounter: Payer: Self-pay | Admitting: Internal Medicine

## 2013-10-10 VITALS — BP 142/76 | HR 74 | Temp 98.3°F | Ht 62.0 in | Wt 239.8 lb

## 2013-10-10 DIAGNOSIS — M545 Low back pain, unspecified: Secondary | ICD-10-CM | POA: Insufficient documentation

## 2013-10-10 DIAGNOSIS — N200 Calculus of kidney: Secondary | ICD-10-CM

## 2013-10-10 DIAGNOSIS — M79604 Pain in right leg: Secondary | ICD-10-CM | POA: Insufficient documentation

## 2013-10-10 DIAGNOSIS — R109 Unspecified abdominal pain: Secondary | ICD-10-CM

## 2013-10-10 LAB — POCT URINALYSIS DIPSTICK
Bilirubin, UA: NEGATIVE
GLUCOSE UA: NEGATIVE
Ketones, UA: NEGATIVE
Nitrite, UA: NEGATIVE
PROTEIN UA: NEGATIVE
Spec Grav, UA: 1.025
UROBILINOGEN UA: 0.2
pH, UA: 5.5

## 2013-10-10 MED ORDER — HYDROCODONE-ACETAMINOPHEN 5-325 MG PO TABS: 1.0000 | ORAL_TABLET | Freq: Three times a day (TID) | ORAL | Status: DC | PRN

## 2013-10-10 MED ORDER — CIPROFLOXACIN HCL 500 MG PO TABS: 500.0000 mg | ORAL_TABLET | Freq: Two times a day (BID) | ORAL | Status: DC

## 2013-10-10 NOTE — Telephone Encounter (Signed)
Form completed and give to her at appt today

## 2013-10-10 NOTE — Progress Notes (Signed)
Subjective:    Patient ID: [name], female    DOB: 1946-03-07, 68 y.o.   MRN: [id number]  HPI 68YO female presents for acute visit.  Flank pain - right sided flank pain off and on for months. Occasional burning with urination. Urine has foul odor at times. Taking husband's hydrocodone intermittently for pain with some improvement. No gross hematuria noted. No fever.  Review of Systems  Constitutional: Negative for fever, chills and fatigue.  Gastrointestinal: Negative for nausea, vomiting, abdominal pain, diarrhea, constipation and rectal pain.  Genitourinary: Positive for dysuria and flank pain. Negative for urgency, frequency, hematuria, decreased urine volume, vaginal bleeding, vaginal discharge, difficulty urinating, vaginal pain and pelvic pain.       Objective:    BP 142/76  Pulse 74  Temp(Src) 98.3 F (36.8 C) (Oral)  Ht 5\' 2"  (1.575 m)  Wt 239 lb 12 oz (108.75 kg)  BMI 43.84 kg/m2  SpO2 96% Physical Exam  Constitutional: She is oriented to person, place, and time. She appears well-developed and well-nourished. No distress.  HENT:  Head: Normocephalic and atraumatic.  Right Ear: External ear normal.  Left Ear: External ear normal.  Nose: Nose normal.  Mouth/Throat: Oropharynx is clear and moist. No oropharyngeal exudate.  Eyes: Conjunctivae are normal. Pupils are equal, round, and reactive to light. Right eye exhibits no discharge. Left eye exhibits no discharge. No scleral icterus.  Neck: Normal range of motion. Neck supple. No tracheal deviation present. No thyromegaly present.  Cardiovascular: Normal rate, regular rhythm, normal heart sounds and intact distal pulses.  Exam reveals no gallop and no friction rub.   No murmur heard. Pulmonary/Chest: Effort normal and breath sounds normal. No accessory muscle usage. Not tachypneic. No respiratory distress. She has no decreased breath sounds. She has no wheezes. She has no rhonchi. She has no rales. She exhibits no  tenderness.  Abdominal: She exhibits distension. There is tenderness (right flank).  Musculoskeletal: Normal range of motion. She exhibits no edema and no tenderness.  Lymphadenopathy:    She has no cervical adenopathy.  Neurological: She is alert and oriented to person, place, and time. No cranial nerve deficit. She exhibits normal muscle tone. Coordination normal.  Skin: Skin is warm and dry. No rash noted. She is not diaphoretic. No erythema. No pallor.  Psychiatric: She has a normal mood and affect. Her behavior is normal. Judgment and thought content normal.          Assessment & Plan:   Problem List Items Addressed This Visit     Unprioritized   Flank pain - Primary     Flank pain and urinalysis pos for hematuria. CT abdomen/pelvis shows right nephrolithiasis. Will set up urology evaluation. Encourage fluid intake. Hydrocodone prn severe pain. Empiric cipro and urine sent for culture.    Relevant Medications      ciprofloxacin (CIPRO) tablet      HYDROcodone-acetaminophen (NORCO/VICODIN) 5-325 MG per tablet   Other Relevant Orders      POCT Urinalysis Dipstick (Completed)      CT Abdomen Pelvis Wo Contrast      CULTURE, URINE COMPREHENSIVE      Ambulatory referral to Urology   Nephrolithiasis     Prelim result CT scan showed right sided nephrolithiasis. Likely cause of pain and hematuria. Will set up urology evaluation.    Relevant Medications      HYDROcodone-acetaminophen (NORCO/VICODIN) 5-325 MG per tablet   Other Relevant Orders      Ambulatory referral to  Urology       Return in about 1 week (around 10/17/2013) for Recheck.

## 2013-10-10 NOTE — Patient Instructions (Signed)
Start Cipro 500mg  twice daily.  Use Hydrocodone 5-325mg  up to three times daily for severe pain.  Increase fluid intake.  We will set up a CT scan today.  Follow up next week.

## 2013-10-10 NOTE — Assessment & Plan Note (Signed)
Flank pain and urinalysis pos for hematuria. CT abdomen/pelvis shows right nephrolithiasis. Will set up urology evaluation. Encourage fluid intake. Hydrocodone prn severe pain. Empiric cipro and urine sent for culture.

## 2013-10-10 NOTE — Assessment & Plan Note (Signed)
Prelim result CT scan showed right sided nephrolithiasis. Likely cause of pain and hematuria. Will set up urology evaluation.

## 2013-10-10 NOTE — Telephone Encounter (Signed)
Pt would like urology appt made with Dr Jacqlyn Larsen

## 2013-10-10 NOTE — Progress Notes (Signed)
Pre visit review using our clinic review tool, if applicable. No additional management support is needed unless otherwise documented below in the visit note. 

## 2013-10-11 ENCOUNTER — Emergency Department: Payer: Self-pay | Admitting: Emergency Medicine

## 2013-10-11 DIAGNOSIS — R109 Unspecified abdominal pain: Secondary | ICD-10-CM | POA: Diagnosis not present

## 2013-10-11 LAB — URINALYSIS, COMPLETE
BACTERIA: NONE SEEN
BILIRUBIN, UR: NEGATIVE
Blood: NEGATIVE
Glucose,UR: NEGATIVE mg/dL (ref 0–75)
KETONE: NEGATIVE
Nitrite: NEGATIVE
PROTEIN: NEGATIVE
Ph: 5 (ref 4.5–8.0)
RBC,UR: NONE SEEN /HPF (ref 0–5)
Specific Gravity: 1.02 (ref 1.003–1.030)
WBC UR: 2 /HPF (ref 0–5)

## 2013-10-11 LAB — COMPREHENSIVE METABOLIC PANEL
ALT: 24 U/L (ref 12–78)
AST: 30 U/L (ref 15–37)
Albumin: 3.9 g/dL (ref 3.4–5.0)
Alkaline Phosphatase: 51 U/L
Anion Gap: 5 — ABNORMAL LOW (ref 7–16)
BILIRUBIN TOTAL: 0.3 mg/dL (ref 0.2–1.0)
BUN: 13 mg/dL (ref 7–18)
CHLORIDE: 103 mmol/L (ref 98–107)
CREATININE: 0.85 mg/dL (ref 0.60–1.30)
Calcium, Total: 9.3 mg/dL (ref 8.5–10.1)
Co2: 29 mmol/L (ref 21–32)
EGFR (African American): >60
EGFR (Non-African Amer.): >60
GLUCOSE: 143 mg/dL — AB (ref 65–99)
Osmolality: 276 (ref 275–301)
Potassium: 3.9 mmol/L (ref 3.5–5.1)
Sodium: 137 mmol/L (ref 136–145)
Total Protein: 8.1 g/dL (ref 6.4–8.2)

## 2013-10-11 LAB — CBC
HCT: 40.2 % (ref 35.0–47.0)
HGB: 13.2 g/dL (ref 12.0–16.0)
MCH: 29.7 pg (ref 26.0–34.0)
MCHC: 32.9 g/dL (ref 32.0–36.0)
MCV: 90 fL (ref 80–100)
Platelet: 346 10*3/uL (ref 150–440)
RBC: 4.45 10*6/uL (ref 3.80–5.20)
RDW: 14.2 % (ref 11.5–14.5)
WBC: 9.2 10*3/uL (ref 3.6–11.0)

## 2013-10-12 DIAGNOSIS — R109 Unspecified abdominal pain: Secondary | ICD-10-CM | POA: Diagnosis not present

## 2013-10-12 DIAGNOSIS — N200 Calculus of kidney: Secondary | ICD-10-CM | POA: Diagnosis not present

## 2013-10-13 ENCOUNTER — Encounter: Payer: Self-pay | Admitting: Internal Medicine

## 2013-10-13 LAB — CULTURE, URINE COMPREHENSIVE

## 2013-10-18 ENCOUNTER — Ambulatory Visit (INDEPENDENT_AMBULATORY_CARE_PROVIDER_SITE_OTHER): Payer: Medicare Other | Admitting: Internal Medicine

## 2013-10-18 ENCOUNTER — Encounter: Payer: Self-pay | Admitting: Internal Medicine

## 2013-10-18 ENCOUNTER — Telehealth: Payer: Self-pay

## 2013-10-18 ENCOUNTER — Other Ambulatory Visit: Payer: Self-pay | Admitting: *Deleted

## 2013-10-18 ENCOUNTER — Telehealth: Payer: Self-pay | Admitting: Internal Medicine

## 2013-10-18 VITALS — BP 144/60 | HR 77 | Temp 98.2°F | Ht 62.0 in | Wt 239.5 lb

## 2013-10-18 DIAGNOSIS — N200 Calculus of kidney: Secondary | ICD-10-CM | POA: Diagnosis not present

## 2013-10-18 DIAGNOSIS — R109 Unspecified abdominal pain: Secondary | ICD-10-CM

## 2013-10-18 DIAGNOSIS — M545 Low back pain, unspecified: Secondary | ICD-10-CM

## 2013-10-18 MED ORDER — HYDROCODONE-ACETAMINOPHEN 5-325 MG PO TABS: 1.0000 | ORAL_TABLET | Freq: Three times a day (TID) | ORAL | Status: DC | PRN

## 2013-10-18 MED ORDER — ALPRAZOLAM 0.25 MG PO TABS: ORAL_TABLET | ORAL | Status: DC

## 2013-10-18 NOTE — Assessment & Plan Note (Signed)
Right sided flank and hip pain persistent and now severe. Will get MRI lumbar spine for further evaluation. Continue Hydrocodone prn severe pain. Will set up orthopedics evaluation given h/o lumbar fusion in the past.

## 2013-10-18 NOTE — Progress Notes (Signed)
Subjective:    Patient ID: [name], female    DOB: 1945/05/28, 68 y.o.   MRN: [id number]  HPI 68YO female presents for follow up. Recently diagnosed with nephrolithiasis with 66mm stone on left. However, has severe right flank pain.  Was seen by [facility] Urologist did not feel that pain was coming from stone. Plans to follow before deciding about intervention for stone. Continues to have right sided flank pain. Excruciating at times. Radiates to anterior right hip. Feels tired. Feels like legs will give out on her. Occasional feelings of numbness in right leg. Has h/o lumbar spinal fusion in the past. Taking Hydrocodone for pain with minimal improvement.    Review of Systems  Constitutional: Positive for fatigue. Negative for fever and chills.  Respiratory: Negative for shortness of breath.   Cardiovascular: Negative for chest pain.  Gastrointestinal: Positive for abdominal pain (right lower). Negative for nausea, vomiting, diarrhea, constipation and abdominal distention.  Genitourinary: Negative for dysuria, urgency, frequency and hematuria.  Musculoskeletal: Positive for arthralgias, back pain and myalgias.  Neurological: Positive for weakness and numbness.       Objective:    BP 144/60  Pulse 77  Temp(Src) 98.2 F (36.8 C) (Oral)  Ht 5\' 2"  (1.575 m)  Wt 239 lb 8 oz (108.636 kg)  BMI 43.79 kg/m2  SpO2 96% Physical Exam  Constitutional: She is oriented to person, place, and time. She appears well-developed and well-nourished. No distress.  HENT:  Head: Normocephalic and atraumatic.  Right Ear: External ear normal.  Left Ear: External ear normal.  Nose: Nose normal.  Mouth/Throat: Oropharynx is clear and moist.  Eyes: Conjunctivae are normal. Pupils are equal, round, and reactive to light. Right eye exhibits no discharge. Left eye exhibits no discharge. No scleral icterus.  Neck: Normal range of motion. Neck supple. No tracheal deviation present.  No thyromegaly present.  Cardiovascular: Normal rate, regular rhythm, normal heart sounds and intact distal pulses.  Exam reveals no gallop and no friction rub.   No murmur heard. Pulmonary/Chest: Effort normal and breath sounds normal. No accessory muscle usage. Not tachypneic. No respiratory distress. She has no decreased breath sounds. She has no wheezes. She has no rhonchi. She has no rales. She exhibits no tenderness.  Musculoskeletal: She exhibits no edema.       Lumbar back: She exhibits decreased range of motion, tenderness, pain and spasm.       Back:  Lymphadenopathy:    She has no cervical adenopathy.  Neurological: She is alert and oriented to person, place, and time. No cranial nerve deficit. She exhibits normal muscle tone. Coordination normal.  Skin: Skin is warm and dry. No rash noted. She is not diaphoretic. No erythema. No pallor.  Psychiatric: She has a normal mood and affect. Her behavior is normal. Judgment and thought content normal.          Assessment & Plan:   Problem List Items Addressed This Visit     Unprioritized   Low back pain - Primary   Relevant Medications      HYDROcodone-acetaminophen (NORCO/VICODIN) 5-325 MG per tablet   Other Relevant Orders      Ambulatory referral to Orthopedic Surgery      MR Lumbar Spine Wo Contrast   Low back pain radiating to right leg     Right sided flank and hip pain persistent and now severe. Will get MRI lumbar spine for further evaluation. Continue Hydrocodone prn severe pain. Will set up  orthopedics evaluation given h/o lumbar fusion in the past.    Relevant Medications      HYDROcodone-acetaminophen (NORCO/VICODIN) 5-325 MG per tablet   Nephrolithiasis     Left sided kidney stone noted on recent CT. Follow up with urology as scheduled today.    Relevant Medications      HYDROcodone-acetaminophen (NORCO/VICODIN) 5-325 MG per tablet       Return in about 4 weeks (around 11/15/2013).

## 2013-10-18 NOTE — Progress Notes (Signed)
Pre visit review using our clinic review tool, if applicable. No additional management support is needed unless otherwise documented below in the visit note. 

## 2013-10-18 NOTE — Telephone Encounter (Signed)
Tried to schedule this patient for urology ref, [employer], spoke with Maudie Mercury - she states she tried to schedule the pt an appointment, however, the patient stated she had already seen a urologist for flank pain and declined a future apt.

## 2013-10-18 NOTE — Telephone Encounter (Signed)
Please call in Alprazolam 0.25mg  po x 1 taken 1 hr prior to procedure. May repeat x 1 at time of procedure. Disp 2. She should not drive while on this medicine

## 2013-10-18 NOTE — Telephone Encounter (Signed)
Pt states she has received the call with her appt for MRI.  States they only have the small machine and she is claustrophobic.  States she was told to call her PCP and ask for something for anxiety.  Pt states she will need this urgently as the appt is tomorrow at 11:00 a.m.  [facility] in [place].  Would like a call when this is complete so she can be prepared for appt.

## 2013-10-18 NOTE — Assessment & Plan Note (Signed)
Left sided kidney stone noted on recent CT. Follow up with urology as scheduled today.

## 2013-10-18 NOTE — Patient Instructions (Signed)
We will schedule MRI lumbar spine and evaluation with a spine specialist.  Continue Hydrocodone as needed for severe pain.  Follow up here in 4 weeks.

## 2013-10-19 ENCOUNTER — Ambulatory Visit: Payer: Self-pay | Admitting: Internal Medicine

## 2013-10-19 DIAGNOSIS — M5137 Other intervertebral disc degeneration, lumbosacral region: Secondary | ICD-10-CM | POA: Diagnosis not present

## 2013-10-19 DIAGNOSIS — M545 Low back pain, unspecified: Secondary | ICD-10-CM | POA: Diagnosis not present

## 2013-10-19 DIAGNOSIS — M48061 Spinal stenosis, lumbar region without neurogenic claudication: Secondary | ICD-10-CM | POA: Diagnosis not present

## 2013-10-19 DIAGNOSIS — M47817 Spondylosis without myelopathy or radiculopathy, lumbosacral region: Secondary | ICD-10-CM | POA: Diagnosis not present

## 2013-10-19 NOTE — Telephone Encounter (Signed)
Faxed Rx

## 2013-10-20 ENCOUNTER — Telehealth: Payer: Self-pay | Admitting: Internal Medicine

## 2013-10-20 NOTE — Telephone Encounter (Signed)
MRI lumbar spine showed spinal stenosis and possible impingement of the nerve at L3 on the left. However, no major changes since 2013. I would recommend evaluation with orthopedics as planned.

## 2013-10-20 NOTE — Telephone Encounter (Signed)
No answer, unable to leave vm on cell, home phone disconnected

## 2013-10-21 ENCOUNTER — Ambulatory Visit: Payer: Medicare Other | Admitting: Internal Medicine

## 2013-10-25 NOTE — Telephone Encounter (Signed)
Notified pt. 

## 2013-11-02 ENCOUNTER — Ambulatory Visit: Payer: Medicare Other | Admitting: Internal Medicine

## 2013-11-04 ENCOUNTER — Ambulatory Visit (INDEPENDENT_AMBULATORY_CARE_PROVIDER_SITE_OTHER): Payer: Medicare Other | Admitting: Internal Medicine

## 2013-11-04 ENCOUNTER — Encounter: Payer: Self-pay | Admitting: Internal Medicine

## 2013-11-04 VITALS — BP 152/80 | HR 70 | Temp 98.7°F | Ht 62.0 in | Wt 241.5 lb

## 2013-11-04 DIAGNOSIS — I10 Essential (primary) hypertension: Secondary | ICD-10-CM | POA: Diagnosis not present

## 2013-11-04 DIAGNOSIS — Z1239 Encounter for other screening for malignant neoplasm of breast: Secondary | ICD-10-CM

## 2013-11-04 DIAGNOSIS — M79604 Pain in right leg: Secondary | ICD-10-CM

## 2013-11-04 DIAGNOSIS — Z79899 Other long term (current) drug therapy: Secondary | ICD-10-CM | POA: Diagnosis not present

## 2013-11-04 DIAGNOSIS — E538 Deficiency of other specified B group vitamins: Secondary | ICD-10-CM | POA: Diagnosis not present

## 2013-11-04 DIAGNOSIS — E785 Hyperlipidemia, unspecified: Secondary | ICD-10-CM | POA: Diagnosis not present

## 2013-11-04 DIAGNOSIS — R109 Unspecified abdominal pain: Secondary | ICD-10-CM

## 2013-11-04 DIAGNOSIS — M545 Low back pain, unspecified: Secondary | ICD-10-CM

## 2013-11-04 DIAGNOSIS — E119 Type 2 diabetes mellitus without complications: Secondary | ICD-10-CM

## 2013-11-04 LAB — HM DIABETES FOOT EXAM: HM Diabetic Foot Exam: NORMAL

## 2013-11-04 LAB — HEMOGLOBIN A1C
Hgb A1c MFr Bld: 7.2 % — ABNORMAL HIGH (ref <5.7)
Mean Plasma Glucose: 160 mg/dL — ABNORMAL HIGH (ref <117)

## 2013-11-04 MED ORDER — HYDROCODONE-ACETAMINOPHEN 5-325 MG PO TABS: 1.0000 | ORAL_TABLET | Freq: Three times a day (TID) | ORAL | Status: DC | PRN

## 2013-11-04 MED ORDER — CYANOCOBALAMIN 1000 MCG/ML IJ SOLN
1000.0000 ug | Freq: Once | INTRAMUSCULAR | Status: AC
  Administered 2013-11-04: 1000 ug via INTRAMUSCULAR

## 2013-11-04 NOTE — Assessment & Plan Note (Signed)
BP Readings from Last 3 Encounters:  11/04/13 152/80  10/18/13 144/60  10/10/13 142/76   BP elevated today, however pt reports significant pain in lower back. Reports BP better controlled at home. Has had hypotension in past with BP meds. Plan recheck next visit. Consider repeat trial of ACE.

## 2013-11-04 NOTE — Patient Instructions (Signed)
Labs today.  Follow up in 3 months or sooner as needed. 

## 2013-11-04 NOTE — Progress Notes (Signed)
Subjective:    Patient ID: [name], female    DOB: [birth year], 68 y.o.   MRN: [id number]  HPI 68YO female presents for follow up.  DM - BG running near 120 or less fasting, with only a couple of BG in the 200s. Compliant with meds. Trying to follow healthy diet. No exercise at present.  Low back pain - Persistent, aching pain in lower back, sometimes severe. Radiates to right leg. Scheduled to see spine specialist at [facility]. Typically using Vicodine 1-2 times per day. Drowsiness noted on this medication.  Review of Systems  Constitutional: Negative for fever, chills, appetite change, fatigue and unexpected weight change.  Eyes: Negative for visual disturbance.  Respiratory: Negative for shortness of breath.   Cardiovascular: Negative for chest pain and leg swelling.  Gastrointestinal: Negative for abdominal pain.  Musculoskeletal: Positive for arthralgias, back pain and myalgias.  Skin: Negative for color change and rash.  Hematological: Negative for adenopathy. Does not bruise/bleed easily.  Psychiatric/Behavioral: Negative for dysphoric mood. The patient is not nervous/anxious.        Objective:    BP 152/80  Pulse 70  Temp(Src) 98.7 F (37.1 C) (Oral)  Ht 5\' 2"  (1.575 m)  Wt 241 lb 8 oz (109.544 kg)  BMI 44.16 kg/m2  SpO2 97% Physical Exam  Constitutional: She is oriented to person, place, and time. She appears well-developed and well-nourished. No distress.  HENT:  Head: Normocephalic and atraumatic.  Right Ear: External ear normal.  Left Ear: External ear normal.  Nose: Nose normal.  Mouth/Throat: Oropharynx is clear and moist. No oropharyngeal exudate.  Eyes: Conjunctivae are normal. Pupils are equal, round, and reactive to light. Right eye exhibits no discharge. Left eye exhibits no discharge. No scleral icterus.  Neck: Normal range of motion. Neck supple. No tracheal deviation present. No thyromegaly present.  Cardiovascular: Normal rate, regular  rhythm, normal heart sounds and intact distal pulses.  Exam reveals no gallop and no friction rub.   No murmur heard. Pulmonary/Chest: Effort normal and breath sounds normal. No accessory muscle usage. Not tachypneic. No respiratory distress. She has no decreased breath sounds. She has no wheezes. She has no rhonchi. She has no rales. She exhibits no tenderness.  Musculoskeletal: She exhibits no edema.       Lumbar back: She exhibits decreased range of motion, tenderness and pain. She exhibits no bony tenderness.  Lymphadenopathy:    She has no cervical adenopathy.  Neurological: She is alert and oriented to person, place, and time. No cranial nerve deficit. She exhibits normal muscle tone. Coordination normal.  Skin: Skin is warm and dry. No rash noted. She is not diaphoretic. No erythema. No pallor.  Psychiatric: She has a normal mood and affect. Her behavior is normal. Judgment and thought content normal.          Assessment & Plan:   Problem List Items Addressed This Visit     Unprioritized   Essential hypertension, benign      BP Readings from Last 3 Encounters:  11/04/13 152/80  10/18/13 144/60  10/10/13 142/76   BP elevated today, however pt reports significant pain in lower back. Reports BP better controlled at home. Has had hypotension in past with BP meds. Plan recheck next visit. Consider repeat trial of ACE.    Hyperlipidemia     Will check lipids with labs today. Continue Crestor.    Low back pain radiating to right leg     MRI lumbar  spine showed chronic DJD. Evaluation with [facility] pending. Will continue prn Hydrocodone for severe pain only.    Relevant Medications      HYDROcodone-acetaminophen (NORCO/VICODIN) 5-325 MG per tablet   Morbid obesity      Wt Readings from Last 3 Encounters:  11/04/13 241 lb 8 oz (109.544 kg)  10/18/13 239 lb 8 oz (108.636 kg)  10/10/13 239 lb 12 oz (108.75 kg)  Body mass index is 44.16 kg/(m^2).  Encouraged healthy diet and  exercise with goal of weight loss.    Type II or unspecified type diabetes mellitus without mention of complication, not stated as uncontrolled - Primary     Will check A1c with labs today. Continue Metformin, Humalog, and Levemir. Foot exam normal today. Opthalmology evaluation pending.    Relevant Orders      Comprehensive metabolic panel      Hemoglobin A1c      Lipid panel      Microalbumin / creatinine urine ratio    Other Visit Diagnoses   Flank pain        Relevant Medications       HYDROcodone-acetaminophen (NORCO/VICODIN) 5-325 MG per tablet    Screening for breast cancer        Relevant Orders       MM Digital Screening    B12 deficiency        Relevant Medications       cyanocobalamin ((VITAMIN B-12)) injection 1,000 mcg (Completed)        Return in about 3 months (around 02/04/2014) for Recheck of Diabetes.

## 2013-11-04 NOTE — Assessment & Plan Note (Signed)
MRI lumbar spine showed chronic DJD. Evaluation with [facility] pending. Will continue prn Hydrocodone for severe pain only.

## 2013-11-04 NOTE — Assessment & Plan Note (Signed)
Will check lipids with labs today. Continue Crestor.

## 2013-11-04 NOTE — Progress Notes (Signed)
Pre visit review using our clinic review tool, if applicable. No additional management support is needed unless otherwise documented below in the visit note. 

## 2013-11-04 NOTE — Assessment & Plan Note (Signed)
Will check A1c with labs today. Continue Metformin, Humalog, and Levemir. Foot exam normal today. Opthalmology evaluation pending.

## 2013-11-04 NOTE — Assessment & Plan Note (Signed)
Wt Readings from Last 3 Encounters:  11/04/13 241 lb 8 oz (109.544 kg)  10/18/13 239 lb 8 oz (108.636 kg)  10/10/13 239 lb 12 oz (108.75 kg)  Body mass index is 44.16 kg/(m^2).  Encouraged healthy diet and exercise with goal of weight loss.

## 2013-11-05 LAB — MICROALBUMIN / CREATININE URINE RATIO
Creatinine, Urine: 214.8 mg/dL
MICROALB UR: 3.51 mg/dL — AB (ref 0.00–1.89)
Microalb Creat Ratio: 16.3 mg/g (ref 0.0–30.0)

## 2013-11-05 LAB — LIPID PANEL
CHOL/HDL RATIO: 3.5 ratio
Cholesterol: 195 mg/dL (ref 0–200)
HDL: 55 mg/dL (ref >39)
LDL Cholesterol: 115 mg/dL — ABNORMAL HIGH (ref 0–99)
Triglycerides: 124 mg/dL (ref <150)
VLDL: 25 mg/dL (ref 0–40)

## 2013-11-05 LAB — COMPREHENSIVE METABOLIC PANEL
ALT: 14 U/L (ref 0–35)
AST: 18 U/L (ref 0–37)
Albumin: 4.4 g/dL (ref 3.5–5.2)
Alkaline Phosphatase: 45 U/L (ref 39–117)
BUN: 18 mg/dL (ref 6–23)
CO2: 27 mEq/L (ref 19–32)
Calcium: 9.6 mg/dL (ref 8.4–10.5)
Chloride: 100 mEq/L (ref 96–112)
Creat: 0.71 mg/dL (ref 0.50–1.10)
Glucose, Bld: 49 mg/dL — ABNORMAL LOW (ref 70–99)
Potassium: 4.3 mEq/L (ref 3.5–5.3)
Sodium: 141 mEq/L (ref 135–145)
TOTAL PROTEIN: 7.3 g/dL (ref 6.0–8.3)
Total Bilirubin: 0.3 mg/dL (ref 0.2–1.2)

## 2013-11-07 ENCOUNTER — Telehealth: Payer: Self-pay | Admitting: Internal Medicine

## 2013-11-07 NOTE — Telephone Encounter (Signed)
Relevant patient education assigned to patient using Emmi. ° °

## 2013-11-07 NOTE — Progress Notes (Signed)
Pt states that she feels that she has a kidney stone and has an appt scheduled with urology tomorrow, 11/07/13.  She would like to know if it would be okay to call us tomorrow after her appt there and tell us what her blood pressure reading was.

## 2013-11-08 ENCOUNTER — Encounter: Payer: Self-pay | Admitting: Internal Medicine

## 2013-11-08 DIAGNOSIS — R310 Gross hematuria: Secondary | ICD-10-CM | POA: Diagnosis not present

## 2013-11-08 DIAGNOSIS — R109 Unspecified abdominal pain: Secondary | ICD-10-CM | POA: Diagnosis not present

## 2013-11-08 DIAGNOSIS — R32 Unspecified urinary incontinence: Secondary | ICD-10-CM | POA: Diagnosis not present

## 2013-11-08 DIAGNOSIS — N200 Calculus of kidney: Secondary | ICD-10-CM | POA: Diagnosis not present

## 2013-11-10 DIAGNOSIS — IMO0002 Reserved for concepts with insufficient information to code with codable children: Secondary | ICD-10-CM | POA: Diagnosis not present

## 2013-11-16 DIAGNOSIS — R310 Gross hematuria: Secondary | ICD-10-CM | POA: Diagnosis not present

## 2013-11-16 DIAGNOSIS — N200 Calculus of kidney: Secondary | ICD-10-CM | POA: Diagnosis not present

## 2013-11-16 DIAGNOSIS — R32 Unspecified urinary incontinence: Secondary | ICD-10-CM | POA: Diagnosis not present

## 2013-11-18 DIAGNOSIS — R310 Gross hematuria: Secondary | ICD-10-CM | POA: Diagnosis not present

## 2013-11-22 ENCOUNTER — Encounter: Payer: Self-pay | Admitting: Internal Medicine

## 2013-11-24 DIAGNOSIS — IMO0002 Reserved for concepts with insufficient information to code with codable children: Secondary | ICD-10-CM | POA: Diagnosis not present

## 2013-11-24 DIAGNOSIS — R262 Difficulty in walking, not elsewhere classified: Secondary | ICD-10-CM | POA: Diagnosis not present

## 2013-12-02 ENCOUNTER — Encounter: Payer: Self-pay | Admitting: *Deleted

## 2013-12-02 NOTE — Progress Notes (Signed)
Chart reviewed for DM bundle. Appt sch 02/07/14, due for labs then

## 2013-12-05 DIAGNOSIS — R262 Difficulty in walking, not elsewhere classified: Secondary | ICD-10-CM | POA: Diagnosis not present

## 2013-12-05 DIAGNOSIS — IMO0002 Reserved for concepts with insufficient information to code with codable children: Secondary | ICD-10-CM | POA: Diagnosis not present

## 2013-12-06 DIAGNOSIS — R3981 Functional urinary incontinence: Secondary | ICD-10-CM | POA: Diagnosis not present

## 2013-12-06 DIAGNOSIS — Z87898 Personal history of other specified conditions: Secondary | ICD-10-CM | POA: Diagnosis not present

## 2013-12-06 DIAGNOSIS — Z79899 Other long term (current) drug therapy: Secondary | ICD-10-CM | POA: Diagnosis not present

## 2013-12-06 DIAGNOSIS — Z5181 Encounter for therapeutic drug level monitoring: Secondary | ICD-10-CM | POA: Diagnosis not present

## 2013-12-06 DIAGNOSIS — E119 Type 2 diabetes mellitus without complications: Secondary | ICD-10-CM | POA: Diagnosis not present

## 2013-12-06 DIAGNOSIS — R109 Unspecified abdominal pain: Secondary | ICD-10-CM | POA: Diagnosis not present

## 2013-12-06 DIAGNOSIS — Z794 Long term (current) use of insulin: Secondary | ICD-10-CM | POA: Diagnosis not present

## 2013-12-06 DIAGNOSIS — N390 Urinary tract infection, site not specified: Secondary | ICD-10-CM | POA: Diagnosis not present

## 2013-12-06 DIAGNOSIS — R1032 Left lower quadrant pain: Secondary | ICD-10-CM | POA: Diagnosis not present

## 2013-12-06 DIAGNOSIS — N200 Calculus of kidney: Secondary | ICD-10-CM | POA: Diagnosis not present

## 2013-12-06 DIAGNOSIS — N201 Calculus of ureter: Secondary | ICD-10-CM | POA: Diagnosis not present

## 2013-12-06 DIAGNOSIS — M129 Arthropathy, unspecified: Secondary | ICD-10-CM | POA: Diagnosis not present

## 2013-12-06 DIAGNOSIS — R1084 Generalized abdominal pain: Secondary | ICD-10-CM | POA: Diagnosis not present

## 2013-12-06 DIAGNOSIS — Z7901 Long term (current) use of anticoagulants: Secondary | ICD-10-CM | POA: Diagnosis not present

## 2013-12-07 DIAGNOSIS — R1084 Generalized abdominal pain: Secondary | ICD-10-CM | POA: Diagnosis not present

## 2013-12-07 DIAGNOSIS — R3981 Functional urinary incontinence: Secondary | ICD-10-CM | POA: Diagnosis not present

## 2013-12-07 DIAGNOSIS — N390 Urinary tract infection, site not specified: Secondary | ICD-10-CM | POA: Diagnosis not present

## 2013-12-07 DIAGNOSIS — R1032 Left lower quadrant pain: Secondary | ICD-10-CM | POA: Diagnosis not present

## 2013-12-07 DIAGNOSIS — N201 Calculus of ureter: Secondary | ICD-10-CM | POA: Diagnosis not present

## 2014-01-27 DIAGNOSIS — R42 Dizziness and giddiness: Secondary | ICD-10-CM | POA: Diagnosis not present

## 2014-01-27 DIAGNOSIS — R51 Headache: Secondary | ICD-10-CM | POA: Diagnosis not present

## 2014-01-27 DIAGNOSIS — R262 Difficulty in walking, not elsewhere classified: Secondary | ICD-10-CM | POA: Diagnosis not present

## 2014-01-27 DIAGNOSIS — Z86718 Personal history of other venous thrombosis and embolism: Secondary | ICD-10-CM | POA: Diagnosis not present

## 2014-01-27 DIAGNOSIS — R110 Nausea: Secondary | ICD-10-CM | POA: Diagnosis not present

## 2014-01-27 DIAGNOSIS — H81399 Other peripheral vertigo, unspecified ear: Secondary | ICD-10-CM | POA: Diagnosis present

## 2014-01-27 DIAGNOSIS — E119 Type 2 diabetes mellitus without complications: Secondary | ICD-10-CM | POA: Diagnosis present

## 2014-01-27 DIAGNOSIS — M1990 Unspecified osteoarthritis, unspecified site: Secondary | ICD-10-CM | POA: Diagnosis present

## 2014-01-27 DIAGNOSIS — R112 Nausea with vomiting, unspecified: Secondary | ICD-10-CM | POA: Diagnosis not present

## 2014-01-27 DIAGNOSIS — H8110 Benign paroxysmal vertigo, unspecified ear: Secondary | ICD-10-CM | POA: Diagnosis present

## 2014-01-28 DIAGNOSIS — R262 Difficulty in walking, not elsewhere classified: Secondary | ICD-10-CM | POA: Diagnosis not present

## 2014-01-28 DIAGNOSIS — M1990 Unspecified osteoarthritis, unspecified site: Secondary | ICD-10-CM | POA: Diagnosis not present

## 2014-01-28 DIAGNOSIS — H8110 Benign paroxysmal vertigo, unspecified ear: Secondary | ICD-10-CM | POA: Diagnosis not present

## 2014-01-28 DIAGNOSIS — H81399 Other peripheral vertigo, unspecified ear: Secondary | ICD-10-CM | POA: Diagnosis not present

## 2014-01-28 DIAGNOSIS — R42 Dizziness and giddiness: Secondary | ICD-10-CM | POA: Diagnosis not present

## 2014-01-28 DIAGNOSIS — Z86718 Personal history of other venous thrombosis and embolism: Secondary | ICD-10-CM | POA: Diagnosis not present

## 2014-01-28 DIAGNOSIS — E119 Type 2 diabetes mellitus without complications: Secondary | ICD-10-CM | POA: Diagnosis not present

## 2014-01-29 DIAGNOSIS — H81399 Other peripheral vertigo, unspecified ear: Secondary | ICD-10-CM | POA: Diagnosis not present

## 2014-01-29 DIAGNOSIS — E119 Type 2 diabetes mellitus without complications: Secondary | ICD-10-CM | POA: Diagnosis not present

## 2014-01-29 DIAGNOSIS — R42 Dizziness and giddiness: Secondary | ICD-10-CM | POA: Diagnosis not present

## 2014-01-29 DIAGNOSIS — M1990 Unspecified osteoarthritis, unspecified site: Secondary | ICD-10-CM | POA: Diagnosis not present

## 2014-01-29 DIAGNOSIS — R262 Difficulty in walking, not elsewhere classified: Secondary | ICD-10-CM | POA: Diagnosis not present

## 2014-01-29 DIAGNOSIS — Z86718 Personal history of other venous thrombosis and embolism: Secondary | ICD-10-CM | POA: Diagnosis not present

## 2014-01-29 DIAGNOSIS — H8110 Benign paroxysmal vertigo, unspecified ear: Secondary | ICD-10-CM | POA: Diagnosis not present

## 2014-01-30 ENCOUNTER — Telehealth: Payer: Self-pay | Admitting: *Deleted

## 2014-01-30 NOTE — Telephone Encounter (Signed)
Discharge date: 01/29/14  Transition Care Management Follow-up Telephone Call  How have you been since you were released from the hospital? Per pt "Feeling better than last week"    Do you understand why you were in the hospital? YES   Do you understand the discharge instrcutions? YES  Items Reviewed:  Medications reviewed: YES  Allergies reviewed: NO  Dietary changes reviewed: N/A  Referrals reviewed: N/A    Functional Questionnaire:   Activities of Daily Living (ADLs):   She states they are independent in the following:  States they require assistance with the following:    Any transportation issues/concerns?: NO   Any patient concerns? NO   Confirmed importance and date/time of follow-up visits scheduled: YES, Sept 30th @ 9:00   Confirmed with patient if condition begins to worsen call PCP or go to the ER.  Patient was given the Call-a-Nurse line [phone]: YES

## 2014-02-02 ENCOUNTER — Ambulatory Visit: Payer: Self-pay | Admitting: Internal Medicine

## 2014-02-02 DIAGNOSIS — R928 Other abnormal and inconclusive findings on diagnostic imaging of breast: Secondary | ICD-10-CM | POA: Diagnosis not present

## 2014-02-02 DIAGNOSIS — Z1231 Encounter for screening mammogram for malignant neoplasm of breast: Secondary | ICD-10-CM | POA: Diagnosis not present

## 2014-02-03 ENCOUNTER — Telehealth: Payer: Self-pay | Admitting: Internal Medicine

## 2014-02-03 NOTE — Telephone Encounter (Signed)
Recent mammogram from 9/24 showed possible asymmetry in the left breast. They have recommended compression views. Has this been scheduled?

## 2014-02-07 ENCOUNTER — Ambulatory Visit: Payer: Medicare Other | Admitting: Internal Medicine

## 2014-02-07 ENCOUNTER — Other Ambulatory Visit: Payer: Self-pay | Admitting: *Deleted

## 2014-02-07 DIAGNOSIS — E119 Type 2 diabetes mellitus without complications: Secondary | ICD-10-CM

## 2014-02-07 MED ORDER — METFORMIN HCL 1000 MG PO TABS: 1000.0000 mg | ORAL_TABLET | Freq: Two times a day (BID) | ORAL | Status: DC

## 2014-02-07 NOTE — Telephone Encounter (Signed)
Pt is scheduled for Thursday

## 2014-02-08 ENCOUNTER — Ambulatory Visit (INDEPENDENT_AMBULATORY_CARE_PROVIDER_SITE_OTHER): Payer: Medicare Other | Admitting: Internal Medicine

## 2014-02-08 ENCOUNTER — Encounter: Payer: Self-pay | Admitting: Internal Medicine

## 2014-02-08 VITALS — BP 142/68 | HR 73 | Temp 98.3°F | Ht 62.0 in | Wt 235.0 lb

## 2014-02-08 DIAGNOSIS — Z23 Encounter for immunization: Secondary | ICD-10-CM

## 2014-02-08 DIAGNOSIS — E538 Deficiency of other specified B group vitamins: Secondary | ICD-10-CM

## 2014-02-08 DIAGNOSIS — R42 Dizziness and giddiness: Secondary | ICD-10-CM | POA: Diagnosis not present

## 2014-02-08 DIAGNOSIS — I10 Essential (primary) hypertension: Secondary | ICD-10-CM | POA: Diagnosis not present

## 2014-02-08 DIAGNOSIS — E119 Type 2 diabetes mellitus without complications: Secondary | ICD-10-CM | POA: Diagnosis not present

## 2014-02-08 LAB — COMPREHENSIVE METABOLIC PANEL
ALBUMIN: 4.1 g/dL (ref 3.5–5.2)
ALT: 15 U/L (ref 0–35)
AST: 17 U/L (ref 0–37)
Alkaline Phosphatase: 47 U/L (ref 39–117)
BUN: 19 mg/dL (ref 6–23)
CALCIUM: 9.7 mg/dL (ref 8.4–10.5)
CHLORIDE: 101 meq/L (ref 96–112)
CO2: 29 meq/L (ref 19–32)
Creatinine, Ser: 0.8 mg/dL (ref 0.4–1.2)
GFR: 76.85 mL/min (ref >60.00)
Glucose, Bld: 142 mg/dL — ABNORMAL HIGH (ref 70–99)
POTASSIUM: 3.8 meq/L (ref 3.5–5.1)
Sodium: 139 mEq/L (ref 135–145)
Total Bilirubin: 0.5 mg/dL (ref 0.2–1.2)
Total Protein: 8.1 g/dL (ref 6.0–8.3)

## 2014-02-08 LAB — MICROALBUMIN / CREATININE URINE RATIO
CREATININE, U: 337.1 mg/dL
MICROALB UR: 4.7 mg/dL — AB (ref 0.0–1.9)
MICROALB/CREAT RATIO: 1.4 mg/g (ref 0.0–30.0)

## 2014-02-08 LAB — LIPID PANEL
CHOL/HDL RATIO: 3
Cholesterol: 188 mg/dL (ref 0–200)
HDL: 54.5 mg/dL (ref >39.00)
LDL Cholesterol: 108 mg/dL — ABNORMAL HIGH (ref 0–99)
NonHDL: 133.5
Triglycerides: 129 mg/dL (ref 0.0–149.0)
VLDL: 25.8 mg/dL (ref 0.0–40.0)

## 2014-02-08 LAB — MAGNESIUM: Magnesium: 1.2 mg/dL — ABNORMAL LOW (ref 1.5–2.5)

## 2014-02-08 LAB — HEMOGLOBIN A1C: Hgb A1c MFr Bld: 7.8 % — ABNORMAL HIGH (ref 4.6–6.5)

## 2014-02-08 MED ORDER — CYANOCOBALAMIN 1000 MCG/ML IJ SOLN
1000.0000 ug | Freq: Once | INTRAMUSCULAR | Status: AC
  Administered 2014-02-08: 1000 ug via INTRAMUSCULAR

## 2014-02-08 MED ORDER — MAGNESIUM OXIDE 400 MG PO TABS: 400.0000 mg | ORAL_TABLET | Freq: Two times a day (BID) | ORAL | Status: DC

## 2014-02-08 NOTE — Assessment & Plan Note (Signed)
Will check A1c with labs today. Continue Metformin and Levemir.

## 2014-02-08 NOTE — Assessment & Plan Note (Signed)
Reviewed notes from admission at [facility]. CT head was normal. Symptoms of vertigo have resolved. Likely BPV. However, given frequent PACs will set up evaluation with cardiology.May need Holter to evaluate for AFIB if recurrent dizziness.

## 2014-02-08 NOTE — Patient Instructions (Signed)
Flu vaccine, Prevnar vaccine today.  Labs today.  Follow up in 3 months or sooner as needed.

## 2014-02-08 NOTE — Addendum Note (Signed)
Addended by: Ronette Deter A on: 02/08/2014 08:25 PM   Modules accepted: Orders

## 2014-02-08 NOTE — Progress Notes (Signed)
Subjective:    Patient ID: [name], female    DOB: 1945/09/13, 68 y.o.   MRN: [id number]  HPI 68YO female presents for hospital follow up. Admitted 9/18 Discharged 9/20 Diagnosis Vertigo  Neurological evaluation was normal. CT head was normal. She was observed then discharged home.  No recurrent episodes of vertigo since hospitalization. Has not used any Valium. Used Meclizine for 2-3 days after hospitalization. No other concerns today. Did not bring record of BG. Compliant with medications.   Review of Systems  Constitutional: Negative for fever, chills, appetite change, fatigue and unexpected weight change.  Eyes: Negative for visual disturbance.  Respiratory: Negative for shortness of breath.   Cardiovascular: Negative for chest pain and leg swelling.  Gastrointestinal: Negative for nausea, vomiting, abdominal pain, diarrhea and constipation.  Skin: Negative for color change and rash.  Neurological: Negative for dizziness (symptoms have resolved after recent admission), tremors, syncope, facial asymmetry, weakness, light-headedness, numbness and headaches.  Hematological: Negative for adenopathy. Does not bruise/bleed easily.  Psychiatric/Behavioral: Negative for dysphoric mood. The patient is not nervous/anxious.        Objective:    BP 142/68  Pulse 73  Temp(Src) 98.3 F (36.8 C) (Oral)  Ht 5\' 2"  (1.575 m)  Wt 235 lb (106.595 kg)  BMI 42.97 kg/m2  SpO2 97% Physical Exam  Constitutional: She is oriented to person, place, and time. She appears well-developed and well-nourished. No distress.  HENT:  Head: Normocephalic and atraumatic.  Right Ear: External ear normal.  Left Ear: External ear normal.  Nose: Nose normal.  Mouth/Throat: Oropharynx is clear and moist. No oropharyngeal exudate.  Eyes: Conjunctivae are normal. Pupils are equal, round, and reactive to light. Right eye exhibits no discharge. Left eye exhibits no discharge. No scleral icterus.    Neck: Normal range of motion. Neck supple. No tracheal deviation present. No thyromegaly present.  Cardiovascular: Normal rate, regular rhythm, normal heart sounds and intact distal pulses.   Extrasystoles are present. Exam reveals no gallop and no friction rub.   No murmur heard. Pulmonary/Chest: Effort normal and breath sounds normal. No accessory muscle usage. Not tachypneic. No respiratory distress. She has no decreased breath sounds. She has no wheezes. She has no rhonchi. She has no rales. She exhibits no tenderness.  Musculoskeletal: Normal range of motion. She exhibits no edema and no tenderness.  Lymphadenopathy:    She has no cervical adenopathy.  Neurological: She is alert and oriented to person, place, and time. No cranial nerve deficit. She exhibits normal muscle tone. Coordination normal.  Skin: Skin is warm and dry. No rash noted. She is not diaphoretic. No erythema. No pallor.  Psychiatric: She has a normal mood and affect. Her behavior is normal. Judgment and thought content normal.          Assessment & Plan:   Problem List Items Addressed This Visit     Unprioritized   Essential hypertension, benign      BP Readings from Last 3 Encounters:  02/08/14 142/68  11/04/13 152/80  10/18/13 144/60   BP just above goal, but hesitant to increase medication given recent vertigo and h/o hypotension with BP meds. Will set up follow up with cardiology given recent irregular HR and to recheck BP.     Relevant Orders      Magnesium   Type II or unspecified type diabetes mellitus without mention of complication, not stated as uncontrolled     Will check A1c with labs today. Continue Metformin and  Levemir.    Relevant Orders      Comprehensive metabolic panel      Hemoglobin A1c      Lipid panel      Microalbumin / creatinine urine ratio   Vertigo - Primary     Reviewed notes from admission at [facility]. CT head was normal. Symptoms of vertigo have resolved. Likely BPV. However,  given frequent PACs will set up evaluation with cardiology.May need Holter to evaluate for AFIB if recurrent dizziness.    Relevant Orders      EKG 12-Lead (Completed)       Return in about 3 months (around 05/10/2014) for Recheck of Diabetes.

## 2014-02-08 NOTE — Assessment & Plan Note (Signed)
BP Readings from Last 3 Encounters:  02/08/14 142/68  11/04/13 152/80  10/18/13 144/60   BP just above goal, but hesitant to increase medication given recent vertigo and h/o hypotension with BP meds. Will set up follow up with cardiology given recent irregular HR and to recheck BP.

## 2014-02-08 NOTE — Progress Notes (Signed)
Pre visit review using our clinic review tool, if applicable. No additional management support is needed unless otherwise documented below in the visit note. 

## 2014-02-09 ENCOUNTER — Ambulatory Visit: Payer: Self-pay | Admitting: Internal Medicine

## 2014-02-09 DIAGNOSIS — R928 Other abnormal and inconclusive findings on diagnostic imaging of breast: Secondary | ICD-10-CM | POA: Diagnosis not present

## 2014-02-09 DIAGNOSIS — N6489 Other specified disorders of breast: Secondary | ICD-10-CM | POA: Diagnosis not present

## 2014-03-09 ENCOUNTER — Encounter: Payer: Self-pay | Admitting: Internal Medicine

## 2014-03-09 ENCOUNTER — Telehealth: Payer: Self-pay | Admitting: Internal Medicine

## 2014-03-09 NOTE — Telephone Encounter (Signed)
Pt called to schedule appt for numbness in wrist. Please advise where to add pt to the schedule/msn

## 2014-03-09 NOTE — Telephone Encounter (Signed)
Next available 30min 

## 2014-03-13 ENCOUNTER — Telehealth: Payer: Self-pay | Admitting: Internal Medicine

## 2014-03-13 ENCOUNTER — Ambulatory Visit (INDEPENDENT_AMBULATORY_CARE_PROVIDER_SITE_OTHER): Payer: Medicare Other | Admitting: Internal Medicine

## 2014-03-13 ENCOUNTER — Encounter: Payer: Self-pay | Admitting: Internal Medicine

## 2014-03-13 VITALS — BP 162/70 | HR 78 | Temp 99.0°F | Ht 62.0 in | Wt 237.5 lb

## 2014-03-13 DIAGNOSIS — M25531 Pain in right wrist: Secondary | ICD-10-CM | POA: Diagnosis not present

## 2014-03-13 DIAGNOSIS — M25539 Pain in unspecified wrist: Secondary | ICD-10-CM | POA: Insufficient documentation

## 2014-03-13 DIAGNOSIS — E538 Deficiency of other specified B group vitamins: Secondary | ICD-10-CM

## 2014-03-13 LAB — MAGNESIUM: MAGNESIUM: 1.3 mg/dL — AB (ref 1.5–2.5)

## 2014-03-13 MED ORDER — CYANOCOBALAMIN 1000 MCG/ML IJ SOLN
1000.0000 ug | Freq: Once | INTRAMUSCULAR | Status: AC
  Administered 2014-03-13: 1000 ug via INTRAMUSCULAR

## 2014-03-13 NOTE — Assessment & Plan Note (Signed)
Right wrist pain and decreased grip strength. Pt is s/p carpal tunnel repair several years ago. Will set up evaluation with hand surgery. Question synovial cyst in medial wrist.

## 2014-03-13 NOTE — Telephone Encounter (Signed)
[name] said Dr. Gilford Rile wants to see her in 30wks. I didn't see an appt available within that time slot. She's currently scheduled on 04/25/14. Please call the patient if she can come in sooner. Pt ph# [phone] Thank you.

## 2014-03-13 NOTE — Progress Notes (Signed)
Pre visit review using our clinic review tool, if applicable. No additional management support is needed unless otherwise documented below in the visit note. 

## 2014-03-13 NOTE — Progress Notes (Signed)
   Subjective:    Patient ID: [name], female    DOB: 04/10/1946, 68 y.o.   MRN: [id number]  HPI 68YO female presents for acute visit.  Right wrist numbness - Ongoing for several weeks. No trauma to hand. Notes swelling in palmar wrist. Had previous carpal tunnel repair several years ago. Notes progressive weakness in grip strength and numbness extending up arm  Review of Systems  Constitutional: Negative for fever, chills, appetite change, fatigue and unexpected weight change.  Eyes: Negative for visual disturbance.  Respiratory: Negative for shortness of breath.   Cardiovascular: Negative for chest pain.  Gastrointestinal: Negative for abdominal pain.  Musculoskeletal: Positive for myalgias, joint swelling and arthralgias.  Skin: Negative for color change and rash.  Neurological: Positive for weakness and numbness.  Hematological: Negative for adenopathy. Does not bruise/bleed easily.  Psychiatric/Behavioral: Negative for dysphoric mood. The patient is not nervous/anxious.        Objective:    BP 162/70 mmHg  Pulse 78  Temp(Src) 99 F (37.2 C) (Oral)  Ht 5\' 2"  (1.575 m)  Wt 237 lb 8 oz (107.729 kg)  BMI 43.43 kg/m2  SpO2 96% Physical Exam  Constitutional: She is oriented to person, place, and time. She appears well-developed and well-nourished. No distress.  HENT:  Head: Normocephalic and atraumatic.  Right Ear: External ear normal.  Left Ear: External ear normal.  Nose: Nose normal.  Mouth/Throat: Oropharynx is clear and moist.  Eyes: Conjunctivae are normal. Pupils are equal, round, and reactive to light. Right eye exhibits no discharge. Left eye exhibits no discharge. No scleral icterus.  Neck: Normal range of motion. Neck supple. No tracheal deviation present. No thyromegaly present.  Pulmonary/Chest: Effort normal. No accessory muscle usage. No tachypnea. She has no decreased breath sounds. She has no rhonchi.  Musculoskeletal: Normal range of motion. She  exhibits no edema.       Right wrist: She exhibits tenderness (medial palmar surface) and swelling (approx 1cm diameter palpable swelling, most consistent with synovial cyst right medial wrist). She exhibits normal range of motion.       Right hand: Normal sensation noted. Decreased strength noted. She exhibits thumb/finger opposition.  Lymphadenopathy:    She has no cervical adenopathy.  Neurological: She is alert and oriented to person, place, and time. No cranial nerve deficit. She exhibits normal muscle tone. Coordination normal.  Skin: Skin is warm and dry. No rash noted. She is not diaphoretic. No erythema. No pallor.  Psychiatric: She has a normal mood and affect. Her behavior is normal. Judgment and thought content normal.          Assessment & Plan:   Problem List Items Addressed This Visit      Unprioritized   Hypomagnesemia   Relevant Orders      Magnesium   Wrist pain, acute - Primary    Right wrist pain and decreased grip strength. Pt is s/p carpal tunnel repair several years ago. Will set up evaluation with hand surgery. Question synovial cyst in medial wrist.    Relevant Orders      Ambulatory referral to Hand Surgery    Other Visit Diagnoses    B12 deficiency        Relevant Medications       cyanocobalamin ((VITAMIN B-12)) injection 1,000 mcg (Completed)        Return in about 4 weeks (around 04/10/2014).

## 2014-03-13 NOTE — Patient Instructions (Signed)
We will set up referral to hand surgery.  Follow up in 4 weeks and as needed.

## 2014-03-23 ENCOUNTER — Encounter: Payer: Self-pay | Admitting: Internal Medicine

## 2014-03-30 ENCOUNTER — Other Ambulatory Visit: Payer: Self-pay | Admitting: Internal Medicine

## 2014-03-30 ENCOUNTER — Other Ambulatory Visit: Payer: Self-pay | Admitting: *Deleted

## 2014-03-30 MED ORDER — GLUCOSE BLOOD VI STRP: ORAL_STRIP | Status: DC

## 2014-03-30 NOTE — Telephone Encounter (Signed)
Fax from pharmacy, needing specific name of test strips, directions and Dx code. Sent mychart for name of glucometer

## 2014-03-31 MED ORDER — GLUCOSE BLOOD VI STRP: ORAL_STRIP | Status: DC

## 2014-04-10 DIAGNOSIS — M778 Other enthesopathies, not elsewhere classified: Secondary | ICD-10-CM | POA: Diagnosis not present

## 2014-04-13 ENCOUNTER — Encounter: Payer: Self-pay | Admitting: Internal Medicine

## 2014-04-13 ENCOUNTER — Ambulatory Visit (INDEPENDENT_AMBULATORY_CARE_PROVIDER_SITE_OTHER): Payer: Medicare Other | Admitting: Internal Medicine

## 2014-04-13 VITALS — BP 172/98 | HR 51 | Temp 98.4°F | Ht 62.0 in | Wt 233.5 lb

## 2014-04-13 DIAGNOSIS — E1065 Type 1 diabetes mellitus with hyperglycemia: Secondary | ICD-10-CM

## 2014-04-13 DIAGNOSIS — M25531 Pain in right wrist: Secondary | ICD-10-CM

## 2014-04-13 DIAGNOSIS — I10 Essential (primary) hypertension: Secondary | ICD-10-CM | POA: Diagnosis not present

## 2014-04-13 DIAGNOSIS — E538 Deficiency of other specified B group vitamins: Secondary | ICD-10-CM

## 2014-04-13 DIAGNOSIS — E1069 Type 1 diabetes mellitus with other specified complication: Secondary | ICD-10-CM

## 2014-04-13 DIAGNOSIS — E108 Type 1 diabetes mellitus with unspecified complications: Secondary | ICD-10-CM

## 2014-04-13 DIAGNOSIS — IMO0002 Reserved for concepts with insufficient information to code with codable children: Secondary | ICD-10-CM

## 2014-04-13 MED ORDER — AMLODIPINE BESYLATE 5 MG PO TABS: 5.0000 mg | ORAL_TABLET | Freq: Every day | ORAL | Status: DC

## 2014-04-13 MED ORDER — CYANOCOBALAMIN 1000 MCG/ML IJ SOLN
1000.0000 ug | Freq: Once | INTRAMUSCULAR | Status: AC
  Administered 2014-04-13: 1000 ug via INTRAMUSCULAR

## 2014-04-13 NOTE — Assessment & Plan Note (Signed)
Reviewed notes from Dr. Malvin Johns at [facility]. Symptoms improved after steroid injection. We discussed potential elevation of BG and BP with use of Kenalog. Plan repeat A1c in 1 month.

## 2014-04-13 NOTE — Patient Instructions (Signed)
Start Amlodipine 5mg  daily.  Monitor blood pressure at home and email with readings.  Follow up in 4 weeks.

## 2014-04-13 NOTE — Progress Notes (Signed)
Subjective:    Patient ID: [name], female    DOB: 01-21-1946, 68 y.o.   MRN: [id number]  HPI 68YO female presents for follow up.  Right wrist pain - Saw ortho, Dr. Malvin Johns, and diagnosed with tendonitis. Treated with Kenalog injection. Pain has improved. Follow up scheduled   HTN -  BP Readings from Last 3 Encounters:  04/13/14 172/98  03/13/14 162/70  02/08/14 142/68  BP elevated recently. In the past, BP was lower and she was unable to tolerate ARB because of hypotension. No chest pain, headache, palpitations.  Review of Systems  Constitutional: Negative for fever, chills, appetite change, fatigue and unexpected weight change.  Eyes: Negative for visual disturbance.  Respiratory: Negative for shortness of breath.   Cardiovascular: Negative for chest pain, palpitations and leg swelling.  Gastrointestinal: Negative for abdominal pain.  Musculoskeletal: Positive for arthralgias.  Skin: Negative for color change and rash.  Neurological: Negative for headaches.  Hematological: Negative for adenopathy. Does not bruise/bleed easily.  Psychiatric/Behavioral: Negative for dysphoric mood. The patient is not nervous/anxious.        Objective:    BP 172/98 mmHg  Pulse 51  Temp(Src) 98.4 F (36.9 C) (Oral)  Ht 5\' 2"  (1.575 m)  Wt 233 lb 8 oz (105.915 kg)  BMI 42.70 kg/m2  SpO2 97% Physical Exam  Constitutional: She is oriented to person, place, and time. She appears well-developed and well-nourished. No distress.  HENT:  Head: Normocephalic and atraumatic.  Right Ear: External ear normal.  Left Ear: External ear normal.  Nose: Nose normal.  Mouth/Throat: Oropharynx is clear and moist. No oropharyngeal exudate.  Eyes: Conjunctivae are normal. Pupils are equal, round, and reactive to light. Right eye exhibits no discharge. Left eye exhibits no discharge. No scleral icterus.  Neck: Normal range of motion. Neck supple. No tracheal deviation present. No thyromegaly  present.  Cardiovascular: Normal rate, regular rhythm, normal heart sounds and intact distal pulses.  Exam reveals no gallop and no friction rub.   No murmur heard. Pulmonary/Chest: Effort normal and breath sounds normal. No accessory muscle usage. No tachypnea. No respiratory distress. She has no decreased breath sounds. She has no wheezes. She has no rhonchi. She has no rales. She exhibits no tenderness.  Musculoskeletal: Normal range of motion. She exhibits no edema or tenderness.  Lymphadenopathy:    She has no cervical adenopathy.  Neurological: She is alert and oriented to person, place, and time. No cranial nerve deficit. She exhibits normal muscle tone. Coordination normal.  Skin: Skin is warm and dry. No rash noted. She is not diaphoretic. No erythema. No pallor.  Psychiatric: She has a normal mood and affect. Her behavior is normal. Judgment and thought content normal.          Assessment & Plan:   Problem List Items Addressed This Visit      Unprioritized   Essential hypertension, benign    BP Readings from Last 3 Encounters:  04/13/14 172/98  03/13/14 162/70  02/08/14 142/68   BP elevated. Unable to tolerate ARB in past because of nausea. Will add Amlodipine 5mg  daily. She will monitor BP at home and email with readings. Follow up in 4 weeks and prn.    Relevant Medications      amLODIpine (NORVASC) tablet   Type I diabetes mellitus with complication, uncontrolled   Relevant Orders      Comprehensive metabolic panel      Hemoglobin A1c      Lipid panel  Microalbumin / creatinine urine ratio      Magnesium   Wrist pain, acute - Primary    Reviewed notes from Dr. Malvin Johns at [facility]. Symptoms improved after steroid injection. We discussed potential elevation of BG and BP with use of Kenalog. Plan repeat A1c in 1 month.     Other Visit Diagnoses    B12 deficiency        Relevant Medications       cyanocobalamin ((VITAMIN B-12)) injection 1,000 mcg (Completed)         Return in about 4 weeks (around 05/11/2014) for Recheck of Diabetes.

## 2014-04-13 NOTE — Assessment & Plan Note (Signed)
BP Readings from Last 3 Encounters:  04/13/14 172/98  03/13/14 162/70  02/08/14 142/68   BP elevated. Unable to tolerate ARB in past because of nausea. Will add Amlodipine 5mg  daily. She will monitor BP at home and email with readings. Follow up in 4 weeks and prn.

## 2014-04-13 NOTE — Progress Notes (Signed)
Pre visit review using our clinic review tool, if applicable. No additional management support is needed unless otherwise documented below in the visit note. 

## 2014-04-19 ENCOUNTER — Other Ambulatory Visit: Payer: Self-pay | Admitting: *Deleted

## 2014-04-19 DIAGNOSIS — E1065 Type 1 diabetes mellitus with hyperglycemia: Secondary | ICD-10-CM

## 2014-04-19 DIAGNOSIS — IMO0002 Reserved for concepts with insufficient information to code with codable children: Secondary | ICD-10-CM

## 2014-04-19 DIAGNOSIS — E108 Type 1 diabetes mellitus with unspecified complications: Principal | ICD-10-CM

## 2014-04-19 MED ORDER — INSULIN DETEMIR 100 UNIT/ML FLEXPEN: 60.0000 [IU] | PEN_INJECTOR | Freq: Two times a day (BID) | SUBCUTANEOUS | Status: DC

## 2014-04-19 MED ORDER — GLUCOSE BLOOD VI STRP: ORAL_STRIP | Status: DC

## 2014-04-19 MED ORDER — METFORMIN HCL 1000 MG PO TABS: 1000.0000 mg | ORAL_TABLET | Freq: Two times a day (BID) | ORAL | Status: DC

## 2014-04-25 ENCOUNTER — Ambulatory Visit: Payer: Medicare Other | Admitting: Internal Medicine

## 2014-05-02 ENCOUNTER — Encounter: Payer: Self-pay | Admitting: Internal Medicine

## 2014-05-09 ENCOUNTER — Ambulatory Visit (INDEPENDENT_AMBULATORY_CARE_PROVIDER_SITE_OTHER): Payer: Medicare Other | Admitting: Internal Medicine

## 2014-05-09 ENCOUNTER — Encounter: Payer: Self-pay | Admitting: *Deleted

## 2014-05-09 ENCOUNTER — Encounter: Payer: Self-pay | Admitting: Internal Medicine

## 2014-05-09 VITALS — BP 121/79 | HR 83 | Temp 98.2°F | Ht 62.0 in

## 2014-05-09 DIAGNOSIS — E785 Hyperlipidemia, unspecified: Secondary | ICD-10-CM | POA: Diagnosis not present

## 2014-05-09 DIAGNOSIS — E1065 Type 1 diabetes mellitus with hyperglycemia: Secondary | ICD-10-CM

## 2014-05-09 DIAGNOSIS — I10 Essential (primary) hypertension: Secondary | ICD-10-CM

## 2014-05-09 DIAGNOSIS — IMO0002 Reserved for concepts with insufficient information to code with codable children: Secondary | ICD-10-CM

## 2014-05-09 DIAGNOSIS — E1069 Type 1 diabetes mellitus with other specified complication: Secondary | ICD-10-CM

## 2014-05-09 DIAGNOSIS — E538 Deficiency of other specified B group vitamins: Secondary | ICD-10-CM

## 2014-05-09 DIAGNOSIS — E108 Type 1 diabetes mellitus with unspecified complications: Principal | ICD-10-CM

## 2014-05-09 LAB — HM DIABETES EYE EXAM

## 2014-05-09 MED ORDER — CYANOCOBALAMIN 1000 MCG/ML IJ SOLN
1000.0000 ug | Freq: Once | INTRAMUSCULAR | Status: AC
  Administered 2014-05-09: 1000 ug via INTRAMUSCULAR

## 2014-05-09 NOTE — Assessment & Plan Note (Signed)
BP Readings from Last 3 Encounters:  05/09/14 121/79  04/13/14 172/98  03/13/14 162/70   BP well controlled on current medication. Intolerant of ACE and ARB. Renal function with labs today.

## 2014-05-09 NOTE — Progress Notes (Signed)
Subjective:    Patient ID: [name], female    DOB: 1945/08/27, 68 y.o.   MRN: [id number]  HPI 68YO female presents for follow up.  DM - BG have been relatively well controlled. Trying to work on healthy diet and exercise with goal of weight loss.  Wt Readings from Last 3 Encounters:  04/13/14 233 lb 8 oz (105.915 kg)  03/13/14 237 lb 8 oz (107.729 kg)  02/08/14 235 lb (106.595 kg)   Continues to care for husband who has poorly controlled DM and severe neuropathy. Also caring for grandchildren. Has occasional episodes of vertigo, which have been chronic for her. Taking Meclizine with some improvement.   Past medical, surgical, family and social history per today's encounter.  Review of Systems  Constitutional: Negative for fever, chills, appetite change, fatigue and unexpected weight change.  Eyes: Negative for visual disturbance.  Respiratory: Negative for shortness of breath.   Cardiovascular: Negative for chest pain and leg swelling.  Gastrointestinal: Negative for nausea, vomiting, abdominal pain, diarrhea and constipation.  Musculoskeletal: Positive for arthralgias. Negative for myalgias.  Skin: Negative for color change and rash.  Hematological: Negative for adenopathy. Does not bruise/bleed easily.  Psychiatric/Behavioral: Negative for sleep disturbance and dysphoric mood. The patient is not nervous/anxious.        Objective:    BP 121/79 mmHg  Pulse 83  Temp(Src) 98.2 F (36.8 C) (Oral)  Ht 5\' 2"  (1.575 m)  SpO2 98% Physical Exam  Constitutional: She is oriented to person, place, and time. She appears well-developed and well-nourished. No distress.  HENT:  Head: Normocephalic and atraumatic.  Right Ear: External ear normal.  Left Ear: External ear normal.  Nose: Nose normal.  Mouth/Throat: Oropharynx is clear and moist. No oropharyngeal exudate.  Eyes: Conjunctivae are normal. Pupils are equal, round, and reactive to light. Right eye exhibits no  discharge. Left eye exhibits no discharge. No scleral icterus.  Neck: Normal range of motion. Neck supple. No tracheal deviation present. No thyromegaly present.  Cardiovascular: Normal rate, regular rhythm, normal heart sounds and intact distal pulses.  Exam reveals no gallop and no friction rub.   No murmur heard. Pulmonary/Chest: Effort normal and breath sounds normal. No accessory muscle usage. No tachypnea. No respiratory distress. She has no decreased breath sounds. She has no wheezes. She has no rhonchi. She has no rales. She exhibits no tenderness.  Musculoskeletal: Normal range of motion. She exhibits no edema or tenderness.       Right wrist: She exhibits deformity (thickening of joint at wrist). She exhibits normal range of motion and no tenderness.  Lymphadenopathy:    She has no cervical adenopathy.  Neurological: She is alert and oriented to person, place, and time. No cranial nerve deficit. She exhibits normal muscle tone. Coordination normal.  Skin: Skin is warm and dry. No rash noted. She is not diaphoretic. No erythema. No pallor.  Psychiatric: She has a normal mood and affect. Her behavior is normal. Judgment and thought content normal.          Assessment & Plan:   Problem List Items Addressed This Visit      Unprioritized   Essential hypertension, benign    BP Readings from Last 3 Encounters:  05/09/14 121/79  04/13/14 172/98  03/13/14 162/70   BP well controlled on current medication. Intolerant of ACE and ARB. Renal function with labs today.    Hyperlipidemia    Will check lipids and LFTs with labs. Continue Crestor.  Hypomagnesemia   Morbid obesity    Wt Readings from Last 3 Encounters:  04/13/14 233 lb 8 oz (105.915 kg)  03/13/14 237 lb 8 oz (107.729 kg)  02/08/14 235 lb (106.595 kg)   There is no weight on file to calculate BMI. The patient is asked to make an attempt to improve diet and exercise patterns to aid in medical management of this  problem.     Type I diabetes mellitus with complication, uncontrolled - Primary    Will check A1c with labs. Continue Levemir, Metformin.     Other Visit Diagnoses    B12 deficiency        Relevant Medications       cyanocobalamin ((VITAMIN B-12)) injection 1,000 mcg (Completed)        Return in about 3 months (around 08/08/2014) for Wellness Visit.

## 2014-05-09 NOTE — Assessment & Plan Note (Signed)
Will check A1c with labs. Continue Levemir, Metformin.

## 2014-05-09 NOTE — Assessment & Plan Note (Signed)
Will check lipids and LFTs with labs. Continue Crestor.

## 2014-05-09 NOTE — Assessment & Plan Note (Signed)
Wt Readings from Last 3 Encounters:  04/13/14 233 lb 8 oz (105.915 kg)  03/13/14 237 lb 8 oz (107.729 kg)  02/08/14 235 lb (106.595 kg)   There is no weight on file to calculate BMI. The patient is asked to make an attempt to improve diet and exercise patterns to aid in medical management of this problem.

## 2014-05-09 NOTE — Patient Instructions (Signed)
Labs today.   Follow up in 3 months.  

## 2014-05-10 LAB — COMPREHENSIVE METABOLIC PANEL
ALT: 16 U/L (ref 0–35)
AST: 19 U/L (ref 0–37)
Albumin: 4.1 g/dL (ref 3.5–5.2)
Alkaline Phosphatase: 52 U/L (ref 39–117)
BILIRUBIN TOTAL: 0.5 mg/dL (ref 0.2–1.2)
BUN: 14 mg/dL (ref 6–23)
CALCIUM: 9.8 mg/dL (ref 8.4–10.5)
CHLORIDE: 102 meq/L (ref 96–112)
CO2: 26 meq/L (ref 19–32)
CREATININE: 0.9 mg/dL (ref 0.4–1.2)
GFR: 69.63 mL/min (ref >60.00)
Glucose, Bld: 113 mg/dL — ABNORMAL HIGH (ref 70–99)
Potassium: 4.2 mEq/L (ref 3.5–5.1)
SODIUM: 140 meq/L (ref 135–145)
TOTAL PROTEIN: 7.8 g/dL (ref 6.0–8.3)

## 2014-05-10 LAB — LIPID PANEL
CHOLESTEROL: 242 mg/dL — AB (ref 0–200)
HDL: 62.6 mg/dL (ref >39.00)
LDL CALC: 153 mg/dL — AB (ref 0–99)
NonHDL: 179.4
Total CHOL/HDL Ratio: 4
Triglycerides: 133 mg/dL (ref 0.0–149.0)
VLDL: 26.6 mg/dL (ref 0.0–40.0)

## 2014-05-10 LAB — MAGNESIUM: Magnesium: 1.4 mg/dL — ABNORMAL LOW (ref 1.5–2.5)

## 2014-05-10 LAB — MICROALBUMIN / CREATININE URINE RATIO
Creatinine,U: 267.3 mg/dL
MICROALB UR: 8.2 mg/dL — AB (ref 0.0–1.9)
Microalb Creat Ratio: 3.1 mg/g (ref 0.0–30.0)

## 2014-05-10 LAB — HEMOGLOBIN A1C: Hgb A1c MFr Bld: 8 % — ABNORMAL HIGH (ref 4.6–6.5)

## 2014-05-19 ENCOUNTER — Ambulatory Visit (INDEPENDENT_AMBULATORY_CARE_PROVIDER_SITE_OTHER): Payer: Medicare Other | Admitting: Internal Medicine

## 2014-05-19 ENCOUNTER — Encounter: Payer: Self-pay | Admitting: Internal Medicine

## 2014-05-19 VITALS — BP 159/79 | HR 73 | Temp 98.4°F | Ht 62.0 in | Wt 233.0 lb

## 2014-05-19 DIAGNOSIS — B9789 Other viral agents as the cause of diseases classified elsewhere: Principal | ICD-10-CM

## 2014-05-19 DIAGNOSIS — J069 Acute upper respiratory infection, unspecified: Secondary | ICD-10-CM

## 2014-05-19 MED ORDER — HYDROCOD POLST-CHLORPHEN POLST 10-8 MG/5ML PO LQCR: 5.0000 mL | Freq: Two times a day (BID) | ORAL | Status: DC | PRN

## 2014-05-19 MED ORDER — BENZONATATE 200 MG PO CAPS: 200.0000 mg | ORAL_CAPSULE | Freq: Two times a day (BID) | ORAL | Status: DC | PRN

## 2014-05-19 NOTE — Patient Instructions (Signed)
Rest this weekend. Get adequate fluids. Start Tessalon 200mg  up to twice daily during the day for cough. Start Tussionex at night as needed for cough. Follow up immediately if chest pain, shortness of breath, worsening cough.  Upper Respiratory Infection, Adult An upper respiratory infection (URI) is also sometimes known as the common cold. The upper respiratory tract includes the nose, sinuses, throat, trachea, and bronchi. Bronchi are the airways leading to the lungs. Most people improve within 1 week, but symptoms can last up to 2 weeks. A residual cough may last even longer.  CAUSES Many different viruses can infect the tissues lining the upper respiratory tract. The tissues become irritated and inflamed and often become very moist. Mucus production is also common. A cold is contagious. You can easily spread the virus to others by oral contact. This includes kissing, sharing a glass, coughing, or sneezing. Touching your mouth or nose and then touching a surface, which is then touched by another person, can also spread the virus. SYMPTOMS  Symptoms typically develop 1 to 3 days after you come in contact with a cold virus. Symptoms vary from person to person. They may include:  Runny nose.  Sneezing.  Nasal congestion.  Sinus irritation.  Sore throat.  Loss of voice (laryngitis).  Cough.  Fatigue.  Muscle aches.  Loss of appetite.  Headache.  Low-grade fever. DIAGNOSIS  You might diagnose your own cold based on familiar symptoms, since most people get a cold 2 to 3 times a year. Your caregiver can confirm this based on your exam. Most importantly, your caregiver can check that your symptoms are not due to another disease such as strep throat, sinusitis, pneumonia, asthma, or epiglottitis. Blood tests, throat tests, and X-rays are not necessary to diagnose a common cold, but they may sometimes be helpful in excluding other more serious diseases. Your caregiver will decide if any  further tests are required. RISKS AND COMPLICATIONS  You may be at risk for a more severe case of the common cold if you smoke cigarettes, have chronic heart disease (such as heart failure) or lung disease (such as asthma), or if you have a weakened immune system. The very young and very old are also at risk for more serious infections. Bacterial sinusitis, middle ear infections, and bacterial pneumonia can complicate the common cold. The common cold can worsen asthma and chronic obstructive pulmonary disease (COPD). Sometimes, these complications can require emergency medical care and may be life-threatening. PREVENTION  The best way to protect against getting a cold is to practice good hygiene. Avoid oral or hand contact with people with cold symptoms. Wash your hands often if contact occurs. There is no clear evidence that vitamin C, vitamin E, echinacea, or exercise reduces the chance of developing a cold. However, it is always recommended to get plenty of rest and practice good nutrition. TREATMENT  Treatment is directed at relieving symptoms. There is no cure. Antibiotics are not effective, because the infection is caused by a virus, not by bacteria. Treatment may include:  Increased fluid intake. Sports drinks offer valuable electrolytes, sugars, and fluids.  Breathing heated mist or steam (vaporizer or shower).  Eating chicken soup or other clear broths, and maintaining good nutrition.  Getting plenty of rest.  Using gargles or lozenges for comfort.  Controlling fevers with ibuprofen or acetaminophen as directed by your caregiver.  Increasing usage of your inhaler if you have asthma. Zinc gel and zinc lozenges, taken in the first 24 hours of the  common cold, can shorten the duration and lessen the severity of symptoms. Pain medicines may help with fever, muscle aches, and throat pain. A variety of non-prescription medicines are available to treat congestion and runny nose. Your caregiver  can make recommendations and may suggest nasal or lung inhalers for other symptoms.  HOME CARE INSTRUCTIONS   Only take over-the-counter or prescription medicines for pain, discomfort, or fever as directed by your caregiver.  Use a warm mist humidifier or inhale steam from a shower to increase air moisture. This may keep secretions moist and make it easier to breathe.  Drink enough water and fluids to keep your urine clear or pale yellow.  Rest as needed.  Return to work when your temperature has returned to normal or as your caregiver advises. You may need to stay home longer to avoid infecting others. You can also use a face mask and careful hand washing to prevent spread of the virus. SEEK MEDICAL CARE IF:   After the first few days, you feel you are getting worse rather than better.  You need your caregiver's advice about medicines to control symptoms.  You develop chills, worsening shortness of breath, or brown or red sputum. These may be signs of pneumonia.  You develop yellow or brown nasal discharge or pain in the face, especially when you bend forward. These may be signs of sinusitis.  You develop a fever, swollen neck glands, pain with swallowing, or white areas in the back of your throat. These may be signs of strep throat. SEEK IMMEDIATE MEDICAL CARE IF:   You have a fever.  You develop severe or persistent headache, ear pain, sinus pain, or chest pain.  You develop wheezing, a prolonged cough, cough up blood, or have a change in your usual mucus (if you have chronic lung disease).  You develop sore muscles or a stiff neck. Document Released: 10/22/2000 Document Revised: 07/21/2011 Document Reviewed: 08/03/2013 [employer] [facility] Patient Information 2015 [place], [place]. This information is not intended to replace advice given to you by your health care provider. Make sure you discuss any questions you have with your health care provider.

## 2014-05-19 NOTE — Progress Notes (Signed)
Pre visit review using our clinic review tool, if applicable. No additional management support is needed unless otherwise documented below in the visit note. 

## 2014-05-19 NOTE — Progress Notes (Signed)
   Subjective:    Patient ID: [name], female    DOB: 1945/11/24, 69 y.o.   MRN: [id number]  HPI 69YO female presents for acute visit.  Developed sore throat, congestion and cough yesterday. Yolanda Bonine has been sick with similar symptoms. No fever or chills. No dyspnea. No chest pain. Taking tylenol for sore throat. Taking Rotibussin DM.    Past medical, surgical, family and social history per today's encounter.  Review of Systems  Constitutional: Positive for fatigue. Negative for fever, chills and unexpected weight change.  HENT: Positive for congestion, postnasal drip, rhinorrhea and sore throat. Negative for ear discharge, ear pain, facial swelling, hearing loss, mouth sores, nosebleeds, sinus pressure, sneezing, tinnitus, trouble swallowing and voice change.   Eyes: Negative for pain, discharge, redness and visual disturbance.  Respiratory: Positive for cough. Negative for chest tightness, shortness of breath, wheezing and stridor.   Cardiovascular: Negative for chest pain, palpitations and leg swelling.  Musculoskeletal: Negative for myalgias, arthralgias, neck pain and neck stiffness.  Skin: Negative for color change and rash.  Neurological: Negative for dizziness, weakness, light-headedness and headaches.  Hematological: Negative for adenopathy.       Objective:    BP 159/79 mmHg  Pulse 73  Temp(Src) 98.4 F (36.9 C) (Oral)  Ht 5\' 2"  (1.575 m)  Wt 233 lb (105.688 kg)  BMI 42.61 kg/m2  SpO2 98% Physical Exam  Constitutional: She is oriented to person, place, and time. She appears well-developed and well-nourished. No distress.  HENT:  Head: Normocephalic and atraumatic.  Right Ear: External ear normal.  Left Ear: External ear normal.  Nose: Mucosal edema and rhinorrhea present.  Mouth/Throat: Posterior oropharyngeal erythema (mild) present. No oropharyngeal exudate.  Eyes: Conjunctivae are normal. Pupils are equal, round, and reactive to light. Right eye  exhibits no discharge. Left eye exhibits no discharge. No scleral icterus.  Neck: Normal range of motion. Neck supple. No tracheal deviation present. No thyromegaly present.  Cardiovascular: Normal rate, regular rhythm, normal heart sounds and intact distal pulses.  Exam reveals no gallop and no friction rub.   No murmur heard. Pulmonary/Chest: Effort normal and breath sounds normal. No accessory muscle usage. No tachypnea. No respiratory distress. She has no decreased breath sounds. She has no wheezes. She has no rhonchi. She has no rales. She exhibits no tenderness.  Musculoskeletal: Normal range of motion. She exhibits no edema or tenderness.  Lymphadenopathy:    She has no cervical adenopathy.  Neurological: She is alert and oriented to person, place, and time. No cranial nerve deficit. She exhibits normal muscle tone. Coordination normal.  Skin: Skin is warm and dry. No rash noted. She is not diaphoretic. No erythema. No pallor.  Psychiatric: She has a normal mood and affect. Her behavior is normal. Judgment and thought content normal.          Assessment & Plan:   Problem List Items Addressed This Visit      Unprioritized   Viral URI with cough - Primary    Symptoms most consistent with viral URI with cough. Encouraged rest, adequate fluids. Tylenol prn pain or fever. Start Tessalon during the day and Tussionex at night for cough. Follow up if symptoms worsening or if any new concerns.    Relevant Medications      benzonatate (TESSALON) capsule      chlorpheniramine-HYDROcodone (TUSSIONEX) suspension 8-10 mg/20mL       Return if symptoms worsen or fail to improve.

## 2014-05-19 NOTE — Assessment & Plan Note (Signed)
Symptoms most consistent with viral URI with cough. Encouraged rest, adequate fluids. Tylenol prn pain or fever. Start Tessalon during the day and Tussionex at night for cough. Follow up if symptoms worsening or if any new concerns.

## 2014-06-07 DIAGNOSIS — H40053 Ocular hypertension, bilateral: Secondary | ICD-10-CM | POA: Diagnosis not present

## 2014-06-07 DIAGNOSIS — E108 Type 1 diabetes mellitus with unspecified complications: Secondary | ICD-10-CM | POA: Diagnosis not present

## 2014-06-07 DIAGNOSIS — E1065 Type 1 diabetes mellitus with hyperglycemia: Secondary | ICD-10-CM | POA: Diagnosis not present

## 2014-06-07 DIAGNOSIS — H2513 Age-related nuclear cataract, bilateral: Secondary | ICD-10-CM | POA: Diagnosis not present

## 2014-07-03 ENCOUNTER — Encounter: Payer: Self-pay | Admitting: Internal Medicine

## 2014-07-03 ENCOUNTER — Ambulatory Visit (INDEPENDENT_AMBULATORY_CARE_PROVIDER_SITE_OTHER): Payer: Medicare Other | Admitting: Internal Medicine

## 2014-07-03 ENCOUNTER — Ambulatory Visit (INDEPENDENT_AMBULATORY_CARE_PROVIDER_SITE_OTHER)
Admission: RE | Admit: 2014-07-03 | Discharge: 2014-07-03 | Disposition: A | Discharge status: Home / Self Care | Payer: Medicare Other | Source: Ambulatory Visit | Attending: Internal Medicine | Admitting: Internal Medicine

## 2014-07-03 VITALS — BP 168/98 | HR 70 | Temp 98.5°F | Resp 14 | Wt 239.2 lb

## 2014-07-03 DIAGNOSIS — M25511 Pain in right shoulder: Secondary | ICD-10-CM | POA: Diagnosis not present

## 2014-07-03 DIAGNOSIS — M19011 Primary osteoarthritis, right shoulder: Secondary | ICD-10-CM | POA: Diagnosis not present

## 2014-07-03 MED ORDER — MELOXICAM 15 MG PO TABS: 15.0000 mg | ORAL_TABLET | Freq: Every day | ORAL | Status: DC

## 2014-07-03 NOTE — Telephone Encounter (Signed)
Please advise 

## 2014-07-03 NOTE — Assessment & Plan Note (Signed)
Diffuse right shoulder pain. Tenderness is very superficial to light touch and no consistent with OA or muscular inflammation or tear.  I question if this may be early shingles. No pain with passive ROM to suggest septic joint. No skin lesions, induration. Will get plain xray and start Meloxicam. Will monitor for now and recheck in 1-2 days. If symptoms not improving, plan for sports med evaluation.

## 2014-07-03 NOTE — Progress Notes (Signed)
Subjective:    Patient ID: [name], female    DOB: [birth year], 69 y.o.   MRN: [id number]  HPI  68YO female presents for acute visit.  Right shoulder pain - Started a few weeks ago, however much worse this last week. No trauma to shoulder. Shoulder is tender to touch all over. Described as burning or sharp pain. Worsened with any movement, however no weakness noted. No numbness. No redness or skin changes.[name] Hydrocodone with minimal improvement. No fever, chills.  Past medical, surgical, family and social history per today's encounter.  Review of Systems  Constitutional: Negative for fever, chills and fatigue.  Respiratory: Negative for cough and shortness of breath.   Cardiovascular: Negative for chest pain.  Gastrointestinal: Negative for abdominal pain.  Musculoskeletal: Positive for myalgias and arthralgias.  Skin: Negative for color change, rash and wound.  Psychiatric/Behavioral: Positive for sleep disturbance. Negative for dysphoric mood. The patient is not nervous/anxious.        Objective:    BP 168/98 mmHg  Pulse 70  Temp(Src) 98.5 F (36.9 C) (Oral)  Resp 14  Wt 239 lb 4 oz (108.523 kg)  SpO2 96% Physical Exam  Constitutional: She is oriented to person, place, and time. She appears well-developed and well-nourished. No distress.  HENT:  Head: Normocephalic and atraumatic.  Right Ear: External ear normal.  Left Ear: External ear normal.  Nose: Nose normal.  Mouth/Throat: Oropharynx is clear and moist.  Eyes: Conjunctivae are normal. Pupils are equal, round, and reactive to light. Right eye exhibits no discharge. Left eye exhibits no discharge. No scleral icterus.  Neck: Normal range of motion. Neck supple. No tracheal deviation present. No thyromegaly present.  Cardiovascular: Normal rate, regular rhythm, normal heart sounds and intact distal pulses.  Exam reveals no gallop and no friction rub.   No murmur heard. Pulmonary/Chest: Effort normal and  breath sounds normal. No respiratory distress. She has no wheezes. She has no rales. She exhibits no tenderness.  Musculoskeletal: Normal range of motion. She exhibits no edema.       Right shoulder: She exhibits tenderness and pain. She exhibits normal range of motion, no bony tenderness and normal strength.  Entire right shoulder is TTP to light touch both anteriorly and posteriorly up to mid clavicle and down over bicep tendon anteriorly. No palpable induration, effusion. No decreased ROM.   Lymphadenopathy:    She has no cervical adenopathy.  Neurological: She is alert and oriented to person, place, and time. No cranial nerve deficit. She exhibits normal muscle tone. Coordination normal.  Skin: Skin is warm and dry. No rash noted. She is not diaphoretic. No erythema. No pallor.  Psychiatric: She has a normal mood and affect. Her behavior is normal. Judgment and thought content normal.          Assessment & Plan:   Problem List Items Addressed This Visit      Unprioritized   Right shoulder pain - Primary    Diffuse right shoulder pain. Tenderness is very superficial to light touch and no consistent with OA or muscular inflammation or tear.  I question if this may be early shingles. No pain with passive ROM to suggest septic joint. No skin lesions, induration. Will get plain xray and start Meloxicam. Will monitor for now and recheck in 1-2 days. If symptoms not improving, plan for sports med evaluation.      Relevant Medications   meloxicam (MOBIC) tablet   Other Relevant Orders   DG  Shoulder Right       Return in about 3 days (around 07/06/2014) for Recheck.

## 2014-07-03 NOTE — Telephone Encounter (Signed)
Called patient and scheduled at 3.15 due to right shoulder pain, patient stated as severe. FYI

## 2014-07-03 NOTE — Telephone Encounter (Signed)
She will need to be seen and evaluated. We can try to work her in today at 4:30pm or 3:15pm if pain is severe.

## 2014-07-03 NOTE — Patient Instructions (Signed)
Start Meloxicam 15mg  daily  Xray today of right shoulder at [facility].  Follow up Thursday.

## 2014-07-06 ENCOUNTER — Telehealth: Payer: Self-pay

## 2014-07-06 ENCOUNTER — Encounter: Payer: Self-pay | Admitting: Internal Medicine

## 2014-07-06 NOTE — Telephone Encounter (Signed)
The patient called and stated her chart is showing she is a type 1 diabetic.  However, she states she is a type 2.  Can this be changed in her history? Thanks!

## 2014-07-06 NOTE — Telephone Encounter (Signed)
I have fixed this in her chart. Please let her know

## 2014-07-17 DIAGNOSIS — M25511 Pain in right shoulder: Secondary | ICD-10-CM | POA: Diagnosis not present

## 2014-07-17 DIAGNOSIS — M542 Cervicalgia: Secondary | ICD-10-CM | POA: Diagnosis not present

## 2014-07-19 DIAGNOSIS — H40053 Ocular hypertension, bilateral: Secondary | ICD-10-CM | POA: Diagnosis not present

## 2014-07-19 DIAGNOSIS — E119 Type 2 diabetes mellitus without complications: Secondary | ICD-10-CM | POA: Diagnosis not present

## 2014-07-19 DIAGNOSIS — Z794 Long term (current) use of insulin: Secondary | ICD-10-CM | POA: Diagnosis not present

## 2014-07-20 DIAGNOSIS — M7581 Other shoulder lesions, right shoulder: Secondary | ICD-10-CM | POA: Diagnosis not present

## 2014-07-20 DIAGNOSIS — M4802 Spinal stenosis, cervical region: Secondary | ICD-10-CM | POA: Diagnosis not present

## 2014-07-20 DIAGNOSIS — M5022 Other cervical disc displacement, mid-cervical region: Secondary | ICD-10-CM | POA: Diagnosis not present

## 2014-07-20 DIAGNOSIS — M542 Cervicalgia: Secondary | ICD-10-CM | POA: Diagnosis not present

## 2014-07-20 DIAGNOSIS — M19011 Primary osteoarthritis, right shoulder: Secondary | ICD-10-CM | POA: Diagnosis not present

## 2014-07-20 DIAGNOSIS — F439 Reaction to severe stress, unspecified: Secondary | ICD-10-CM | POA: Diagnosis not present

## 2014-07-20 DIAGNOSIS — M75101 Unspecified rotator cuff tear or rupture of right shoulder, not specified as traumatic: Secondary | ICD-10-CM | POA: Diagnosis not present

## 2014-07-20 DIAGNOSIS — M25511 Pain in right shoulder: Secondary | ICD-10-CM | POA: Diagnosis not present

## 2014-07-24 DIAGNOSIS — M25511 Pain in right shoulder: Secondary | ICD-10-CM | POA: Diagnosis not present

## 2014-07-24 DIAGNOSIS — M542 Cervicalgia: Secondary | ICD-10-CM | POA: Diagnosis not present

## 2014-07-25 DIAGNOSIS — M5412 Radiculopathy, cervical region: Secondary | ICD-10-CM | POA: Diagnosis not present

## 2014-07-31 DIAGNOSIS — M5412 Radiculopathy, cervical region: Secondary | ICD-10-CM | POA: Diagnosis not present

## 2014-08-09 ENCOUNTER — Telehealth: Payer: Self-pay | Admitting: *Deleted

## 2014-08-09 ENCOUNTER — Other Ambulatory Visit (INDEPENDENT_AMBULATORY_CARE_PROVIDER_SITE_OTHER): Payer: Medicare Other

## 2014-08-09 DIAGNOSIS — E669 Obesity, unspecified: Secondary | ICD-10-CM | POA: Diagnosis not present

## 2014-08-09 DIAGNOSIS — E119 Type 2 diabetes mellitus without complications: Secondary | ICD-10-CM

## 2014-08-09 DIAGNOSIS — E1169 Type 2 diabetes mellitus with other specified complication: Secondary | ICD-10-CM

## 2014-08-09 LAB — COMPREHENSIVE METABOLIC PANEL
ALT: 13 U/L (ref 0–35)
AST: 14 U/L (ref 0–37)
Albumin: 4.2 g/dL (ref 3.5–5.2)
Alkaline Phosphatase: 52 U/L (ref 39–117)
BILIRUBIN TOTAL: 0.3 mg/dL (ref 0.2–1.2)
BUN: 14 mg/dL (ref 6–23)
CO2: 34 meq/L — AB (ref 19–32)
Calcium: 10.2 mg/dL (ref 8.4–10.5)
Chloride: 99 mEq/L (ref 96–112)
Creatinine, Ser: 0.74 mg/dL (ref 0.40–1.20)
GFR: 82.75 mL/min (ref >60.00)
Glucose, Bld: 41 mg/dL — CL (ref 70–99)
POTASSIUM: 3.3 meq/L — AB (ref 3.5–5.1)
Sodium: 140 mEq/L (ref 135–145)
TOTAL PROTEIN: 7.4 g/dL (ref 6.0–8.3)

## 2014-08-09 LAB — HEMOGLOBIN A1C: Hgb A1c MFr Bld: 8.2 % — ABNORMAL HIGH (ref 4.6–6.5)

## 2014-08-09 NOTE — Telephone Encounter (Signed)
CMP and A1c for diabetes

## 2014-08-09 NOTE — Telephone Encounter (Signed)
What labs and dx?  

## 2014-08-15 ENCOUNTER — Ambulatory Visit (INDEPENDENT_AMBULATORY_CARE_PROVIDER_SITE_OTHER): Payer: Medicare Other | Admitting: Internal Medicine

## 2014-08-15 ENCOUNTER — Encounter: Payer: Self-pay | Admitting: Internal Medicine

## 2014-08-15 VITALS — BP 135/80 | HR 69 | Temp 98.2°F | Ht 62.5 in | Wt 242.1 lb

## 2014-08-15 DIAGNOSIS — I10 Essential (primary) hypertension: Secondary | ICD-10-CM | POA: Diagnosis not present

## 2014-08-15 DIAGNOSIS — M5030 Other cervical disc degeneration, unspecified cervical region: Secondary | ICD-10-CM | POA: Diagnosis not present

## 2014-08-15 DIAGNOSIS — M25511 Pain in right shoulder: Secondary | ICD-10-CM

## 2014-08-15 DIAGNOSIS — E669 Obesity, unspecified: Secondary | ICD-10-CM

## 2014-08-15 DIAGNOSIS — E119 Type 2 diabetes mellitus without complications: Secondary | ICD-10-CM | POA: Diagnosis not present

## 2014-08-15 DIAGNOSIS — H6693 Otitis media, unspecified, bilateral: Secondary | ICD-10-CM

## 2014-08-15 DIAGNOSIS — E1169 Type 2 diabetes mellitus with other specified complication: Secondary | ICD-10-CM

## 2014-08-15 DIAGNOSIS — H6690 Otitis media, unspecified, unspecified ear: Secondary | ICD-10-CM | POA: Insufficient documentation

## 2014-08-15 DIAGNOSIS — M47812 Spondylosis without myelopathy or radiculopathy, cervical region: Secondary | ICD-10-CM | POA: Insufficient documentation

## 2014-08-15 MED ORDER — LEVOFLOXACIN 500 MG PO TABS: 500.0000 mg | ORAL_TABLET | Freq: Every day | ORAL | Status: DC

## 2014-08-15 MED ORDER — GUAIFENESIN-CODEINE 100-10 MG/5ML PO SOLN: 5.0000 mL | Freq: Three times a day (TID) | ORAL | Status: DC | PRN

## 2014-08-15 NOTE — Assessment & Plan Note (Signed)
BP Readings from Last 3 Encounters:  08/15/14 135/80  07/03/14 168/98  05/19/14 159/79   BP well controlled, despite pt stopping amlodipine. Will monitor. Follow up in 4 weeks.

## 2014-08-15 NOTE — Patient Instructions (Addendum)
Start Levaquin 500mg  daily to help to treat ear and lung infection.  Use Robitussin AC as needed for cough.  Follow up in 4 weeks.

## 2014-08-15 NOTE — Progress Notes (Signed)
Pre visit review using our clinic review tool, if applicable. No additional management support is needed unless otherwise documented below in the visit note. 

## 2014-08-15 NOTE — Assessment & Plan Note (Signed)
Exam c/w bilateral OM. Start Levaquin. Will add Robitussin AC for cough. Follow up recheck in 4 weeks or sooner as needed.

## 2014-08-15 NOTE — Assessment & Plan Note (Signed)
Lab Results  Component Value Date   HGBA1C 8.2* 08/09/2014   BG continue to be elevated, however recently had steroid injection which may have contributed. Will continue to monitor. Continue Levemir and Metformin.Follow up in 4 weeks.

## 2014-08-15 NOTE — Progress Notes (Signed)
Subjective:    Patient ID: [name], female    DOB: Sep 24, 1945, 69 y.o.   MRN: [id number]  HPI 69YO female presents for acute visit.  Cough - Since last Thursday. Mild fever Fri and Sat. Notes nasal congestion. Sore throat initially, now improved. No known sick contacts. Feels short of breath with cough. Some chest pain with cough.  Right shoulder pain - Seen at [facility].Recommended both surgery to repair torn right rotator cuff and neck surgery. Had steroid injection neck with some improvement.  DM - BG have been elevated ever since steroid injection. Compliant with Levemir 60units bid.  HTN - BP was running low 010X systolic. Stopped Amlodipine.  Past medical, surgical, family and social history per today's encounter.  Review of Systems  Constitutional: Positive for fatigue. Negative for fever, chills, appetite change and unexpected weight change.  HENT: Positive for congestion, postnasal drip, rhinorrhea and sore throat. Negative for sinus pressure, trouble swallowing and voice change.   Eyes: Negative for visual disturbance.  Respiratory: Positive for cough and shortness of breath. Negative for chest tightness and wheezing.   Cardiovascular: Positive for chest pain. Negative for leg swelling.  Gastrointestinal: Negative for nausea, vomiting, abdominal pain, diarrhea and constipation.  Musculoskeletal: Positive for myalgias and arthralgias.  Skin: Negative for color change and rash.  Neurological: Positive for weakness. Negative for numbness.  Hematological: Negative for adenopathy. Does not bruise/bleed easily.  Psychiatric/Behavioral: Negative for sleep disturbance and dysphoric mood. The patient is not nervous/anxious.        Objective:    BP 135/80 mmHg  Pulse 69  Temp(Src) 98.2 F (36.8 C) (Oral)  Ht 5' 2.5" (1.588 m)  Wt 242 lb 2 oz (109.827 kg)  BMI 43.55 kg/m2  SpO2 96% Physical Exam  Constitutional: She is oriented to person, place, and time.  She appears well-developed and well-nourished. No distress.  HENT:  Head: Normocephalic and atraumatic.  Right Ear: External ear normal. Tympanic membrane is erythematous and bulging. A middle ear effusion is present.  Left Ear: External ear normal. Tympanic membrane is erythematous and bulging. A middle ear effusion is present.  Nose: Nose normal.  Mouth/Throat: Oropharynx is clear and moist. No oropharyngeal exudate.  Eyes: Conjunctivae are normal. Pupils are equal, round, and reactive to light. Right eye exhibits no discharge. Left eye exhibits no discharge. No scleral icterus.  Neck: Normal range of motion. Neck supple. No tracheal deviation present. No thyromegaly present.  Cardiovascular: Normal rate, regular rhythm, normal heart sounds and intact distal pulses.  Exam reveals no gallop and no friction rub.   No murmur heard. Pulmonary/Chest: Effort normal. No accessory muscle usage. No respiratory distress. She has no decreased breath sounds. She has no wheezes. She has rhonchi (few scattered). She has no rales. She exhibits no tenderness.  Musculoskeletal: She exhibits no edema.       Right shoulder: She exhibits decreased range of motion, pain and decreased strength. She exhibits no tenderness.  Lymphadenopathy:    She has no cervical adenopathy.  Neurological: She is alert and oriented to person, place, and time. No cranial nerve deficit. She exhibits normal muscle tone. Coordination normal.  Skin: Skin is warm and dry. No rash noted. She is not diaphoretic. No erythema. No pallor.  Psychiatric: She has a normal mood and affect. Her behavior is normal. Judgment and thought content normal.          Assessment & Plan:   Problem List Items Addressed This Visit  Unprioritized   Diabetes mellitus type 2 in obese - Primary    Lab Results  Component Value Date   HGBA1C 8.2* 08/09/2014   BG continue to be elevated, however recently had steroid injection which may have  contributed. Will continue to monitor. Continue Levemir and Metformin.Follow up in 4 weeks.      DJD (degenerative joint disease), cervical    Pt reports recent diagnosis of spinal stenosis. Will continue Meloxicam. Follow up with [facility] as scheduled.      Essential hypertension, benign    BP Readings from Last 3 Encounters:  08/15/14 135/80  07/03/14 168/98  05/19/14 159/79   BP well controlled, despite pt stopping amlodipine. Will monitor. Follow up in 4 weeks.      Otitis media    Exam c/w bilateral OM. Start Levaquin. Will add Robitussin AC for cough. Follow up recheck in 4 weeks or sooner as needed.      Relevant Medications   levofloxacin (LEVAQUIN) tablet   Guaifenesin-codeine 100-10 mg/30mL po sol   Right shoulder pain    Right shoulder pain with rotator cuff tear. 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          Return in about 4 weeks (around 09/12/2014) for Recheck.

## 2014-08-15 NOTE — Assessment & Plan Note (Signed)
Right shoulder pain with rotator cuff tear. 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

## 2014-08-15 NOTE — Assessment & Plan Note (Signed)
Pt reports recent diagnosis of spinal stenosis. Will continue Meloxicam. Follow up with [facility] as scheduled.

## 2014-09-01 NOTE — Consult Note (Signed)
General Aspect [name] is a pleasant 69 year old Caucasian female with past medical history of type 2 diabetes and morbid obesity, hyperlipidemia,  who comes into the Emergency Room with complaints of chest pain and pain in between her shoulder blades for the last 2 days. Cardiology was consulted for chest pain, possible anginal sx.   Overall she has not been feeling well for about a week. She went to her primary care physician's office for a flu shot and a Pneumovax and reported to them having chest pain and pain in between the shoulder blades. She was sent to the Emergency Room for further evaluation and management.   In the ER the patient received some aspirin. She received IV fluids and she became diaphoretic, noted to have a low BP.  D-dimer was elevated and she had a V/Q scan that showed no PE. She was felt to be higher risk as she had recent right ankle fracture and was sedentary.  She was admitted for chest pain and further workup, r/o for MI.   Overnight she continued to have waxing waning chest pain.   Present Illness . Social hx: no smoking or significant ETOH, sedentary, help at home from family  Family hx: Mother alive and well, father with MI at 12 yo   Physical Exam:  GEN well developed, well nourished, no acute distress, obese   HEENT hearing intact to voice, moist oral mucosa   NECK supple   RESP normal resp effort  clear BS  scant rales at the bases   CARD Regular rate and rhythm  No murmur   ABD denies tenderness  soft   LYMPH negative neck   EXTR negative edema   SKIN normal to palpation   NEURO motor/sensory function intact   PSYCH alert, A+O to time, place, person, good insight   Review of Systems:  Subjective/Chief Complaint Chest pain, resolved, nausea, sweating, palpitations   General: No Complaints   Skin: No Complaints   ENT: No Complaints   Eyes: No Complaints   Neck: No Complaints   Respiratory: No Complaints   Cardiovascular:  Chest pain or discomfort  Palpitations   Gastrointestinal: No Complaints   Genitourinary: No Complaints   Vascular: No Complaints   Musculoskeletal: No Complaints   Neurologic: No Complaints   Hematologic: No Complaints   Endocrine: No Complaints   Psychiatric: No Complaints   Review of Systems: All other systems were reviewed and found to be negative   Medications/Allergies Reviewed Medications/Allergies reviewed     double pneumonia: 1995   blood clot: Jan 2005   polyps:    DM type 2:    PL fusion L4-5,decompression L3-4,L4-5,L5-S1: 17-Nov-2007   carpal tunnel x 2:    hernia operation:    hysterectomy:    numbness to left hand and wrist area due to previous picc:    18 inches of colon removed:        Admit Diagnosis:   CHEST PAIN: Onset Date: 10-Feb-2013, Status: Active, Description: CHEST PAIN  Home Medications: Medication Instructions Status  aspirin 325 mg oral tablet 1 tab(s) orally once a day Active  metFORMIN 1000 mg oral tablet 1 tab(s) orally 2 times a day Active  HumaLOG 100 units/mL subcutaneous solution 4 to 6 unit(s) subcutaneous  every evening with dinner Active  Victoza 18 mg/3 mL subcutaneous solution 1.8 milligram(s) subcutaneous once a day (in the morning) Active  Levemir 100 units/mL subcutaneous solution 50-60 unit(s) subcutaneous once a day (in the evening) Active  Lab Results:  Hepatic:  01-Oct-14 12:20   Bilirubin, Total 0.3  Alkaline Phosphatase 73  SGPT (ALT) 21  SGOT (AST) 25  Total Protein, Serum 7.8  Albumin, Serum 3.9  Routine Chem:  01-Oct-14 12:20   Cholesterol, Serum 177  Triglycerides, Serum 158  HDL (INHOUSE)  61  VLDL Cholesterol Calculated 32  LDL Cholesterol Calculated 84 (Result(s) reported on 09 Feb 2013 at 02:24PM.)  Glucose, Serum  152  BUN 13  Creatinine (comp) 0.89  Sodium, Serum 140  Potassium, Serum 3.7  Chloride, Serum 106  CO2, Serum 28  Calcium (Total), Serum 8.9  Osmolality (calc) 282   eGFR (African American) >60  eGFR (Non-African American) >60 (eGFR values <7m/min/1.73 m2 may be an indication of chronic kidney disease (CKD). Calculated eGFR is useful in patients with stable renal function. The eGFR calculation will not be reliable in acutely ill patients when serum creatinine is changing rapidly. It is not useful in  patients on dialysis. The eGFR calculation may not be applicable to patients at the low and high extremes of body sizes, pregnant women, and vegetarians.)  Anion Gap  6  B-Type Natriuretic Peptide ([facility]) 125 (Result(s) reported on 09 Feb 2013 at 12:52PM.)  Cardiac:  01-Oct-14 12:20   CK, Total 84  CPK-MB, Serum 1.0 (Result(s) reported on 09 Feb 2013 at 12:52PM.)  Troponin I < 0.02 (0.00-0.05 0.05 ng/mL or less: NEGATIVE  Repeat testing in 3-6 hrs  if clinically indicated. >0.05 ng/mL: POTENTIAL  MYOCARDIAL INJURY. Repeat  testing in 3-6 hrs if  clinically indicated. NOTE: An increase or decrease  of 30% or more on serial  testing suggests a  clinically important change)    20:11   Troponin I < 0.02 (0.00-0.05 0.05 ng/mL or less: NEGATIVE  Repeat testing in 3-6 hrs  if clinically indicated. >0.05 ng/mL: POTENTIAL  MYOCARDIAL INJURY. Repeat  testing in 3-6 hrs if  clinically indicated. NOTE: An increase or decrease  of 30% or more on serial  testing suggests a  clinically important change)  02-Oct-14 04:17   Troponin I < 0.02 (0.00-0.05 0.05 ng/mL or less: NEGATIVE  Repeat testing in 3-6 hrs  if clinically indicated. >0.05 ng/mL: POTENTIAL  MYOCARDIAL INJURY. Repeat  testing in 3-6 hrs if  clinically indicated. NOTE: An increase or decrease  of 30% or more on serial  testing suggests a  clinically important change)  Routine Coag:  01-Oct-14 12:20   D-Dimer, Quantitative  0.90 (If the D-dimer test is being used to assist in the exclusion of DVT and/or PE, note the following:  In various studies concerning the D-dimer  methodology (STA Liatest) in use by this laboratory, it has been reported that with a cut-off value of 0.50 ug/mL FEU, the  negative predictive value regarding the exclusion of thrombosis is within the 95-100% range.  In patients with high pre-test probability of DVT/PE the results of the D-dimer test should be correlated with other diagnostic and clinical assessment modalities." Reference: [employer], 2005.)  Prothrombin 13.3  INR 1.0 (INR reference interval applies to patients on anticoagulant therapy. A single INR therapeutic range for coumarins is not optimal for all indications; however, the suggested range for most indications is 2.0 - 3.0. Exceptions to the INR Reference Range may include: Prosthetic heart valves, acute myocardial infarction, prevention of myocardial infarction, and combinations of aspirin and anticoagulant. The need for a higher or lower target INR must be assessed individually. Reference: The Pharmacology and Management of the Vitamin  K  antagonists: the seventh ACCP Conference on Antithrombotic and Thrombolytic Therapy. GLOVF.6433 Sept:126 (3suppl): [id number]. A HCT value >55% may artifactually increase the PT.  In one study,  the increase was an average of 25%. Reference:  "Effect on Routine and Special Coagulation Testing Values of Citrate Anticoagulant Adjustment in Patients with High HCT Values." American Journal of Clinical Pathology 2006;126:400-405.)  Routine Hem:  01-Oct-14 12:20   WBC (CBC)  12.1  RBC (CBC) 4.60  Hemoglobin (CBC) 13.8  Hematocrit (CBC) 40.5  Platelet Count (CBC) 377 (Result(s) reported on 09 Feb 2013 at 12:38PM.)  MCV 88  MCH 30.0  MCHC 34.1  RDW 13.8   EKG:  Interpretation EKG shows NSR with no significant ST or T wave changes   Radiology Results: XRay:    01-Oct-14 12:15, Chest Portable Single View  Chest Portable Single View   REASON FOR EXAM:    Chest Pain  COMMENTS:       PROCEDURE: DXR - DXR  PORTABLE CHEST SINGLE VIEW  - Feb 09 2013 12:15PM     RESULT: The lungs are clear. The cardiac silhouette and visualized bony   skeleton are unremarkable.    IMPRESSION:      1. Chest radiograph without evidence of acute cardiopulmonary disease.   2. Comparison made to prior study dated 10/17/2010        Verified By: Mikki Santee, M.D., MD  Nuclear Med:    01-Oct-14 15:44, Lung VQ Scan - Nuc Med  Lung VQ Scan - Nuc Med   REASON FOR EXAM:    dyspnea, chest pain, h/o prior PE, elevated D-dimer,   contrast allergy  COMMENTS:       PROCEDURE: NM  - NM VQ LUNG SCAN  - Feb 09 2013  3:44PM     RESULT: Nuclear medicine ventilation/perfusion evaluation dated 02/09/2013    Procedure:    Radiopharmaceutical:    Ventilation: 42.8 mCi of technetium 36 M labeled DTPA administered via   nebulizer  Perfusion: 4.3 mCi of technetium 10 M and a administered the IV right   antecubital    Standard scintigraphic imaging of the chest was obtained during the   ventilation and perfusion portions of the study.    Comparison: Chest radiograph dated 02/09/2013    Findings:     The chest radiograph demonstrates no evidence of focal infiltrates   effusions or edema.    There is no evidence of wedge-shaped pleural-based ventilation/perfusion   mismatched defects.  IMPRESSION:  Low probability ventilation/perfusion evaluation for   pulmonary arterial embolic disease.        Verified By: Mikki Santee, M.D., MD    Buprenex: Alt Ment Status, Blurred Vision, Dizzy/Fainting, Hypertension  Contrast - Iodinated Radiocontrast Dye: Swelling  Sulfa drugs: Unknown  Ventolin: Unknown  Vital Signs/Nurse's Notes: **Vital Signs.:   02-Oct-14 07:28  Vital Signs Type Routine  Celsius 36.5  Temperature Source oral  Pulse Pulse 64  Respirations Respirations 20  Systolic BP Systolic BP 295  Diastolic BP (mmHg) Diastolic BP (mmHg) 76  Mean BP 89  Pulse Ox % Pulse Ox % 95  Pulse Ox Activity Level   At rest  Oxygen Delivery Room Air/ 21 %    Impression [name] is a pleasant 69 year old Caucasian female with past medical history of type 2 diabetes and morbid obesity, hyperlipidemia,  who comes into the Emergency Room with complaints of chest pain and pain in between her shoulder blades for the last 2  days. Cardiology was consulted for chest pain, possible anginal sx.   1) Chest pain D-dimer elevated, V/Q scan low prob of PE She does have significant risk factors for CAD and angina Chest sx somewhat atypical (coming on at rest), some dizziness with uncertain etiology Given poorly controlled diabetes, hyperlipidemia, would schedule an outpt stress test. Unable to perform a stress test tomorrow as we need 48 hours from teh V/Q scan. troponin negative x 3, CKMB neg. --Would ambulate, consider d/c to home with NTG sl 0.4 mg tabs prn  2) Diabetes: followed by Dr. Gilford Rile HBA1C 8 recently  3) Hyperlipidemia LDL 150 as an outpt needs a statin.  lipitor 10 mg to start, with outpt titration upwards  4) Palpitations APCs and PVCs Symptomatic at times. Suggested an outpt holter, even 30 day monitor if needed She does report having "runs of fast heart rate" sometimes at night. Unable to exclude atrial tach or other arrhythmia. Unable to start b-blocker given low heart rate  5)Obesity encouraged diet modification   Electronic Signatures: Ida Rogue (MD)  (Signed 02-Oct-14 13:09)  Authored: General Aspect/Present Illness, History and Physical Exam, Review of System, Past Medical History, Health Issues, Home Medications, Labs, EKG , Radiology, Allergies, Vital Signs/Nurse's Notes, Impression/Plan   Last Updated: 02-Oct-14 13:09 by Ida Rogue (MD)

## 2014-09-01 NOTE — Discharge Summary (Signed)
PATIENT NAME:  [name], [name] MR#:  [id number] DATE OF BIRTH:  [birth year]  DATE OF ADMISSION:  02/09/2013 DATE OF DISCHARGE:  02/10/2013  REASON FOR ADMISSION: Chest pain.   FINAL DIAGNOSES: 1.  Likely atypical chest pain secondary to muscle or chest wall pain.  2.  Morbid obesity.  3.  Type 2 diabetes.  4.  History of previous pulmonary embolus. 5.  History of previous deep vein thrombosis.  6.  Colon polyps.  7.  Back pain, chronic.   DISPOSITION: Home.   FOLLOWUP: Dr. Ronette Deter. Follow up with Dr. Ida Rogue for a stress test next Tuesday and possible Holter monitoring.   MEDICATIONS AT DISCHARGE:  Aspirin 325 mg once daily, metformin 100 mg twice daily, Humalog 46 units subcutaneously with dinner in the evening, apidra18 mg in 3 mL, take 1.8 mg subcutaneously in the morning. Levemir 50 to 60 units subcutaneously in the evening, pravastatin 20 mg once a day, nitroglycerin 0.4 mg sublingual for chest pain.   IMPORTANT RESULTS: Cardiac enzymes were negative x 3. Blood sugars were in between 86 to 200. LFTs were within normal limits. Creatinine of 0.89. Again troponin negative x 3. White count 12,000, hemoglobin 13.8. D-dimer was slightly elevated at 0.90.   EKG: Normal sinus rhythm with sinus arrhythmia. No significant ST elevation or depression. The patient had some PACs and left ventricular hypertrophy criteria.   VQ scan to rule out pulmonary embolism was done, showing low probability ventilation and perfusion defects.   HOSPITAL COURSE: This is a very nice 69 year old female who has history of morbid obesity, hypertension, diabetes came to the ER on 02/09/2013 complaining of chest pain in between the shoulder blades that was going on for 2 days continuously. The patient has not been feeling good for over a week. Her primary care physician saw her in the office for a flu shot and since the patient was complaining of chest pain, she was sent to the Emergency Department. The  chest pain was located mostly in between the shoulder break and since the patient had a history of DVT in the past and a possible pulmonary embolism, she was scheduled for a VQ scan VQ scan was done with low probability. Since the patient continued to have chest pain, further evaluation was needed with Myoview Lexiscan.  The patient was not able to tolerate a regular treadmill stress test due to history of broken ankle that is healing, so we were not able to do a Lexiscan  based on the fact that the patient got radioisotopes for her VQ scan and she needs to wait at least 48 hours to complete another nuclear scan.   The patient was seen by Dr. Rockey Situ due to the severity of her symptoms. Dr. Rockey Situ talked about the possibility of this chest pain being secondary to muscle but at the same time since she has multiple risk factors, she will need a stress test. The stress test was set up to be done at [facility] clinic in the next 3 to 4 days.   The patient also has been complaining of occasionally skipping beats.  Her doctor, Dr. Gilford Rile has noticed arrhythmias during physical examination EKG but did not show any significant rhythm abnormalities or sinus arrhythmia. The patient could have occasional PACs or PVCs or going into atrial fibrillation, paroxysmally in and out for which a Holter study was recommended and she is going to have that done with Cardiology.   As far as her blood sugar, it seems  to be overall is stable and followed very closely by Dr. Ronette Deter.   Her blood pressure remained stable during this hospitalization, 117/76 for the most part. She was afebrile. Her heart rate remained in the 50s to 60s.   The patient was discharged in good condition with close follow-up by primary care physician and cardiology.   TIME SPENT: I spent about 45 minutes with this discharge.   ____________________________ Keo Sink, MD rsg:dp D: 02/11/2013 07:18:04 ET T: 02/11/2013 07:38:23  ET JOB#: [id number]  cc: Loami Sink, MD, <Dictator> Eduard Clos. Gilford Rile, MD Minna Merritts, MD  Geovanna Simko America Brown MD ELECTRONICALLY SIGNED 02/15/2013 23:35

## 2014-09-01 NOTE — H&P (Signed)
PATIENT NAME:  Brenda, Larson MR#:  [id number] DATE OF BIRTH:  [birth year]   PRIMARY CARE PHYSICIAN: Ronette Deter, M.D.   CHIEF COMPLAINT: Chest pain and pain in between shoulder blades for the last for 2 to 4  days. Overall not feeling well for about a week.   HISTORY OF PRESENT ILLNESS: [name] is a pleasant 69 year old Caucasian female with past medical history of type 2 diabetes and morbid obesity who comes into the Emergency Room after she started having complaints of chest pain and pain in between her shoulder blades for the last 2 days. Overall she has not been feeling well for about a week. She went to her primary care physician's office for a flu shot and a Pneumovax and raised her concerns about chest pain and pain in between the shoulder blades and hence was sent to the Emergency Room for further evaluation and management.   In the ER the patient received some aspirin. She received IV fluids and she became diaphoretic. Her blood pressure dropped one time and felt very dizzy and lightheaded. During my evaluation at present, the patient does have some chest discomfort which radiated to the neck earlier and pain in between the shoulder blades.   She is being admitted for chest pain and further workup and evaluation for the same. Risk factors are long-standing history of type 2 diabetes and family history of coronary artery disease.   PAST MEDICAL HISTORY:  1.  Past history of presumed PE.  2.  DVT.  3.  Type 2 diabetes.  4.  History of polyps. 5.  History of a large ulcerative polyp requiring removal of part of her intestine.  6.  Fusion, L4-L5, decompression surgery.  7.  Carpal tunnel x2.  8.  Hysterectomy.   ALLERGIES: BUPRENEX, CONTRAST IODINATED DYE, SULFA AND VENTOLIN.   MEDICATIONS:  1.  Victoza 1.8 mg subcu once a day in the morning.  2.  Metformin 1000 mg b.i.d.  3.  Levemir 50 to 60 units in the evening.  4.  Humalog 2 to four units subcutaneous according to sliding  scale.  5.  Aspirin 325 mg p.o. daily.   REVIEW OF SYSTEMS: CONSTITUTIONAL: No fever, fatigue, weakness.  EYES: No blurred or double vision, glaucoma or cataracts.  ENT: No tinnitus, ear pain, hearing loss, postnasal drip.  RESPIRATORY: No cough, wheeze, hemoptysis, asthma.  CARDIOVASCULAR: Positive for chest pain and pain in between the shoulder blades. No edema, arrhythmia or dyspnea on exertion or palpitations.  GASTROINTESTINAL: No nausea, vomiting, diarrhea or abdominal pain, hematemesis, or GERD. GENITOURINARY: No dysuria, hematuria, frequency, or incontinence.  ENDOCRINE: No polyuria, nocturia or thyroid problems.  HEMATOLOGY: No anemia, easy bruising or bleeding.  SKIN: No acne or rash.  MUSCULOSKELETAL: Positive for pain in between shoulder blades. No arthritis, swelling, or gout.  NEUROLOGIC: No CVA, TIA, dysarthria or dementia.  PSYCHIATRIC: No anxiety, depression, bipolar disorder or schizophrenia.   All other systems reviewed and negative.   PHYSICAL EXAMINATION:  GENERAL: Awake, alert, oriented x3, not in acute distress.  VITAL SIGNS: Afebrile. Pulse is 69. Blood pressure is 136/84. Saturations are 97% on room air.  HEENT: Atraumatic, normocephalic. Pupils PERRLA. EOM intact. Oral mucosa is moist.  NECK: Supple. No JVD. No carotid bruit.  LUNGS: Clear to auscultation bilaterally. No rales, rhonchi, respiratory distress or labored breathing.  CARDIOVASCULAR: Both the heart sounds are normal. Rate, rhythm regular. PMI not lateralized. Chest is nontender. Good pedal pulses, good femoral pulses. No lower extremity  edema.  ABDOMEN: Soft, benign, nontender. No organomegaly. Positive bowel sounds.  NEUROLOGIC: Grossly intact cranial nerves II through XII. No motor or sensory deficits. The patient is awake, alert, oriented x3.  DIAGNOSTIC STUDIES: EKG shows normal sinus rhythm with left axis deviation and minimal voltage criteria for LVH. Cardiac enzymes, first set, is negative.  CBC and a comprehensive metabolic panel within normal limits except glucose of 152.    CHEST X-RAY: No acute cardiopulmonary disease.   ASSESSMENT AND PLAN: [name] is a 69 year old with history of diabetes and morbid obesity who comes in with: 1.  Chest pain. The patient will be admitted on telemetry floor for overnight observation. We will cycle cardiac enzymes x3, give nitroglycerin paste, p.r.n. morphine, check lipid profile, give  metoprolol 25 mg b.i.d., and continue aspirin. Will have Dr. Rockey Situ see the patient in consultation.  2.  Type 2 diabetes, well controlled, with Levemir, Humalog sliding scale, and Victoza subcutaneous shots along with p.o. metformin. Will continue insulin regimen; however, I will hold off on metformin as there could be a possibility the patient may get cardiac catheterization depending on her symptoms.  3.  Accelerated hypertension without diagnosis of hypertension. The patient is on metoprolol at present. Will give p.r.n. hydralazine if needed.  4.  History of deep vein thrombosis and  presumed pulmonary embolism in the past. Will keep the patient on deep vein thrombosis prophylaxis with subcutaneous heparin.  5.  Elevated D-dimer. THE PATIENT HAS ALLERGIES TO CONTRAST DYE, and the Emergency Room physician has ordered lung V/Q scan given her history of deep vein thrombosis in both upper and lower extremities along with possible pulmonary embolism, which the patient does not remember the details of. It was back 2005, and she took Coumadin for a long time. She is off of Coumadin at this time.  6.  Further work-up according to the patient's clinical course. Hospital admission plan was discussed with the patient and the patient's family members.   TIME SPENT: 50 minutes.   ____________________________ Hart Rochester Posey Pronto, MD sap:np D: 02/09/2013 14:09:42 ET T: 02/09/2013 15:04:56 ET JOB#: [id number]  cc: Saleha Kalp A. Posey Pronto, MD, <Dictator> Eduard Clos. Gilford Rile, MD Ilda Basset  MD ELECTRONICALLY SIGNED 02/09/2013 18:06

## 2014-09-03 NOTE — Op Note (Signed)
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   PROCEDURE PERFORMED:   1. Ultrasound guidance for vascular access to left basilic vein.  2. Left upper extremity venogram and central venogram.  3. Catheter placement into left subclavian vein.   SURGEON: Algernon Huxley, MD   ANESTHESIA: Local with moderate conscious sedation.   ESTIMATED BLOOD LOSS: Minimal.   FLUOROSCOPY TIME: Approximately 2 minutes.   CONTRAST USED: 20 mL.   INDICATION FOR PROCEDURE: This is a female who saw me in the office earlier this week. She has had significant left arm pain and swelling, including left neck swelling. She has a history of central venous catheter placement on the left several years ago with a severe illness with prolonged immobility. This was complicated by multiple abdominal surgeries and lower extremity deep vein thrombosis. She does not know if she had an upper extremity deep vein thrombosis, but this is certainly a possibility. Upper extremity ultrasound did not show any thrombotic issues, but she is brought for a left upper extremity venogram and central venogram for further evaluation. The risks and benefits were discussed. Informed consent was obtained.   DESCRIPTION OF PROCEDURE: The patient was brought to the Vascular Interventional Radiology Suite. The left upper extremity was sterilely prepped and draped, and a sterile surgical field was created. The basilic vein was accessed with minimal difficulty with a micropuncture needle. A micropuncture wire and sheath were placed. Imaging through the  micropuncture sheath showed normal flow into the basilic vein draining into the brachial vein in the mid arm. This was then normal through the axillary and subclavian veins. Central venous flow appeared normal, but for further evaluation we placed a Kumpe catheter into the left subclavian vein, and magnified views were performed. This showed no significant stenosis or thrombosis within the left subclavian, innominate vein or superior vena cava. A small amount of contrast refluxed up the jugular vein, and the jugular vein was seen to empty well as well. At this point, the procedure was concluded. The diagnostic catheter was removed. Pressure was held. Sterile dressing was placed. The patient tolerated the procedure well and was taken to the recovery room in stable condition.    ____________________________ Algernon Huxley, MD jsd:cbb D: 10/22/2011 11:00:33 ET T: 10/22/2011 11:36:55 ET JOB#: [id number]  cc: Algernon Huxley, MD, <Dictator> Algernon Huxley MD ELECTRONICALLY SIGNED 10/22/2011 12:28

## 2014-09-14 ENCOUNTER — Ambulatory Visit (INDEPENDENT_AMBULATORY_CARE_PROVIDER_SITE_OTHER): Payer: Medicare Other | Admitting: *Deleted

## 2014-09-14 DIAGNOSIS — E538 Deficiency of other specified B group vitamins: Secondary | ICD-10-CM | POA: Diagnosis not present

## 2014-09-14 MED ORDER — CYANOCOBALAMIN 1000 MCG/ML IJ SOLN
1000.0000 ug | Freq: Once | INTRAMUSCULAR | Status: AC
  Administered 2014-09-14: 1000 ug via INTRAMUSCULAR

## 2014-09-15 DIAGNOSIS — H2513 Age-related nuclear cataract, bilateral: Secondary | ICD-10-CM | POA: Diagnosis not present

## 2014-09-15 DIAGNOSIS — H40053 Ocular hypertension, bilateral: Secondary | ICD-10-CM | POA: Diagnosis not present

## 2014-09-21 ENCOUNTER — Ambulatory Visit: Payer: Medicare Other

## 2014-09-25 ENCOUNTER — Ambulatory Visit (INDEPENDENT_AMBULATORY_CARE_PROVIDER_SITE_OTHER): Payer: Medicare Other | Admitting: Internal Medicine

## 2014-09-25 ENCOUNTER — Telehealth: Payer: Self-pay | Admitting: *Deleted

## 2014-09-25 ENCOUNTER — Encounter: Payer: Self-pay | Admitting: Internal Medicine

## 2014-09-25 VITALS — BP 142/76 | HR 64 | Temp 98.4°F | Ht 62.5 in | Wt 236.0 lb

## 2014-09-25 DIAGNOSIS — I10 Essential (primary) hypertension: Secondary | ICD-10-CM | POA: Diagnosis not present

## 2014-09-25 DIAGNOSIS — E785 Hyperlipidemia, unspecified: Secondary | ICD-10-CM | POA: Diagnosis not present

## 2014-09-25 DIAGNOSIS — E1169 Type 2 diabetes mellitus with other specified complication: Secondary | ICD-10-CM

## 2014-09-25 DIAGNOSIS — M25511 Pain in right shoulder: Secondary | ICD-10-CM | POA: Diagnosis not present

## 2014-09-25 DIAGNOSIS — E669 Obesity, unspecified: Secondary | ICD-10-CM

## 2014-09-25 DIAGNOSIS — E119 Type 2 diabetes mellitus without complications: Secondary | ICD-10-CM | POA: Diagnosis not present

## 2014-09-25 MED ORDER — ROSUVASTATIN CALCIUM 5 MG PO TABS: 5.0000 mg | ORAL_TABLET | Freq: Every day | ORAL | Status: DC

## 2014-09-25 NOTE — Assessment & Plan Note (Signed)
Right rotator cuff tear. Planning for surgical repair in the future. Continue prn Tylenol or Norco for severe pain.

## 2014-09-25 NOTE — Telephone Encounter (Signed)
Fax from pharmacy, needing PA for Rosuvastatin. Started online, pending response.

## 2014-09-25 NOTE — Progress Notes (Signed)
Subjective:    Patient ID: [name], female    DOB: 1945/09/25, 69 y.o.   MRN: [id number]  HPI  69YO female presents for follow up.  Last seen 08/15/2014. BG were elevated in setting of prednisone use for bronchitis.  Difficult time for her, caring for her husband.  DM - BG have been elevated, near 200s. Attributes to eating food at hospital while careing for husband. Compliant with medications.  Had laser eye surgery at [facility] for Glaucoma. Follow up pending.  Seen at [facility] for right shoulder pain. Had steroid injection in right neck. Also had MRI right shoulder which showed tear. Planning to have surgical repair once she is able to. Currently taking Norco for pain with some improvement.  Past medical, surgical, family and social history per today's encounter.  Review of Systems  Constitutional: Negative for fever, chills, appetite change, fatigue and unexpected weight change.  Eyes: Negative for visual disturbance.  Respiratory: Negative for shortness of breath.   Cardiovascular: Negative for chest pain and leg swelling.  Gastrointestinal: Negative for abdominal pain, diarrhea and constipation.  Musculoskeletal: Positive for myalgias and arthralgias.  Skin: Negative for color change and rash.  Hematological: Negative for adenopathy. Does not bruise/bleed easily.  Psychiatric/Behavioral: Negative for dysphoric mood. The patient is not nervous/anxious.        Objective:    BP 142/76 mmHg  Pulse 64  Temp(Src) 98.4 F (36.9 C) (Oral)  Ht 5' 2.5" (1.588 m)  Wt 236 lb (107.049 kg)  BMI 42.45 kg/m2  SpO2 98% Physical Exam  Constitutional: She is oriented to person, place, and time. She appears well-developed and well-nourished. No distress.  HENT:  Head: Normocephalic and atraumatic.  Right Ear: External ear normal.  Left Ear: External ear normal.  Nose: Nose normal.  Mouth/Throat: Oropharynx is clear and moist. No oropharyngeal exudate.  Eyes:  Conjunctivae are normal. Pupils are equal, round, and reactive to light. Right eye exhibits no discharge. Left eye exhibits no discharge. No scleral icterus.  Neck: Normal range of motion. Neck supple. No tracheal deviation present. No thyromegaly present.  Cardiovascular: Normal rate, regular rhythm, normal heart sounds and intact distal pulses.  Exam reveals no gallop and no friction rub.   No murmur heard. Pulmonary/Chest: Effort normal and breath sounds normal. No respiratory distress. She has no wheezes. She has no rales. She exhibits no tenderness.  Musculoskeletal: Normal range of motion. She exhibits no edema or tenderness.  Lymphadenopathy:    She has no cervical adenopathy.  Neurological: She is alert and oriented to person, place, and time. No cranial nerve deficit. She exhibits normal muscle tone. Coordination normal.  Skin: Skin is warm and dry. No rash noted. She is not diaphoretic. No erythema. No pallor.  Psychiatric: Her behavior is normal. Judgment and thought content normal. Her mood appears anxious.          Assessment & Plan:   Problem List Items Addressed This Visit      Unprioritized   Diabetes mellitus type 2 in obese - Primary    BG elevated. Will increase Levemir to 62units bid. Recheck A1c in 2 months.      Relevant Medications   rosuvastatin (CRESTOR) 5 MG tablet   Essential hypertension, benign    BP Readings from Last 3 Encounters:  09/25/14 142/76  08/15/14 135/80  07/03/14 168/98   BP slightly elevated, however has generally been well controlled. Will continue current medications. Renal function with next labs.  Relevant Medications   rosuvastatin (CRESTOR) 5 MG tablet   Hyperlipidemia    Lab Results  Component Value Date   LDLCALC 153* 05/09/2014   On Crestor. LDL poorly controlled last check. Will repeat labs including lipids and LFTs in 2 months.      Relevant Medications   rosuvastatin (CRESTOR) 5 MG tablet   Right shoulder pain     Right rotator cuff tear. Planning for surgical repair in the future. Continue prn Tylenol or Norco for severe pain.          Return in about 2 months (around 11/25/2014) for Recheck of Diabetes.

## 2014-09-25 NOTE — Assessment & Plan Note (Signed)
Lab Results  Component Value Date   LDLCALC 153* 05/09/2014   On Crestor. LDL poorly controlled last check. Will repeat labs including lipids and LFTs in 2 months.

## 2014-09-25 NOTE — Assessment & Plan Note (Signed)
BP Readings from Last 3 Encounters:  09/25/14 142/76  08/15/14 135/80  07/03/14 168/98   BP slightly elevated, however has generally been well controlled. Will continue current medications. Renal function with next labs.

## 2014-09-25 NOTE — Patient Instructions (Addendum)
Labs and follow up in 2 months.  Monitor blood sugars daily. If blood sugars staying above 200, then increase Levemir to 62 units twice daily.

## 2014-09-25 NOTE — Assessment & Plan Note (Signed)
BG elevated. Will increase Levemir to 62units bid. Recheck A1c in 2 months.

## 2014-09-25 NOTE — Progress Notes (Signed)
Pre visit review using our clinic review tool, if applicable. No additional management support is needed unless otherwise documented below in the visit note. 

## 2014-10-03 ENCOUNTER — Encounter: Payer: Self-pay | Admitting: Internal Medicine

## 2014-10-03 ENCOUNTER — Other Ambulatory Visit: Payer: Self-pay | Admitting: *Deleted

## 2014-10-03 IMAGING — CT CT ABD-PELV W/ CM
2 of 5 series · 17 of 46 positions shown, 19 images · IV contrast (isovue)
Comparison: CT 04/12/2009

CLINICAL DATA: Generalized abdominal pain, history of colon
resection bed

EXAM:
CT ABDOMEN AND PELVIS WITH CONTRAST
TECHNIQUE: Multidetector CT imaging of the abdomen and pelvis was performed
using the standard protocol following bolus administration of
intravenous contrast.
CONTRAST:  125 cc Isovue

[Series 2: routine abd pel with · axial · 0.88mm/px · z∈[+446,+856]mm · 14 of 94 slices shown, 16 images]
[im 6/94  soft-tissue]
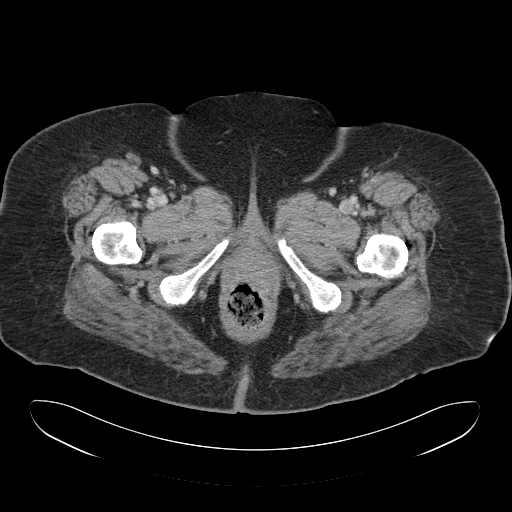
[im 6/94  bone]
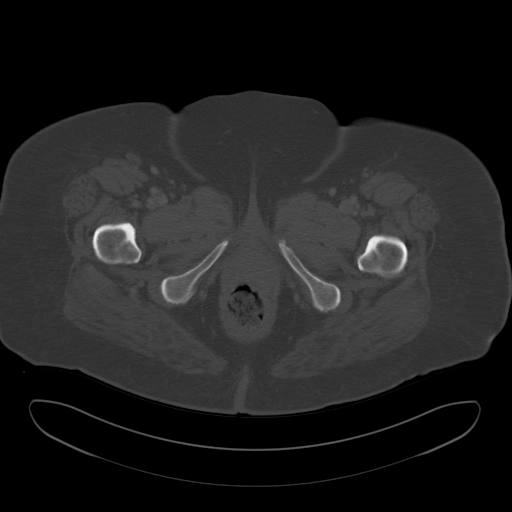
[im 11/94  soft-tissue]
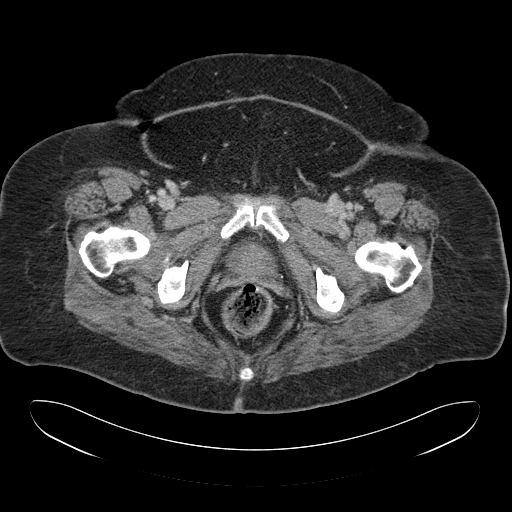
[im 21/94  soft-tissue]
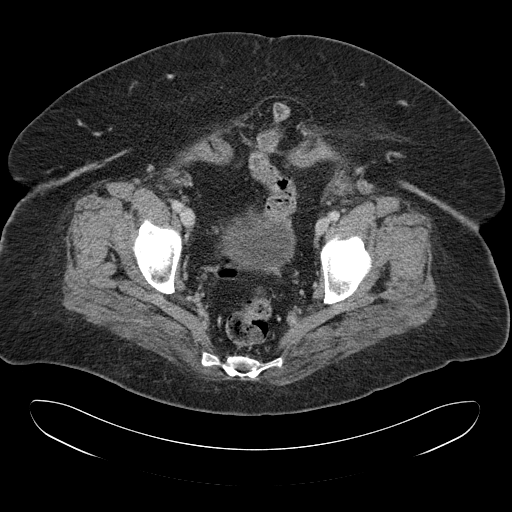
[im 26/94  soft-tissue]
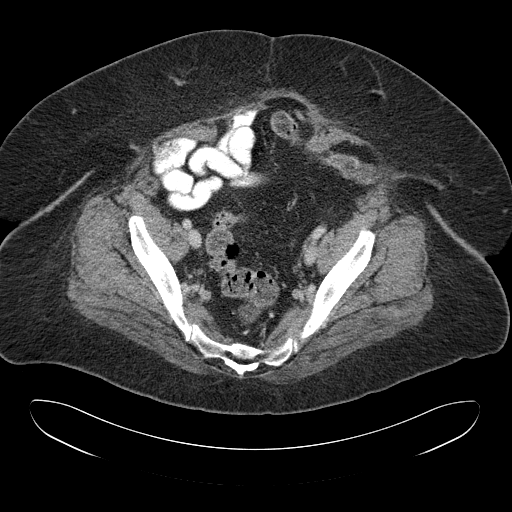
[im 32/94  soft-tissue]
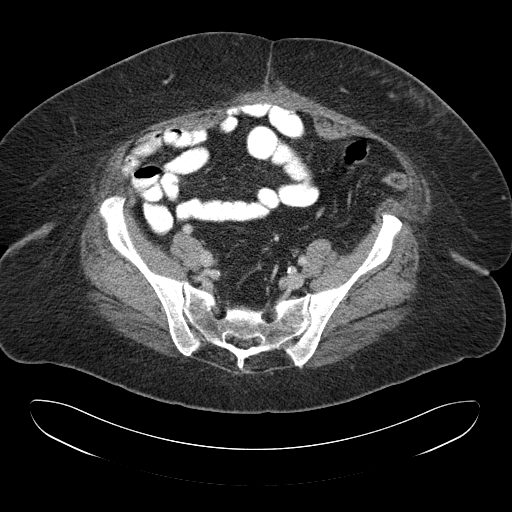
[im 37/94  soft-tissue]
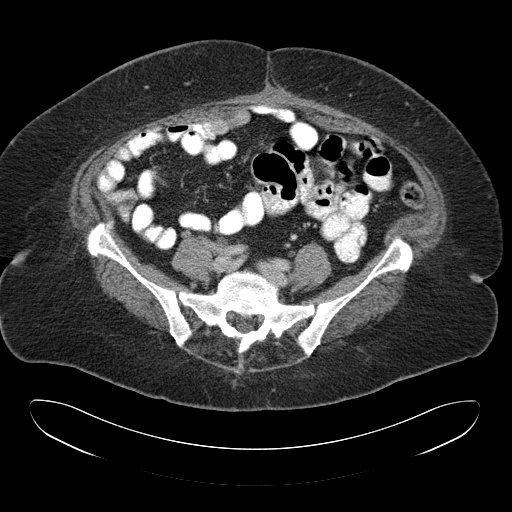
[im 42/94  soft-tissue]
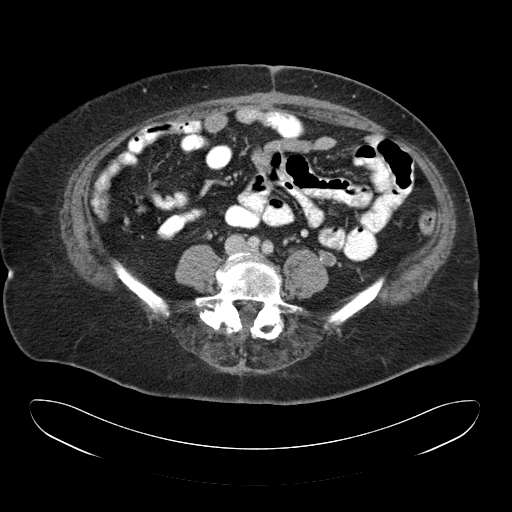
[im 52/94  soft-tissue]
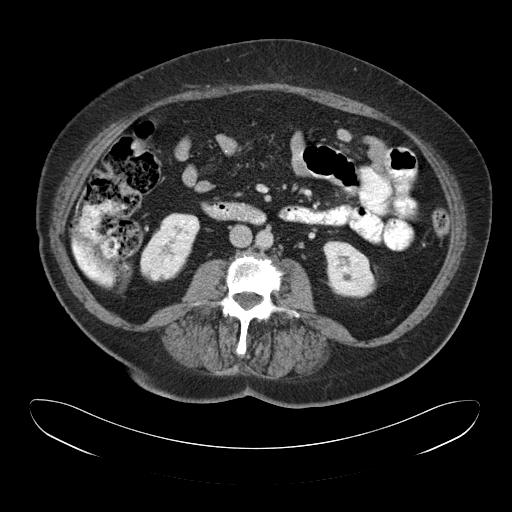
[im 57/94  soft-tissue]
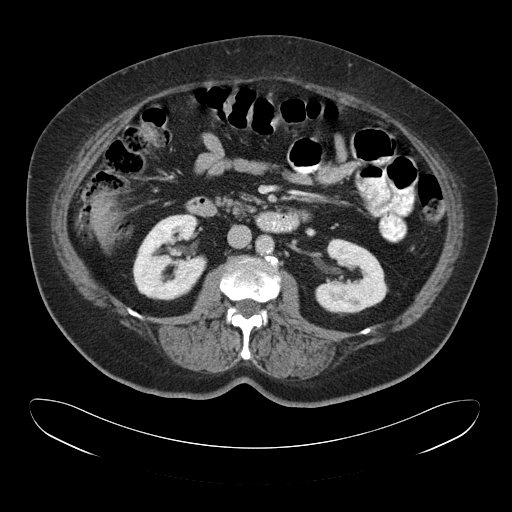
[im 57/94  bone]
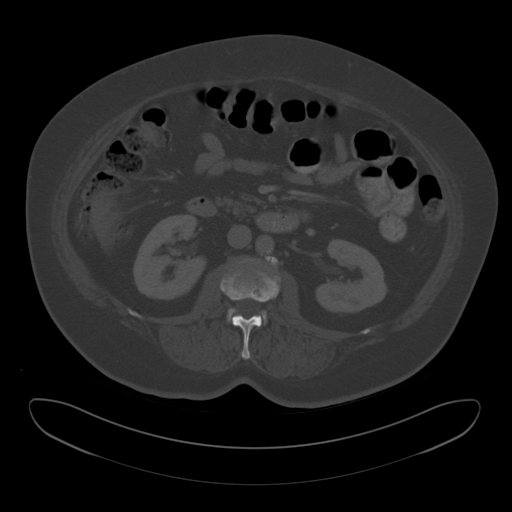
[im 63/94  soft-tissue]
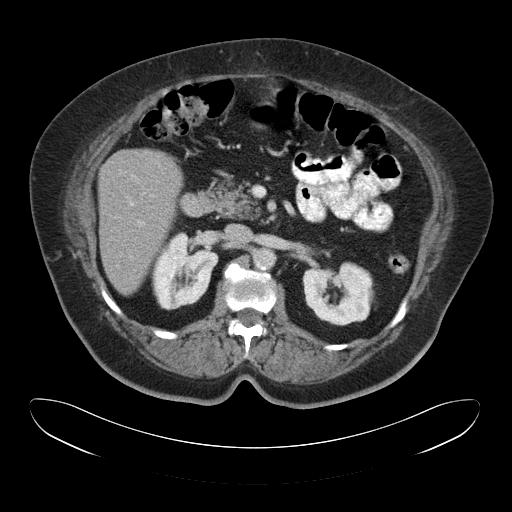
[im 68/94  soft-tissue]
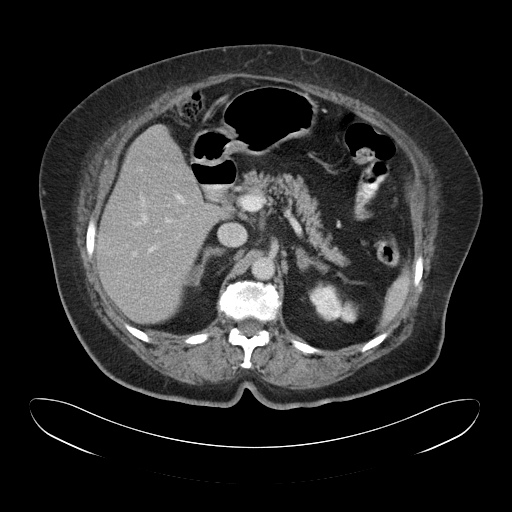
[im 73/94  soft-tissue]
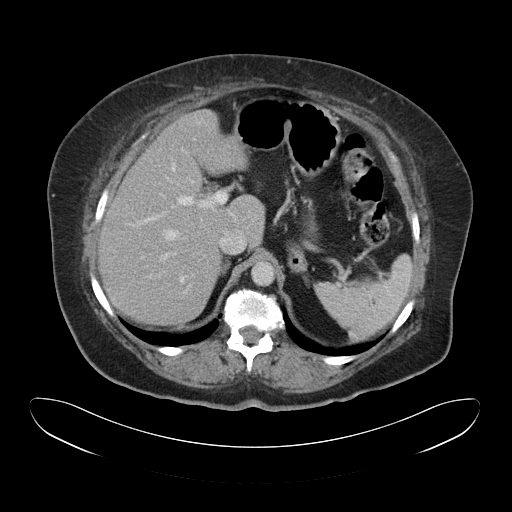
[im 83/94  soft-tissue]
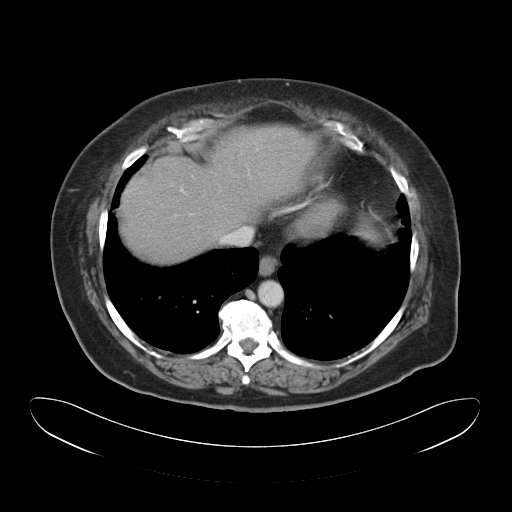
[im 88/94  soft-tissue]
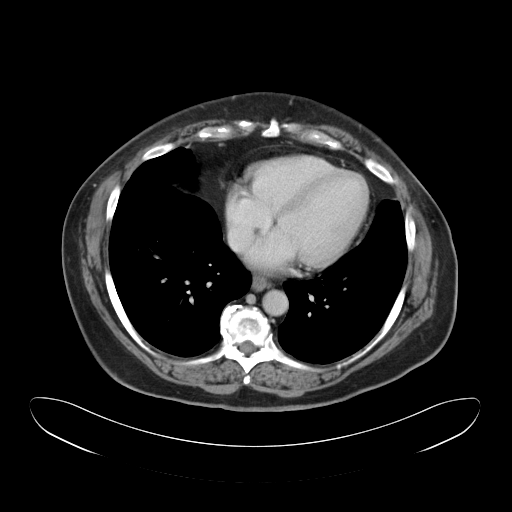

[Series 6: cor routine abd pel with · coronal · 0.85mm/px · 3 of 144 slices shown]
[im 48/144  soft-tissue]
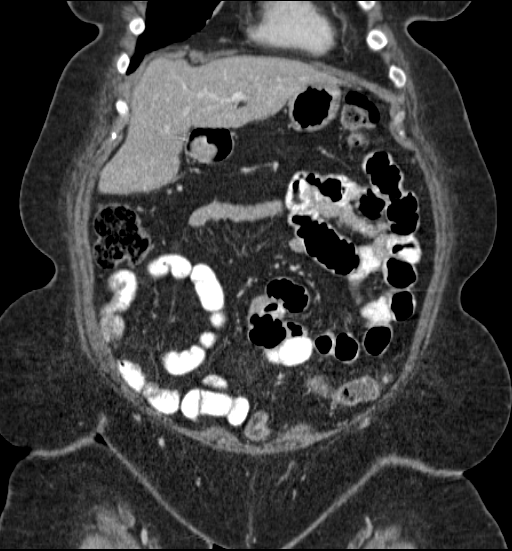
[im 64/144  soft-tissue]
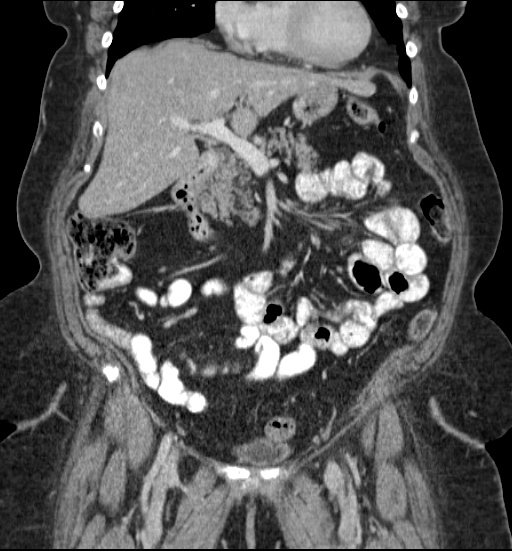
[im 80/144  soft-tissue]
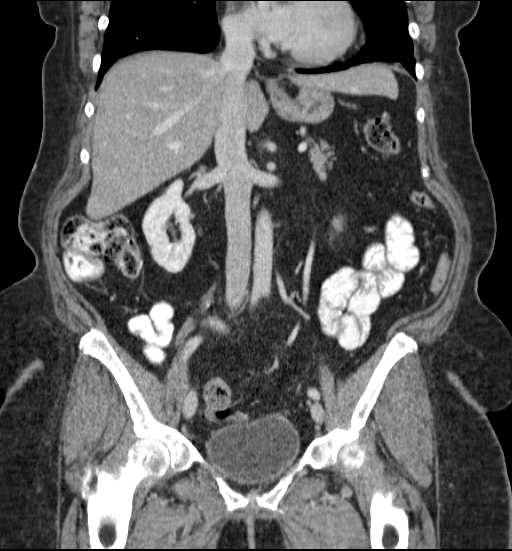

[17 of 46 positions shown; findings below may reference images not displayed]

FINDINGS: Lung bases are clear.  No pericardial fluid.

No focal hepatic lesion. Patient status post cholecystectomy. The
pancreas, spleen, adrenal glands, and kidneys are normal. There is
mild thickening of the adrenal glands which is felt to represent
benign hyperplasia and unchanged from prior.

Stomach, small bowel, and cecum are normal. The appendix is not
identified. There are surgical clips in the region of the cecum.
There is a ventral abdominal hernia midline below the umbilicus
which contains a short segment nonobstructive distal descending
colon (image 77). The sigmoid colon rectum are normal.

Abdominal or is normal caliber. No retroperitoneal periportal
lymphadenopathy.

No free fluid the pelvis. Post hysterectomy anatomy. No pelvic
lymphadenopathy.

There is a posterior lumbar surgery at L4-L5. Multiple levels of
disc osteophytic disease. No acute findings.
IMPRESSION: 1.  No acute abdominal or pelvic findings.

2. Midline ventral hernia beneath the umbilicus contains a short
segment of nonobstructed descending colon. This finding is similar
to comparison exam.

## 2014-10-03 MED ORDER — ATORVASTATIN CALCIUM 20 MG PO TABS: 20.0000 mg | ORAL_TABLET | Freq: Every day | ORAL | Status: DC

## 2014-11-19 ENCOUNTER — Ambulatory Visit
Admission: EM | Admit: 2014-11-19 | Discharge: 2014-11-19 | Disposition: A | Discharge status: Home / Self Care | Payer: Medicare Other | Attending: Family Medicine | Admitting: Family Medicine

## 2014-11-19 ENCOUNTER — Encounter: Payer: Self-pay | Admitting: Gynecology

## 2014-11-19 DIAGNOSIS — N390 Urinary tract infection, site not specified: Secondary | ICD-10-CM

## 2014-11-19 DIAGNOSIS — E785 Hyperlipidemia, unspecified: Secondary | ICD-10-CM | POA: Insufficient documentation

## 2014-11-19 DIAGNOSIS — Z86718 Personal history of other venous thrombosis and embolism: Secondary | ICD-10-CM | POA: Insufficient documentation

## 2014-11-19 DIAGNOSIS — E559 Vitamin D deficiency, unspecified: Secondary | ICD-10-CM | POA: Diagnosis not present

## 2014-11-19 DIAGNOSIS — Z86010 Personal history of colonic polyps: Secondary | ICD-10-CM | POA: Insufficient documentation

## 2014-11-19 DIAGNOSIS — E119 Type 2 diabetes mellitus without complications: Secondary | ICD-10-CM | POA: Diagnosis not present

## 2014-11-19 DIAGNOSIS — Z7982 Long term (current) use of aspirin: Secondary | ICD-10-CM | POA: Diagnosis not present

## 2014-11-19 DIAGNOSIS — M810 Age-related osteoporosis without current pathological fracture: Secondary | ICD-10-CM | POA: Diagnosis not present

## 2014-11-19 LAB — URINALYSIS COMPLETE WITH MICROSCOPIC (ARMC ONLY)
Bilirubin Urine: NEGATIVE
GLUCOSE, UA: NEGATIVE mg/dL
Nitrite: NEGATIVE
Protein, ur: NEGATIVE mg/dL
SPECIFIC GRAVITY, URINE: 1.03 (ref 1.005–1.030)
pH: 5 (ref 5.0–8.0)

## 2014-11-19 MED ORDER — CEFUROXIME AXETIL 250 MG PO TABS: 250.0000 mg | ORAL_TABLET | Freq: Two times a day (BID) | ORAL | Status: DC

## 2014-11-19 MED ORDER — PHENAZOPYRIDINE HCL 100 MG PO TABS: 200.0000 mg | ORAL_TABLET | Freq: Three times a day (TID) | ORAL | Status: DC | PRN

## 2014-11-19 NOTE — ED Notes (Signed)
Patient c/o frequent / burning and painful urination.

## 2014-11-19 NOTE — ED Provider Notes (Signed)
CSN: [id number]     Arrival date & time 11/19/14  1407 History   First MD Initiated Contact with Patient 11/19/14 1429     Chief Complaint  Patient presents with  . Urinary Tract Infection   (Consider location/radiation/quality/duration/timing/severity/associated sxs/prior Treatment) HPI Comments: Caucasian female has been worried about spouse hospitalized and transferred to nursing home recently thinks she got dehydrated started to having dysuria last night, burning, frequency, dribbling and felt hot/feverish so started tylenol, cranberry juice, apple cider vinegar without any relief of symptoms up all night now nausea also couldn't wait for PCM to open tomorrow  Last UTI many years ago was given ciprofloxacin.  Finger stick this am 92  Last dose tylenol 1130 today.  Headache and back pain chronic unchanged due to worries over sick spouse.  Noticed feet a little more swollen today than usual  Patient is a 69 y.o. female presenting with urinary tract infection. The history is provided by the patient.  Urinary Tract Infection Pain quality:  Burning Pain severity:  Moderate Onset quality:  Sudden Duration:  1 day Timing:  Constant Progression:  Worsening Chronicity:  New Recent urinary tract infections: no   Relieved by:  Nothing Ineffective treatments:  Acetaminophen and cranberry juice Urinary symptoms: frequent urination   Urinary symptoms: no discolored urine, no foul-smelling urine, no hematuria, no hesitancy and no bladder incontinence   Associated symptoms: abdominal pain, fever and nausea   Associated symptoms: no flank pain, no genital lesions, no vaginal discharge and no vomiting   Abdominal pain:    Location:  Generalized   Quality:  Aching   Severity:  Mild   Onset quality:  Sudden   Duration:  1 day   Timing:  Constant   Progression:  Unchanged   Chronicity:  New Fever:    Duration:  1 day   Temp source:  Subjective Nausea:    Severity:  Mild   Onset quality:   Sudden   Duration:  1 day   Timing:  Constant   Progression:  Unchanged Risk factors: no hx of pyelonephritis, no hx of urolithiasis, no kidney transplant, not pregnant, no recurrent urinary tract infections, no renal cysts, no renal disease, not sexually active, not single kidney, no sexually transmitted infections and no urinary catheter     Past Medical History  Diagnosis Date  . Foot fracture, right   . DVT (deep venous thrombosis)   . Diabetes mellitus   . History of back surgery 1980    X2  . Pneumonia   . Vitamin D deficiency   . Osteoporosis   . History of colon polyps   . Hyperlipidemia    Past Surgical History  Procedure Laterality Date  . Colectomy      for benign polyps  . Abdominal hysterectomy      total  . Arhtroscopy      left knee  . Cholecystectomy    . Appendectomy    . Cardiovascular stress test      abnormal, had cath which was normal  . Carpal tunnel release    . Cardiac catheterization      [facility]   . Glaucoma surgery      [facility]   Family History  Problem Relation Age of Onset  . Heart attack Father 37    MI  . Heart disease Father   . Arrhythmia Sister     pacemaker   History  Substance Use Topics  . Smoking status: Never Smoker   .  Smokeless tobacco: Never Used  . Alcohol Use: No   OB History    No data available     Review of Systems  Constitutional: Positive for fever. Negative for chills, diaphoresis, activity change, appetite change and fatigue.  HENT: Negative for congestion, facial swelling and trouble swallowing.   Eyes: Negative for photophobia, pain, discharge, redness, itching and visual disturbance.  Cardiovascular: Positive for leg swelling. Negative for chest pain and palpitations.  Gastrointestinal: Positive for nausea and abdominal pain. Negative for vomiting, diarrhea, constipation, blood in stool, abdominal distention and anal bleeding.  Endocrine: Negative for cold intolerance and heat intolerance.   Genitourinary: Positive for dysuria and frequency. Negative for hematuria, flank pain, decreased urine volume, vaginal discharge, enuresis, genital sores and menstrual problem.  Musculoskeletal: Positive for back pain. Negative for myalgias, joint swelling, arthralgias, gait problem, neck pain and neck stiffness.  Skin: Negative for color change, pallor, rash and wound.  Allergic/Immunologic: Negative for environmental allergies and food allergies.  Neurological: Positive for headaches. Negative for dizziness, tremors, seizures, syncope, facial asymmetry, speech difficulty, weakness, light-headedness and numbness.  Hematological: Negative for adenopathy. Does not bruise/bleed easily.  Psychiatric/Behavioral: Positive for sleep disturbance. Negative for behavioral problems, confusion and agitation.    Allergies  Tramadol; Buprenex; Iodinated diagnostic agents; Lisinopril; Losartan; Sulfa drugs cross reactors; and Ventolin  Home Medications   Prior to Admission medications   Medication Sig Start Date End Date Taking? Authorizing Provider  aspirin 325 MG tablet Take 325 mg by mouth daily.   Yes Historical Provider, MD  atorvastatin (LIPITOR) 20 MG tablet Take 1 tablet (20 mg total) by mouth daily. 10/03/14  Yes Jackolyn Confer, MD  cyanocobalamin (,VITAMIN B-12,) 1000 MCG/ML injection Inject 1,000 mcg into the muscle once.   Yes Historical Provider, MD  glucose blood (FREESTYLE LITE) test strip Check sugar 2-3 times daily Dx E10.69 04/19/14  Yes Jackolyn Confer, MD  Insulin Detemir (LEVEMIR FLEXTOUCH) 100 UNIT/ML Pen Inject 60 Units into the skin 2 (two) times daily. 04/19/14  Yes Jackolyn Confer, MD  magnesium oxide (MAG-OX) 400 MG tablet Take 1 tablet (400 mg total) by mouth 2 (two) times daily. 02/08/14  Yes Jackolyn Confer, MD  MECLIZINE HCL PO Take by mouth.   Yes Historical Provider, MD  meloxicam (MOBIC) 15 MG tablet Take 1 tablet (15 mg total) by mouth daily. 07/03/14  Yes Jackolyn Confer, MD  metFORMIN (GLUCOPHAGE) 1000 MG tablet Take 1 tablet (1,000 mg total) by mouth 2 (two) times daily with a meal. 04/19/14  Yes Jackolyn Confer, MD  nitroGLYCERIN (NITROSTAT) 0.4 MG SL tablet Place 1 tablet (0.4 mg total) under the tongue every 5 (five) minutes as needed for chest pain. 04/09/12  Yes Jackolyn Confer, MD  cefUROXime (CEFTIN) 250 MG tablet Take 1 tablet (250 mg total) by mouth 2 (two) times daily with a meal. 11/19/14   Olen Cordial, NP  phenazopyridine (PYRIDIUM) 100 MG tablet Take 2 tablets (200 mg total) by mouth 3 (three) times daily as needed for pain. 11/19/14   Aura Fey Betancourt, NP   BP 148/69 mmHg  Pulse 65  Temp(Src) 98.2 F (36.8 C) (Oral)  Ht 5\' 2"  (1.575 m)  Wt 230 lb (104.327 kg)  BMI 42.06 kg/m2  SpO2 99% Physical Exam  Constitutional: She is oriented to person, place, and time. Vital signs are normal. She appears well-developed and well-nourished. No distress.  HENT:  Head: Normocephalic and atraumatic.  Right Ear: External ear  normal.  Left Ear: External ear normal.  Nose: Nose normal.  Mouth/Throat: Oropharynx is clear and moist. No oropharyngeal exudate.  Eyes: Conjunctivae, EOM and lids are normal. Pupils are equal, round, and reactive to light. Right eye exhibits no discharge. Left eye exhibits no discharge. No scleral icterus.  Neck: Trachea normal and normal range of motion. Neck supple. No tracheal deviation present.  Cardiovascular: Normal rate, regular rhythm, normal heart sounds and intact distal pulses.  Exam reveals no gallop and no friction rub.   No murmur heard. Pulmonary/Chest: Effort normal and breath sounds normal. No stridor. No respiratory distress. She has no wheezes. She has no rales. She exhibits no tenderness.  Abdominal: Soft. Bowel sounds are normal. She exhibits no shifting dullness, no distension, no pulsatile liver, no fluid wave, no abdominal bruit, no ascites, no pulsatile midline mass and no mass. There is no  hepatosplenomegaly. There is tenderness in the right upper quadrant and left upper quadrant. There is no rebound, no guarding, no CVA tenderness, no tenderness at McBurney's point and negative Murphy's sign. Hernia confirmed negative in the ventral area.  Dull to percussion x 4 quads; RUQ/LUQ slightly TTP  Musculoskeletal: Normal range of motion. She exhibits no edema or tenderness.       Right foot: There is swelling.       Left foot: There is swelling.  Nonpitting edema bilateral feet 1-2+/4  Neurological: She is alert and oriented to person, place, and time.  Skin: Skin is warm, dry and intact. No rash noted. She is not diaphoretic. No erythema. No pallor.  Psychiatric: She has a normal mood and affect. Her speech is normal and behavior is normal. Judgment and thought content normal. Cognition and memory are normal.  Nursing note and vitals reviewed.   ED Course  Procedures (including critical care time) Labs Review Labs Reviewed  URINALYSIS COMPLETEWITH MICROSCOPIC ([facility] ONLY) - Abnormal; Notable for the following:    APPearance CLOUDY (*)    Ketones, ur TRACE (*)    Hgb urine dipstick 1+ (*)    Leukocytes, UA 1+ (*)    Bacteria, UA FEW (*)    Squamous Epithelial / LPF 0-5 (*)    All other components within normal limits  URINE CULTURE    Imaging Review No results found.   MDM   1. UTI (lower urinary tract infection)    Plan: 1. Test/x-ray results and diagnosis reviewed with patient copy of urinalysis results given to patient and reviewed with her.  Urine culture results will be available in 48 hours will call with results 2. rx as per orders; risks, benefits, potential side effects reviewed with patient 3. Recommend supportive treatment with pyridium, tylenol, fluids, rest 4. F/u prn if symptoms worsen or don't improve  Medications as directed.  Patient is also to push fluids and may use Pyridium 100mg  po TID as needed. Call or return to clinic as needed if these  symptoms worsen or fail to improve as anticipated.  Exitcare handout on cystitis given to patient Patient verbalized agreement and understanding of treatment plan and had no further questions at this time. P2:  Perfecto Kingdom, NP 11/19/14 1503

## 2014-11-19 NOTE — Discharge Instructions (Signed)

## 2014-11-21 LAB — URINE CULTURE

## 2014-11-23 ENCOUNTER — Telehealth: Payer: Self-pay | Admitting: *Deleted

## 2014-11-23 ENCOUNTER — Other Ambulatory Visit (INDEPENDENT_AMBULATORY_CARE_PROVIDER_SITE_OTHER): Payer: Medicare Other

## 2014-11-23 DIAGNOSIS — E119 Type 2 diabetes mellitus without complications: Secondary | ICD-10-CM

## 2014-11-23 LAB — URINALYSIS, ROUTINE W REFLEX MICROSCOPIC
Bilirubin Urine: NEGATIVE
Hgb urine dipstick: NEGATIVE
Ketones, ur: NEGATIVE
LEUKOCYTES UA: NEGATIVE
Nitrite: NEGATIVE
RBC / HPF: NONE SEEN (ref 0–?)
SPECIFIC GRAVITY, URINE: 1.02 (ref 1.000–1.030)
Total Protein, Urine: NEGATIVE
URINE GLUCOSE: NEGATIVE
Urobilinogen, UA: 0.2 (ref 0.0–1.0)
WBC, UA: NONE SEEN (ref 0–?)
pH: 6 (ref 5.0–8.0)

## 2014-11-23 LAB — COMPREHENSIVE METABOLIC PANEL
ALK PHOS: 55 U/L (ref 39–117)
ALT: 12 U/L (ref 0–35)
AST: 14 U/L (ref 0–37)
Albumin: 4.1 g/dL (ref 3.5–5.2)
BUN: 13 mg/dL (ref 6–23)
CO2: 31 mEq/L (ref 19–32)
CREATININE: 0.69 mg/dL (ref 0.40–1.20)
Calcium: 9.4 mg/dL (ref 8.4–10.5)
Chloride: 102 mEq/L (ref 96–112)
GFR: 89.63 mL/min (ref >60.00)
GLUCOSE: 119 mg/dL — AB (ref 70–99)
Potassium: 4.2 mEq/L (ref 3.5–5.1)
SODIUM: 141 meq/L (ref 135–145)
TOTAL PROTEIN: 7 g/dL (ref 6.0–8.3)
Total Bilirubin: 0.4 mg/dL (ref 0.2–1.2)

## 2014-11-23 LAB — MICROALBUMIN / CREATININE URINE RATIO
Creatinine,U: 118.3 mg/dL
MICROALB/CREAT RATIO: 4.1 mg/g (ref 0.0–30.0)
Microalb, Ur: 4.9 mg/dL — ABNORMAL HIGH (ref 0.0–1.9)

## 2014-11-23 LAB — HEMOGLOBIN A1C: Hgb A1c MFr Bld: 8.1 % — ABNORMAL HIGH (ref 4.6–6.5)

## 2014-11-23 NOTE — Telephone Encounter (Signed)
Pt left a urine sample ?

## 2014-11-23 NOTE — Telephone Encounter (Signed)
CMP and A1c for diabetes

## 2014-11-23 NOTE — Telephone Encounter (Signed)
OK. Fine to check UA and urine microalbumin

## 2014-11-23 NOTE — Telephone Encounter (Signed)
Labs and dX?  

## 2014-11-27 ENCOUNTER — Ambulatory Visit (INDEPENDENT_AMBULATORY_CARE_PROVIDER_SITE_OTHER): Payer: Medicare Other | Admitting: Internal Medicine

## 2014-11-27 ENCOUNTER — Encounter: Payer: Self-pay | Admitting: Internal Medicine

## 2014-11-27 VITALS — BP 124/69 | HR 71 | Temp 98.3°F | Ht 62.5 in | Wt 236.5 lb

## 2014-11-27 DIAGNOSIS — E119 Type 2 diabetes mellitus without complications: Secondary | ICD-10-CM

## 2014-11-27 DIAGNOSIS — E785 Hyperlipidemia, unspecified: Secondary | ICD-10-CM | POA: Diagnosis not present

## 2014-11-27 DIAGNOSIS — E669 Obesity, unspecified: Secondary | ICD-10-CM | POA: Diagnosis not present

## 2014-11-27 DIAGNOSIS — I10 Essential (primary) hypertension: Secondary | ICD-10-CM | POA: Diagnosis not present

## 2014-11-27 DIAGNOSIS — E1169 Type 2 diabetes mellitus with other specified complication: Secondary | ICD-10-CM

## 2014-11-27 NOTE — Assessment & Plan Note (Signed)
Wt Readings from Last 3 Encounters:  11/27/14 236 lb 8 oz (107.276 kg)  11/19/14 230 lb (104.327 kg)  09/25/14 236 lb (107.049 kg)   Body mass index is 42.54 kg/(m^2). The patient is asked to make an attempt to improve diet and exercise patterns to aid in medical management of this problem.

## 2014-11-27 NOTE — Assessment & Plan Note (Signed)
LFTS normal on recent check. Continue Atorvastatin.

## 2014-11-27 NOTE — Progress Notes (Signed)
Pre visit review using our clinic review tool, if applicable. No additional management support is needed unless otherwise documented below in the visit note. 

## 2014-11-27 NOTE — Assessment & Plan Note (Signed)
BP Readings from Last 3 Encounters:  11/27/14 124/69  11/19/14 148/69  09/25/14 142/76   Not on ACEi because of hypotension.

## 2014-11-27 NOTE — Assessment & Plan Note (Signed)
Increase Levemir to 65units in the morning. Continue Levemir 60units at night. Continue Metformin. Encouraged healthy diet.

## 2014-11-27 NOTE — Patient Instructions (Signed)
Increase Levemir to 65units in the morning and stay at 60units at night.  Follow up in 3 months and sooner as needed.

## 2014-11-27 NOTE — Progress Notes (Addendum)
Subjective:    Patient ID: [name], female    DOB: 11/17/1945, 69 y.o.   MRN: [id number]  HPI  69YO female presents for follow up.  DM - BG up recently. Some dietary indiscretion with caring for husband. Lab Results  Component Value Date   HGBA1C 8.1* 11/23/2014   Compliant with medications.  Recently treated for UTI with Ceftin. Continues to have some back pain and burning, however improved. No fever, chills, flank pain.   Past medical, surgical, family and social history per today's encounter.  Review of Systems  Constitutional: Negative for fever, chills, appetite change, fatigue and unexpected weight change.  Eyes: Negative for visual disturbance.  Respiratory: Negative for shortness of breath.   Cardiovascular: Negative for chest pain and leg swelling.  Gastrointestinal: Negative for nausea, vomiting, abdominal pain, diarrhea and constipation.  Genitourinary: Positive for dysuria and frequency. Negative for urgency and flank pain.  Musculoskeletal: Positive for myalgias, back pain and arthralgias.  Skin: Negative for color change and rash.  Hematological: Negative for adenopathy. Does not bruise/bleed easily.  Psychiatric/Behavioral: Negative for sleep disturbance and dysphoric mood. The patient is not nervous/anxious.        Objective:    BP 124/69 mmHg  Pulse 71  Temp(Src) 98.3 F (36.8 C) (Oral)  Ht 5' 2.5" (1.588 m)  Wt 236 lb 8 oz (107.276 kg)  BMI 42.54 kg/m2  SpO2 98% Physical Exam  Constitutional: She is oriented to person, place, and time. She appears well-developed and well-nourished. No distress.  HENT:  Head: Normocephalic and atraumatic.  Right Ear: External ear normal.  Left Ear: External ear normal.  Nose: Nose normal.  Mouth/Throat: Oropharynx is clear and moist. No oropharyngeal exudate.  Eyes: Conjunctivae are normal. Pupils are equal, round, and reactive to light. Right eye exhibits no discharge. Left eye exhibits no discharge.  No scleral icterus.  Neck: Normal range of motion. Neck supple. No tracheal deviation present. No thyromegaly present.  Cardiovascular: Normal rate, regular rhythm, normal heart sounds and intact distal pulses.  Exam reveals no gallop and no friction rub.   No murmur heard. Pulmonary/Chest: Effort normal and breath sounds normal. No respiratory distress. She has no wheezes. She has no rales. She exhibits no tenderness.  Musculoskeletal: Normal range of motion. She exhibits no edema or tenderness.  Lymphadenopathy:    She has no cervical adenopathy.  Neurological: She is alert and oriented to person, place, and time. No cranial nerve deficit. She exhibits normal muscle tone. Coordination normal.  Skin: Skin is warm and dry. No rash noted. She is not diaphoretic. No erythema. No pallor.  Psychiatric: She has a normal mood and affect. Her behavior is normal. Judgment and thought content normal.          Assessment & Plan:   Problem List Items Addressed This Visit      Unprioritized   Diabetes mellitus type 2 in obese - Primary    Increase Levemir to 65units in the morning. Continue Levemir 60units at night. Continue Metformin. Encouraged healthy diet.      Essential hypertension, benign    BP Readings from Last 3 Encounters:  11/27/14 124/69  11/19/14 148/69  09/25/14 142/76   Not on ACEi because of hypotension.      Hyperlipidemia    LFTS normal on recent check. Continue Atorvastatin.      Morbid obesity    Wt Readings from Last 3 Encounters:  11/27/14 236 lb 8 oz (107.276 kg)  11/19/14 230  lb (104.327 kg)  09/25/14 236 lb (107.049 kg)   Body mass index is 42.54 kg/(m^2). The patient is asked to make an attempt to improve diet and exercise patterns to aid in medical management of this problem.           Return in about 3 months (around 02/27/2015) for Recheck of Diabetes.

## 2014-11-28 DIAGNOSIS — N390 Urinary tract infection, site not specified: Secondary | ICD-10-CM | POA: Diagnosis not present

## 2014-11-28 DIAGNOSIS — R109 Unspecified abdominal pain: Secondary | ICD-10-CM | POA: Diagnosis not present

## 2014-11-28 DIAGNOSIS — N3001 Acute cystitis with hematuria: Secondary | ICD-10-CM | POA: Diagnosis not present

## 2015-01-23 DIAGNOSIS — H40003 Preglaucoma, unspecified, bilateral: Secondary | ICD-10-CM | POA: Diagnosis not present

## 2015-01-23 DIAGNOSIS — E119 Type 2 diabetes mellitus without complications: Secondary | ICD-10-CM | POA: Diagnosis not present

## 2015-01-23 DIAGNOSIS — H2513 Age-related nuclear cataract, bilateral: Secondary | ICD-10-CM | POA: Diagnosis not present

## 2015-01-23 DIAGNOSIS — H40053 Ocular hypertension, bilateral: Secondary | ICD-10-CM | POA: Diagnosis not present

## 2015-02-01 ENCOUNTER — Ambulatory Visit (INDEPENDENT_AMBULATORY_CARE_PROVIDER_SITE_OTHER): Payer: Medicare Other | Admitting: *Deleted

## 2015-02-01 DIAGNOSIS — E538 Deficiency of other specified B group vitamins: Secondary | ICD-10-CM

## 2015-02-01 DIAGNOSIS — Z23 Encounter for immunization: Secondary | ICD-10-CM

## 2015-02-01 MED ORDER — CYANOCOBALAMIN 1000 MCG/ML IJ SOLN
1000.0000 ug | Freq: Once | INTRAMUSCULAR | Status: AC
  Administered 2015-02-01: 1000 ug via INTRAMUSCULAR

## 2015-02-07 ENCOUNTER — Encounter: Payer: Self-pay | Admitting: Internal Medicine

## 2015-02-07 ENCOUNTER — Other Ambulatory Visit: Payer: Self-pay

## 2015-02-07 MED ORDER — GLUCOSE BLOOD VI STRP: ORAL_STRIP | Status: AC

## 2015-02-09 ENCOUNTER — Other Ambulatory Visit: Payer: Self-pay

## 2015-02-09 ENCOUNTER — Encounter: Payer: Self-pay | Admitting: Internal Medicine

## 2015-02-09 DIAGNOSIS — E108 Type 1 diabetes mellitus with unspecified complications: Principal | ICD-10-CM

## 2015-02-09 DIAGNOSIS — IMO0002 Reserved for concepts with insufficient information to code with codable children: Secondary | ICD-10-CM

## 2015-02-09 DIAGNOSIS — E1065 Type 1 diabetes mellitus with hyperglycemia: Secondary | ICD-10-CM

## 2015-02-09 MED ORDER — INSULIN DETEMIR 100 UNIT/ML FLEXPEN: 60.0000 [IU] | PEN_INJECTOR | Freq: Two times a day (BID) | SUBCUTANEOUS | Status: DC

## 2015-02-09 NOTE — Telephone Encounter (Signed)
Pt asked for: [facility] for 10 boxes of levemir pre filled pens for three month supply. 60 units in am and 65 units in pm. Is this okay?

## 2015-02-15 ENCOUNTER — Telehealth: Payer: Self-pay | Admitting: Internal Medicine

## 2015-02-15 NOTE — Telephone Encounter (Signed)
Called pharmacy, completed.

## 2015-02-15 NOTE — Telephone Encounter (Signed)
Pt called about her prescription Insulin Detemir (LEVEMIR FLEXTOUCH) 100 UNIT/ML Pen which was called into the pharmacy incorrectly. Pt says it should be 60 units am and 65 units pm for 90 day. Pt states pharmacy says it can be called in correctly and it will back out the other prescription. Thank You!

## 2015-02-17 ENCOUNTER — Encounter: Payer: Self-pay | Admitting: Internal Medicine

## 2015-02-19 ENCOUNTER — Telehealth: Payer: Self-pay | Admitting: *Deleted

## 2015-02-19 NOTE — Telephone Encounter (Signed)
Called pt to discuss her concerns about medication refill.   Pt requested to be seen by Dr Gilford Rile tomorrow for chest tightness.  Per Dr Gilford Rile pt needs to be seen today, gave pt several options to be seen today by Dr Lacinda Axon or Dr Josephina Gip, Pt refused to be seen today as her husband has an appt this afternoon.  Pt stated that she feels she is having anxiety and if any problems occur between now and tomorrow morning she will be seen at Urgent Care.

## 2015-02-20 ENCOUNTER — Telehealth: Payer: Self-pay | Admitting: Internal Medicine

## 2015-02-20 ENCOUNTER — Ambulatory Visit (INDEPENDENT_AMBULATORY_CARE_PROVIDER_SITE_OTHER): Payer: Medicare Other | Admitting: Internal Medicine

## 2015-02-20 ENCOUNTER — Encounter: Payer: Self-pay | Admitting: Internal Medicine

## 2015-02-20 VITALS — BP 165/80 | HR 63 | Temp 98.4°F | Ht 62.5 in | Wt 232.2 lb

## 2015-02-20 DIAGNOSIS — I25110 Atherosclerotic heart disease of native coronary artery with unstable angina pectoris: Secondary | ICD-10-CM | POA: Diagnosis present

## 2015-02-20 DIAGNOSIS — Z794 Long term (current) use of insulin: Secondary | ICD-10-CM | POA: Diagnosis not present

## 2015-02-20 DIAGNOSIS — Z87442 Personal history of urinary calculi: Secondary | ICD-10-CM | POA: Diagnosis not present

## 2015-02-20 DIAGNOSIS — Z6841 Body Mass Index (BMI) 40.0 and over, adult: Secondary | ICD-10-CM | POA: Diagnosis not present

## 2015-02-20 DIAGNOSIS — E119 Type 2 diabetes mellitus without complications: Secondary | ICD-10-CM | POA: Diagnosis not present

## 2015-02-20 DIAGNOSIS — I2510 Atherosclerotic heart disease of native coronary artery without angina pectoris: Secondary | ICD-10-CM | POA: Diagnosis not present

## 2015-02-20 DIAGNOSIS — Z9049 Acquired absence of other specified parts of digestive tract: Secondary | ICD-10-CM | POA: Diagnosis not present

## 2015-02-20 DIAGNOSIS — E785 Hyperlipidemia, unspecified: Secondary | ICD-10-CM | POA: Diagnosis not present

## 2015-02-20 DIAGNOSIS — Z87440 Personal history of urinary (tract) infections: Secondary | ICD-10-CM | POA: Diagnosis not present

## 2015-02-20 DIAGNOSIS — R079 Chest pain, unspecified: Secondary | ICD-10-CM | POA: Insufficient documentation

## 2015-02-20 DIAGNOSIS — R110 Nausea: Secondary | ICD-10-CM | POA: Diagnosis not present

## 2015-02-20 DIAGNOSIS — R1013 Epigastric pain: Secondary | ICD-10-CM | POA: Diagnosis not present

## 2015-02-20 DIAGNOSIS — R0789 Other chest pain: Secondary | ICD-10-CM | POA: Diagnosis not present

## 2015-02-20 DIAGNOSIS — Z90710 Acquired absence of both cervix and uterus: Secondary | ICD-10-CM | POA: Diagnosis not present

## 2015-02-20 DIAGNOSIS — Z8249 Family history of ischemic heart disease and other diseases of the circulatory system: Secondary | ICD-10-CM | POA: Diagnosis not present

## 2015-02-20 DIAGNOSIS — Z823 Family history of stroke: Secondary | ICD-10-CM | POA: Diagnosis not present

## 2015-02-20 DIAGNOSIS — R0602 Shortness of breath: Secondary | ICD-10-CM | POA: Diagnosis not present

## 2015-02-20 DIAGNOSIS — E669 Obesity, unspecified: Secondary | ICD-10-CM | POA: Diagnosis present

## 2015-02-20 NOTE — Telephone Encounter (Signed)
I sent her to the ER this morning. She never checked in. Did she not go? Did she go to another outside hospital? Can you call to check on her.

## 2015-02-20 NOTE — Progress Notes (Signed)
Pre visit review using our clinic review tool, if applicable. No additional management support is needed unless otherwise documented below in the visit note. 

## 2015-02-20 NOTE — Patient Instructions (Signed)
To ER for evaluation of Chest Pain.

## 2015-02-20 NOTE — Assessment & Plan Note (Signed)
Left sided chest pain, worsening, most c/w unstable angina. EKG showed non-specific changes. She is high risk for CAD with known 70% distal LAD lesion on previous cath, history of DM, HL. Recommended ED evaluation and EMS transport to ED, she declined. She will drive herself to ED. Left message with her cardiologist. Follow up here after evaluation complete.

## 2015-02-20 NOTE — Progress Notes (Signed)
Subjective:    Patient ID: [name], female    DOB: 1945/05/20, 69 y.o.   MRN: [id number]  HPI  69YO female presents for acute visit.  Chest pain - Left sided chest pain over last few days, becoming steadier. Radiates to left shoulder. Has pain at present, both at rest and on exertion. Described as tightness. Feels short of breath with walking. Unable to walk on treadmill yesterday. No nausea or diaphoresis. Took aspirin 325mg  yesterday. Has not taken NTG. Cath 2012 showed 70% distal LAD lesion.  Wt Readings from Last 3 Encounters:  02/20/15 232 lb 4 oz (105.348 kg)  11/27/14 236 lb 8 oz (107.276 kg)  11/19/14 230 lb (104.327 kg)   BP Readings from Last 3 Encounters:  02/20/15 165/80  11/27/14 124/69  11/19/14 148/69    Past Medical History  Diagnosis Date  . Foot fracture, right   . DVT (deep venous thrombosis) ([place])   . Diabetes mellitus   . History of back surgery 1980    X2  . Pneumonia   . Vitamin D deficiency   . Osteoporosis   . History of colon polyps   . Hyperlipidemia    Family History  Problem Relation Age of Onset  . Heart attack Father 11    MI  . Heart disease Father   . Arrhythmia Sister     pacemaker   Past Surgical History  Procedure Laterality Date  . Colectomy      for benign polyps  . Abdominal hysterectomy      total  . Arhtroscopy      left knee  . Cholecystectomy    . Appendectomy    . Cardiovascular stress test      abnormal, had cath which was normal  . Carpal tunnel release    . Cardiac catheterization      [facility]   . Glaucoma surgery      [facility]   Social History   Social History  . Marital Status: Married    Spouse Name: N/A  . Number of Children: N/A  . Years of Education: N/A   Social History Main Topics  . Smoking status: Never Smoker   . Smokeless tobacco: Never Used  . Alcohol Use: No  . Drug Use: No  . Sexual Activity: Not Asked   Other Topics Concern  . None   Social History Narrative      Review of Systems  Constitutional: Positive for fatigue. Negative for fever, chills, appetite change and unexpected weight change.  Eyes: Negative for visual disturbance.  Respiratory: Positive for chest tightness. Negative for cough, shortness of breath and wheezing.   Cardiovascular: Positive for chest pain. Negative for palpitations and leg swelling.  Gastrointestinal: Negative for nausea, abdominal pain, diarrhea and constipation.  Skin: Negative for color change and rash.  Hematological: Negative for adenopathy. Does not bruise/bleed easily.  Psychiatric/Behavioral: Positive for dysphoric mood. Negative for sleep disturbance. The patient is nervous/anxious.        Objective:    BP 165/80 mmHg  Pulse 63  Temp(Src) 98.4 F (36.9 C) (Oral)  Ht 5' 2.5" (1.588 m)  Wt 232 lb 4 oz (105.348 kg)  BMI 41.78 kg/m2  SpO2 97% Physical Exam  Constitutional: She is oriented to person, place, and time. She appears well-developed and well-nourished. No distress.  HENT:  Head: Normocephalic and atraumatic.  Right Ear: External ear normal.  Left Ear: External ear normal.  Nose: Nose normal.  Mouth/Throat: Oropharynx is  clear and moist.  Eyes: Conjunctivae are normal. Pupils are equal, round, and reactive to light. Right eye exhibits no discharge. Left eye exhibits no discharge. No scleral icterus.  Neck: Normal range of motion. Neck supple. No tracheal deviation present. No thyromegaly present.  Cardiovascular: Normal rate, regular rhythm, normal heart sounds and intact distal pulses.  Exam reveals no gallop and no friction rub.   No murmur heard. Pulmonary/Chest: Effort normal and breath sounds normal. No respiratory distress. She has no wheezes. She has no rales. She exhibits no tenderness.  Musculoskeletal: Normal range of motion. She exhibits no edema or tenderness.  Lymphadenopathy:    She has no cervical adenopathy.  Neurological: She is alert and oriented to person, place, and  time. No cranial nerve deficit. She exhibits normal muscle tone. Coordination normal.  Skin: Skin is warm and dry. No rash noted. She is not diaphoretic. No erythema. No pallor.  Psychiatric: She has a normal mood and affect. Her behavior is normal. Judgment and thought content normal.          Assessment & Plan:   Problem List Items Addressed This Visit      Unprioritized   Chest pain - Primary    Left sided chest pain, worsening, most c/w unstable angina. EKG showed non-specific changes. She is high risk for CAD with known 70% distal LAD lesion on previous cath, history of DM, HL. Recommended ED evaluation and EMS transport to ED, she declined. She will drive herself to ED. Left message with her cardiologist. Follow up here after evaluation complete.      Relevant Orders   EKG 12-Lead (Completed)       Return if symptoms worsen or fail to improve.

## 2015-02-21 DIAGNOSIS — Z794 Long term (current) use of insulin: Secondary | ICD-10-CM | POA: Diagnosis not present

## 2015-02-21 DIAGNOSIS — I25110 Atherosclerotic heart disease of native coronary artery with unstable angina pectoris: Secondary | ICD-10-CM | POA: Diagnosis not present

## 2015-02-21 DIAGNOSIS — E119 Type 2 diabetes mellitus without complications: Secondary | ICD-10-CM | POA: Diagnosis not present

## 2015-02-21 DIAGNOSIS — R079 Chest pain, unspecified: Secondary | ICD-10-CM | POA: Diagnosis not present

## 2015-02-21 DIAGNOSIS — Z6841 Body Mass Index (BMI) 40.0 and over, adult: Secondary | ICD-10-CM | POA: Diagnosis not present

## 2015-02-21 DIAGNOSIS — E785 Hyperlipidemia, unspecified: Secondary | ICD-10-CM | POA: Diagnosis not present

## 2015-02-21 DIAGNOSIS — Z87442 Personal history of urinary calculi: Secondary | ICD-10-CM | POA: Diagnosis not present

## 2015-02-28 ENCOUNTER — Telehealth: Payer: Self-pay | Admitting: *Deleted

## 2015-02-28 ENCOUNTER — Other Ambulatory Visit (INDEPENDENT_AMBULATORY_CARE_PROVIDER_SITE_OTHER): Payer: Medicare Other

## 2015-02-28 DIAGNOSIS — E119 Type 2 diabetes mellitus without complications: Secondary | ICD-10-CM

## 2015-02-28 LAB — COMPREHENSIVE METABOLIC PANEL
ALBUMIN: 3.9 g/dL (ref 3.5–5.2)
ALT: 12 U/L (ref 0–35)
AST: 14 U/L (ref 0–37)
Alkaline Phosphatase: 52 U/L (ref 39–117)
BUN: 12 mg/dL (ref 6–23)
CO2: 29 mEq/L (ref 19–32)
Calcium: 9.6 mg/dL (ref 8.4–10.5)
Chloride: 103 mEq/L (ref 96–112)
Creatinine, Ser: 0.65 mg/dL (ref 0.40–1.20)
GFR: 95.95 mL/min (ref >60.00)
Glucose, Bld: 140 mg/dL — ABNORMAL HIGH (ref 70–99)
POTASSIUM: 4.3 meq/L (ref 3.5–5.1)
Sodium: 141 mEq/L (ref 135–145)
TOTAL PROTEIN: 6.8 g/dL (ref 6.0–8.3)
Total Bilirubin: 0.3 mg/dL (ref 0.2–1.2)

## 2015-02-28 LAB — HEMOGLOBIN A1C: HEMOGLOBIN A1C: 7.8 % — AB (ref 4.6–6.5)

## 2015-02-28 LAB — LIPID PANEL
CHOLESTEROL: 140 mg/dL (ref 0–200)
HDL: 56.5 mg/dL (ref >39.00)
LDL Cholesterol: 65 mg/dL (ref 0–99)
NONHDL: 83.91
Total CHOL/HDL Ratio: 2
Triglycerides: 97 mg/dL (ref 0.0–149.0)
VLDL: 19.4 mg/dL (ref 0.0–40.0)

## 2015-02-28 NOTE — Telephone Encounter (Signed)
Labs and dX?  

## 2015-02-28 NOTE — Telephone Encounter (Signed)
CMP, A1c , lipids for diabetes 

## 2015-03-01 ENCOUNTER — Ambulatory Visit: Payer: Medicare Other | Admitting: Internal Medicine

## 2015-03-07 DIAGNOSIS — Z86718 Personal history of other venous thrombosis and embolism: Secondary | ICD-10-CM | POA: Diagnosis not present

## 2015-03-07 DIAGNOSIS — Z794 Long term (current) use of insulin: Secondary | ICD-10-CM | POA: Diagnosis not present

## 2015-03-07 DIAGNOSIS — M79604 Pain in right leg: Secondary | ICD-10-CM | POA: Diagnosis not present

## 2015-03-07 DIAGNOSIS — M1711 Unilateral primary osteoarthritis, right knee: Secondary | ICD-10-CM | POA: Diagnosis not present

## 2015-03-07 DIAGNOSIS — Z79899 Other long term (current) drug therapy: Secondary | ICD-10-CM | POA: Diagnosis not present

## 2015-03-07 DIAGNOSIS — E119 Type 2 diabetes mellitus without complications: Secondary | ICD-10-CM | POA: Diagnosis not present

## 2015-03-07 DIAGNOSIS — M79661 Pain in right lower leg: Secondary | ICD-10-CM | POA: Diagnosis not present

## 2015-03-09 ENCOUNTER — Ambulatory Visit (INDEPENDENT_AMBULATORY_CARE_PROVIDER_SITE_OTHER): Payer: Medicare Other | Admitting: Internal Medicine

## 2015-03-09 ENCOUNTER — Encounter: Payer: Self-pay | Admitting: Internal Medicine

## 2015-03-09 VITALS — BP 143/69 | HR 72 | Temp 98.2°F | Ht 62.5 in | Wt 232.5 lb

## 2015-03-09 DIAGNOSIS — E785 Hyperlipidemia, unspecified: Secondary | ICD-10-CM | POA: Diagnosis not present

## 2015-03-09 DIAGNOSIS — E1169 Type 2 diabetes mellitus with other specified complication: Secondary | ICD-10-CM

## 2015-03-09 DIAGNOSIS — E669 Obesity, unspecified: Secondary | ICD-10-CM | POA: Diagnosis not present

## 2015-03-09 DIAGNOSIS — E119 Type 2 diabetes mellitus without complications: Secondary | ICD-10-CM | POA: Diagnosis not present

## 2015-03-09 DIAGNOSIS — E538 Deficiency of other specified B group vitamins: Secondary | ICD-10-CM

## 2015-03-09 DIAGNOSIS — M79604 Pain in right leg: Secondary | ICD-10-CM

## 2015-03-09 DIAGNOSIS — I2510 Atherosclerotic heart disease of native coronary artery without angina pectoris: Secondary | ICD-10-CM

## 2015-03-09 DIAGNOSIS — I10 Essential (primary) hypertension: Secondary | ICD-10-CM | POA: Diagnosis not present

## 2015-03-09 DIAGNOSIS — I2583 Coronary atherosclerosis due to lipid rich plaque: Secondary | ICD-10-CM

## 2015-03-09 MED ORDER — CYANOCOBALAMIN 1000 MCG/ML IJ SOLN
1000.0000 ug | Freq: Once | INTRAMUSCULAR | Status: AC
  Administered 2015-03-09: 1000 ug via INTRAMUSCULAR

## 2015-03-09 NOTE — Assessment & Plan Note (Signed)
Lab Results  Component Value Date   HGBA1C 7.8* 02/28/2015   BG generally well controlled. Continue Metformin and Levemir.

## 2015-03-09 NOTE — Progress Notes (Signed)
Subjective:    Patient ID: [name], female    DOB: 06/02/1945, 69 y.o.   MRN: [id number]  HPI  69YO female presents for follow up.  DM - Recent A1c 7.8%.  Compliant with medication.  CAD - Recently found to have 70% LAD occlusion by her report, based on cardiac MRI. No intervention made. No current chest pain.  RIght leg pain - Mostly in right hip and right knee. Aching pain. Was worried about DVT given her history. No swelling. Went to ER and had doppler performed which was negative for DVT. Xray was also negative. Some improvement today. No changes made to medication.  Trying to exercise more. Walking on treadmill and using NuStep.  Wt Readings from Last 3 Encounters:  03/09/15 232 lb 8 oz (105.461 kg)  02/20/15 232 lb 4 oz (105.348 kg)  11/27/14 236 lb 8 oz (107.276 kg)   BP Readings from Last 3 Encounters:  03/09/15 143/69  02/20/15 165/80  11/27/14 124/69    Past Medical History  Diagnosis Date  . Foot fracture, right   . DVT (deep venous thrombosis) ([place])   . Diabetes mellitus   . History of back surgery 1980    X2  . Pneumonia   . Vitamin D deficiency   . Osteoporosis   . History of colon polyps   . Hyperlipidemia    Family History  Problem Relation Age of Onset  . Heart attack Father 75    MI  . Heart disease Father   . Arrhythmia Sister     pacemaker   Past Surgical History  Procedure Laterality Date  . Colectomy      for benign polyps  . Abdominal hysterectomy      total  . Arhtroscopy      left knee  . Cholecystectomy    . Appendectomy    . Cardiovascular stress test      abnormal, had cath which was normal  . Carpal tunnel release    . Cardiac catheterization      [facility]   . Glaucoma surgery      [facility]   Social History   Social History  . Marital Status: Married    Spouse Name: N/A  . Number of Children: N/A  . Years of Education: N/A   Social History Main Topics  . Smoking status: Never Smoker   . Smokeless  tobacco: Never Used  . Alcohol Use: No  . Drug Use: No  . Sexual Activity: Not Asked   Other Topics Concern  . None   Social History Narrative    Review of Systems  Constitutional: Negative for fever, chills, appetite change, fatigue and unexpected weight change.  Eyes: Negative for visual disturbance.  Respiratory: Negative for shortness of breath.   Cardiovascular: Positive for chest pain. Negative for palpitations and leg swelling.  Gastrointestinal: Negative for nausea, vomiting, abdominal pain, diarrhea and constipation.  Musculoskeletal: Positive for myalgias, back pain and arthralgias.  Skin: Negative for color change and rash.  Neurological: Negative for weakness.  Hematological: Negative for adenopathy. Does not bruise/bleed easily.  Psychiatric/Behavioral: Negative for dysphoric mood. The patient is not nervous/anxious.        Objective:    BP 143/69 mmHg  Pulse 72  Temp(Src) 98.2 F (36.8 C) (Oral)  Ht 5' 2.5" (1.588 m)  Wt 232 lb 8 oz (105.461 kg)  BMI 41.82 kg/m2  SpO2 97% Physical Exam  Constitutional: She is oriented to person, place, and time.  She appears well-developed and well-nourished. No distress.  HENT:  Head: Normocephalic and atraumatic.  Right Ear: External ear normal.  Left Ear: External ear normal.  Nose: Nose normal.  Mouth/Throat: Oropharynx is clear and moist. No oropharyngeal exudate.  Eyes: Conjunctivae are normal. Pupils are equal, round, and reactive to light. Right eye exhibits no discharge. Left eye exhibits no discharge. No scleral icterus.  Neck: Normal range of motion. Neck supple. No tracheal deviation present. No thyromegaly present.  Cardiovascular: Normal rate, regular rhythm, normal heart sounds and intact distal pulses.  Exam reveals no gallop and no friction rub.   No murmur heard. Pulmonary/Chest: Effort normal and breath sounds normal. No respiratory distress. She has no wheezes. She has no rales. She exhibits no  tenderness.  Musculoskeletal: Normal range of motion. She exhibits no edema or tenderness.  Lymphadenopathy:    She has no cervical adenopathy.  Neurological: She is alert and oriented to person, place, and time. No cranial nerve deficit. She exhibits normal muscle tone. Coordination normal.  Skin: Skin is warm and dry. No rash noted. She is not diaphoretic. No erythema. No pallor.  Psychiatric: She has a normal mood and affect. Her behavior is normal. Judgment and thought content normal.          Assessment & Plan:   Problem List Items Addressed This Visit      Unprioritized   Coronary artery disease due to lipid rich plaque    Reviewed notes from [facility] including stress MRI. Chest pain has resolved. Will set up follow up with Cardiology at [facility] at her request. Continue statin. She has been intolerant of anti-hypertensive meds in past because of hypotension.      Relevant Orders   Ambulatory referral to Cardiology   Diabetes mellitus type 2 in obese [facility]) - Primary    Lab Results  Component Value Date   HGBA1C 7.8* 02/28/2015   BG generally well controlled. Continue Metformin and Levemir.      Essential hypertension, benign    BP Readings from Last 3 Encounters:  03/09/15 143/69  02/20/15 165/80  11/27/14 124/69   BP generally well controlled, however higher last 2 checks. She has been unable to tolerate ACEi or ARB in past because of hypotension.      Hyperlipidemia    Lab Results  Component Value Date   LDLCALC 65 02/28/2015   Lipids well controlled. Continue Atorvastatin.      Morbid obesity ([place])    Wt Readings from Last 3 Encounters:  03/09/15 232 lb 8 oz (105.461 kg)  02/20/15 232 lb 4 oz (105.348 kg)  11/27/14 236 lb 8 oz (107.276 kg)   Body mass index is 41.82 kg/(m^2). Encouraged healthy diet and exercise.      Right leg pain    Right leg pain. Reviewed notes from [facility]. Xray was normal. Doppler normal. Suspect muscular strain from recent  increased physical activity. Will set up PT.      Relevant Orders   Ambulatory referral to Physical Therapy       Return in about 3 months (around 06/09/2015) for Recheck of Diabetes.

## 2015-03-09 NOTE — Progress Notes (Signed)
Pre visit review using our clinic review tool, if applicable. No additional management support is needed unless otherwise documented below in the visit note. 

## 2015-03-09 NOTE — Assessment & Plan Note (Signed)
Right leg pain. Reviewed notes from [facility]. Xray was normal. Doppler normal. Suspect muscular strain from recent increased physical activity. Will set up PT.

## 2015-03-09 NOTE — Assessment & Plan Note (Signed)
Lab Results  Component Value Date   LDLCALC 65 02/28/2015   Lipids well controlled. Continue Atorvastatin.

## 2015-03-09 NOTE — Assessment & Plan Note (Signed)
BP Readings from Last 3 Encounters:  03/09/15 143/69  02/20/15 165/80  11/27/14 124/69   BP generally well controlled, however higher last 2 checks. She has been unable to tolerate ACEi or ARB in past because of hypotension.

## 2015-03-09 NOTE — Addendum Note (Signed)
Addended by: Vernetta Honey on: 03/09/2015 03:25 PM   Modules accepted: Orders

## 2015-03-09 NOTE — Patient Instructions (Addendum)
We will set up physical therapy for leg pain.  We will set up cardiology evaluation.

## 2015-03-09 NOTE — Assessment & Plan Note (Signed)
Wt Readings from Last 3 Encounters:  03/09/15 232 lb 8 oz (105.461 kg)  02/20/15 232 lb 4 oz (105.348 kg)  11/27/14 236 lb 8 oz (107.276 kg)   Body mass index is 41.82 kg/(m^2). Encouraged healthy diet and exercise.

## 2015-03-09 NOTE — Assessment & Plan Note (Signed)
Reviewed notes from [facility] including stress MRI. Chest pain has resolved. Will set up follow up with Cardiology at [facility] at her request. Continue statin. She has been intolerant of anti-hypertensive meds in past because of hypotension.

## 2015-04-11 ENCOUNTER — Other Ambulatory Visit: Payer: Self-pay | Admitting: Internal Medicine

## 2015-04-18 ENCOUNTER — Ambulatory Visit
Admission: RE | Admit: 2015-04-18 | Discharge: 2015-04-18 | Disposition: A | Discharge status: Home / Self Care | Payer: Medicare Other | Source: Ambulatory Visit | Attending: Internal Medicine | Admitting: Internal Medicine

## 2015-04-18 ENCOUNTER — Encounter: Payer: Self-pay | Admitting: Internal Medicine

## 2015-04-18 ENCOUNTER — Ambulatory Visit (INDEPENDENT_AMBULATORY_CARE_PROVIDER_SITE_OTHER): Payer: Medicare Other | Admitting: Internal Medicine

## 2015-04-18 VITALS — BP 144/83 | HR 73 | Temp 98.5°F | Ht 62.5 in | Wt 229.4 lb

## 2015-04-18 DIAGNOSIS — I517 Cardiomegaly: Secondary | ICD-10-CM | POA: Diagnosis not present

## 2015-04-18 DIAGNOSIS — J219 Acute bronchiolitis, unspecified: Secondary | ICD-10-CM | POA: Diagnosis not present

## 2015-04-18 DIAGNOSIS — M47814 Spondylosis without myelopathy or radiculopathy, thoracic region: Secondary | ICD-10-CM | POA: Insufficient documentation

## 2015-04-18 DIAGNOSIS — R05 Cough: Secondary | ICD-10-CM | POA: Diagnosis not present

## 2015-04-18 DIAGNOSIS — I2510 Atherosclerotic heart disease of native coronary artery without angina pectoris: Secondary | ICD-10-CM | POA: Diagnosis not present

## 2015-04-18 MED ORDER — DOXYCYCLINE HYCLATE 100 MG PO TABS: 100.0000 mg | ORAL_TABLET | Freq: Two times a day (BID) | ORAL | Status: DC

## 2015-04-18 MED ORDER — GUAIFENESIN-CODEINE 100-10 MG/5ML PO SOLN: 5.0000 mL | Freq: Three times a day (TID) | ORAL | Status: DC | PRN

## 2015-04-18 NOTE — Progress Notes (Signed)
Pre visit review using our clinic review tool, if applicable. No additional management support is needed unless otherwise documented below in the visit note. 

## 2015-04-18 NOTE — Patient Instructions (Signed)
Start Doxycycline 100mg  twice daily.  Start Robitussin AC as needed for cough.  Chest xray today at [facility].  Follow up if symptoms are not improving.

## 2015-04-18 NOTE — Progress Notes (Signed)
Subjective:    Patient ID: [name], female    DOB: May 28, 1945, 69 y.o.   MRN: [id number]  HPI  69YO female presents for acute visit.  Cough - Started 3 weeks ago. Initially had subjective fever and sore throat and nasal congestion. Those symptoms improved, but cough persisted. At times, feels short of breath. Cough productive of yellow green mucous. Taking Ibuprofen and Tylenol only with minimal improvement. Unable to sleep because of cough.  Wt Readings from Last 3 Encounters:  04/18/15 229 lb 6 oz (104.044 kg)  03/09/15 232 lb 8 oz (105.461 kg)  02/20/15 232 lb 4 oz (105.348 kg)   BP Readings from Last 3 Encounters:  04/18/15 144/83  03/09/15 143/69  02/20/15 165/80    Past Medical History  Diagnosis Date  . Foot fracture, right   . DVT (deep venous thrombosis) ([place])   . Diabetes mellitus   . History of back surgery 1980    X2  . Pneumonia   . Vitamin D deficiency   . Osteoporosis   . History of colon polyps   . Hyperlipidemia    Family History  Problem Relation Age of Onset  . Heart attack Father 23    MI  . Heart disease Father   . Arrhythmia Sister     pacemaker   Past Surgical History  Procedure Laterality Date  . Colectomy      for benign polyps  . Abdominal hysterectomy      total  . Arhtroscopy      left knee  . Cholecystectomy    . Appendectomy    . Cardiovascular stress test      abnormal, had cath which was normal  . Carpal tunnel release    . Cardiac catheterization      [facility]   . Glaucoma surgery      [facility]   Social History   Social History  . Marital Status: Married    Spouse Name: N/A  . Number of Children: N/A  . Years of Education: N/A   Social History Main Topics  . Smoking status: Never Smoker   . Smokeless tobacco: Never Used  . Alcohol Use: No  . Drug Use: No  . Sexual Activity: Not Asked   Other Topics Concern  . None   Social History Narrative    Review of Systems  Constitutional: Positive  for fever and fatigue. Negative for chills and unexpected weight change.  HENT: Positive for congestion and sore throat. Negative for ear discharge, ear pain, facial swelling, hearing loss, mouth sores, nosebleeds, postnasal drip, rhinorrhea, sinus pressure, sneezing, tinnitus, trouble swallowing and voice change.   Eyes: Negative for pain, discharge, redness and visual disturbance.  Respiratory: Positive for cough, chest tightness and shortness of breath. Negative for wheezing and stridor.   Cardiovascular: Negative for chest pain, palpitations and leg swelling.  Gastrointestinal: Negative for nausea, vomiting, diarrhea and constipation.  Musculoskeletal: Negative for myalgias, arthralgias, neck pain and neck stiffness.  Skin: Negative for color change and rash.  Neurological: Negative for dizziness, weakness, light-headedness and headaches.  Hematological: Negative for adenopathy.  Psychiatric/Behavioral: Positive for sleep disturbance.       Objective:    BP 144/83 mmHg  Pulse 73  Temp(Src) 98.5 F (36.9 C) (Oral)  Ht 5' 2.5" (1.588 m)  Wt 229 lb 6 oz (104.044 kg)  BMI 41.26 kg/m2  SpO2 97% Physical Exam  Constitutional: She is oriented to person, place, and time. She appears well-developed  and well-nourished. No distress.  HENT:  Head: Normocephalic and atraumatic.  Right Ear: External ear normal.  Left Ear: External ear normal.  Nose: Nose normal.  Mouth/Throat: Oropharynx is clear and moist. No oropharyngeal exudate.  Eyes: Conjunctivae are normal. Pupils are equal, round, and reactive to light. Right eye exhibits no discharge. Left eye exhibits no discharge. No scleral icterus.  Neck: Normal range of motion. Neck supple. No tracheal deviation present. No thyromegaly present.  Cardiovascular: Normal rate, regular rhythm, normal heart sounds and intact distal pulses.  Exam reveals no gallop and no friction rub.   No murmur heard. Pulmonary/Chest: Effort normal. No accessory  muscle usage. No tachypnea. No respiratory distress. She has no decreased breath sounds. She has no wheezes. She has rhonchi (scattered throughout). She has no rales. She exhibits no tenderness.  Musculoskeletal: Normal range of motion. She exhibits no edema or tenderness.  Lymphadenopathy:    She has no cervical adenopathy.  Neurological: She is alert and oriented to person, place, and time. No cranial nerve deficit. She exhibits normal muscle tone. Coordination normal.  Skin: Skin is warm and dry. No rash noted. She is not diaphoretic. No erythema. No pallor.  Psychiatric: She has a normal mood and affect. Her behavior is normal. Judgment and thought content normal.          Assessment & Plan:   Problem List Items Addressed This Visit      Unprioritized   Acute bronchiolitis due to unspecified organism - Primary    Symptoms and exam c/w secondary bronchitis after viral URI. Will start Doxycycline 100mg  po bid. Codeine for cough. CXR today. Follow up if symptoms are not improving.      Relevant Medications   doxycycline (VIBRA-TABS) 100 MG tablet   Other Relevant Orders   DG Chest 2 View       Return if symptoms worsen or fail to improve.

## 2015-04-18 NOTE — Assessment & Plan Note (Signed)
Symptoms and exam c/w secondary bronchitis after viral URI. Will start Doxycycline 100mg  po bid. Codeine for cough. CXR today. Follow up if symptoms are not improving.

## 2015-04-19 MED ORDER — AMOXICILLIN-POT CLAVULANATE 875-125 MG PO TABS: 1.0000 | ORAL_TABLET | Freq: Two times a day (BID) | ORAL | Status: DC

## 2015-05-09 ENCOUNTER — Encounter: Payer: Self-pay | Admitting: *Deleted

## 2015-05-11 ENCOUNTER — Ambulatory Visit: Payer: Medicare Other | Admitting: Internal Medicine

## 2015-05-16 ENCOUNTER — Ambulatory Visit: Payer: Medicare Other | Admitting: Internal Medicine

## 2015-06-15 DIAGNOSIS — H40003 Preglaucoma, unspecified, bilateral: Secondary | ICD-10-CM | POA: Diagnosis not present

## 2015-06-15 LAB — HM DIABETES EYE EXAM

## 2015-06-22 DIAGNOSIS — I200 Unstable angina: Secondary | ICD-10-CM | POA: Diagnosis not present

## 2015-06-22 DIAGNOSIS — Z885 Allergy status to narcotic agent status: Secondary | ICD-10-CM | POA: Diagnosis not present

## 2015-06-22 DIAGNOSIS — E119 Type 2 diabetes mellitus without complications: Secondary | ICD-10-CM | POA: Diagnosis not present

## 2015-06-22 DIAGNOSIS — I25110 Atherosclerotic heart disease of native coronary artery with unstable angina pectoris: Secondary | ICD-10-CM | POA: Diagnosis not present

## 2015-06-22 DIAGNOSIS — Z91013 Allergy to seafood: Secondary | ICD-10-CM | POA: Diagnosis not present

## 2015-06-22 DIAGNOSIS — E785 Hyperlipidemia, unspecified: Secondary | ICD-10-CM | POA: Diagnosis not present

## 2015-06-22 DIAGNOSIS — Z9889 Other specified postprocedural states: Secondary | ICD-10-CM | POA: Diagnosis not present

## 2015-06-22 DIAGNOSIS — Z79899 Other long term (current) drug therapy: Secondary | ICD-10-CM | POA: Diagnosis not present

## 2015-06-22 DIAGNOSIS — Z888 Allergy status to other drugs, medicaments and biological substances status: Secondary | ICD-10-CM | POA: Diagnosis not present

## 2015-06-22 DIAGNOSIS — Z79891 Long term (current) use of opiate analgesic: Secondary | ICD-10-CM | POA: Diagnosis not present

## 2015-06-22 DIAGNOSIS — Z882 Allergy status to sulfonamides status: Secondary | ICD-10-CM | POA: Diagnosis not present

## 2015-06-22 DIAGNOSIS — Z7901 Long term (current) use of anticoagulants: Secondary | ICD-10-CM | POA: Diagnosis not present

## 2015-06-22 DIAGNOSIS — Z7984 Long term (current) use of oral hypoglycemic drugs: Secondary | ICD-10-CM | POA: Diagnosis not present

## 2015-06-22 DIAGNOSIS — I959 Hypotension, unspecified: Secondary | ICD-10-CM | POA: Diagnosis not present

## 2015-06-22 DIAGNOSIS — Z794 Long term (current) use of insulin: Secondary | ICD-10-CM | POA: Diagnosis not present

## 2015-06-28 DIAGNOSIS — I259 Chronic ischemic heart disease, unspecified: Secondary | ICD-10-CM | POA: Diagnosis not present

## 2015-06-28 DIAGNOSIS — E785 Hyperlipidemia, unspecified: Secondary | ICD-10-CM | POA: Diagnosis not present

## 2015-06-28 DIAGNOSIS — E119 Type 2 diabetes mellitus without complications: Secondary | ICD-10-CM | POA: Diagnosis not present

## 2015-06-28 DIAGNOSIS — Z7984 Long term (current) use of oral hypoglycemic drugs: Secondary | ICD-10-CM | POA: Diagnosis not present

## 2015-06-28 DIAGNOSIS — Z7982 Long term (current) use of aspirin: Secondary | ICD-10-CM | POA: Diagnosis not present

## 2015-06-28 DIAGNOSIS — I200 Unstable angina: Secondary | ICD-10-CM | POA: Diagnosis not present

## 2015-06-28 DIAGNOSIS — R079 Chest pain, unspecified: Secondary | ICD-10-CM | POA: Diagnosis not present

## 2015-06-28 DIAGNOSIS — Z794 Long term (current) use of insulin: Secondary | ICD-10-CM | POA: Diagnosis not present

## 2015-06-28 DIAGNOSIS — I2510 Atherosclerotic heart disease of native coronary artery without angina pectoris: Secondary | ICD-10-CM | POA: Diagnosis not present

## 2015-06-28 DIAGNOSIS — I25110 Atherosclerotic heart disease of native coronary artery with unstable angina pectoris: Secondary | ICD-10-CM | POA: Diagnosis not present

## 2015-07-01 ENCOUNTER — Ambulatory Visit
Admission: EM | Admit: 2015-07-01 | Discharge: 2015-07-01 | Disposition: A | Discharge status: Home / Self Care | Payer: Medicare Other | Attending: Family Medicine | Admitting: Family Medicine

## 2015-07-01 DIAGNOSIS — Z794 Long term (current) use of insulin: Secondary | ICD-10-CM | POA: Insufficient documentation

## 2015-07-01 DIAGNOSIS — Z79899 Other long term (current) drug therapy: Secondary | ICD-10-CM | POA: Diagnosis not present

## 2015-07-01 DIAGNOSIS — I2510 Atherosclerotic heart disease of native coronary artery without angina pectoris: Secondary | ICD-10-CM | POA: Diagnosis not present

## 2015-07-01 DIAGNOSIS — Z8679 Personal history of other diseases of the circulatory system: Secondary | ICD-10-CM

## 2015-07-01 DIAGNOSIS — Z86718 Personal history of other venous thrombosis and embolism: Secondary | ICD-10-CM | POA: Diagnosis not present

## 2015-07-01 DIAGNOSIS — Z8709 Personal history of other diseases of the respiratory system: Secondary | ICD-10-CM

## 2015-07-01 DIAGNOSIS — E559 Vitamin D deficiency, unspecified: Secondary | ICD-10-CM | POA: Diagnosis not present

## 2015-07-01 DIAGNOSIS — Z7984 Long term (current) use of oral hypoglycemic drugs: Secondary | ICD-10-CM | POA: Diagnosis not present

## 2015-07-01 DIAGNOSIS — E119 Type 2 diabetes mellitus without complications: Secondary | ICD-10-CM | POA: Insufficient documentation

## 2015-07-01 DIAGNOSIS — E785 Hyperlipidemia, unspecified: Secondary | ICD-10-CM | POA: Insufficient documentation

## 2015-07-01 DIAGNOSIS — Y840 Cardiac catheterization as the cause of abnormal reaction of the patient, or of later complication, without mention of misadventure at the time of the procedure: Secondary | ICD-10-CM

## 2015-07-01 DIAGNOSIS — R0602 Shortness of breath: Secondary | ICD-10-CM | POA: Diagnosis present

## 2015-07-01 DIAGNOSIS — Z7982 Long term (current) use of aspirin: Secondary | ICD-10-CM | POA: Diagnosis not present

## 2015-07-01 DIAGNOSIS — J9801 Acute bronchospasm: Secondary | ICD-10-CM | POA: Diagnosis not present

## 2015-07-01 LAB — RAPID INFLUENZA A&B ANTIGENS
Influenza A (ARMC): NOT DETECTED
Influenza B (ARMC): NOT DETECTED

## 2015-07-01 MED ORDER — AMOXICILLIN-POT CLAVULANATE 875-125 MG PO TABS: 1.0000 | ORAL_TABLET | Freq: Two times a day (BID) | ORAL | Status: DC

## 2015-07-01 MED ORDER — OSELTAMIVIR PHOSPHATE 75 MG PO CAPS: 75.0000 mg | ORAL_CAPSULE | Freq: Two times a day (BID) | ORAL | Status: DC

## 2015-07-01 MED ORDER — HYDROCOD POLST-CPM POLST ER 10-8 MG/5ML PO SUER: 5.0000 mL | Freq: Two times a day (BID) | ORAL | Status: DC | PRN

## 2015-07-01 NOTE — Discharge Instructions (Signed)
Bronchospasm, Adult A bronchospasm is when the tubes that carry air in and out of your lungs (airways) spasm or tighten. During a bronchospasm it is hard to breathe. This is because the airways get smaller. A bronchospasm can be triggered by:  Allergies. These may be to animals, pollen, food, or mold.  Infection. This is a common cause of bronchospasm.  Exercise.  Irritants. These include pollution, cigarette smoke, strong odors, aerosol sprays, and paint fumes.  Weather changes.  Stress.  Being emotional. HOME CARE   Always have a plan for getting help. Know when to call your doctor and local emergency services (911 in the [place] Know where you can get emergency care.  Only take medicines as told by your doctor.  If you were prescribed an inhaler or nebulizer machine, ask your doctor how to use it correctly. Always use a spacer with your inhaler if you were given one.  Stay calm during an attack. Try to relax and breathe more slowly.  Control your home environment:  Change your heating and air conditioning filter at least once a month.  Limit your use of fireplaces and wood stoves.  Do not  smoke. Do not  allow smoking in your home.  Avoid perfumes and fragrances.  Get rid of pests (such as roaches and mice) and their droppings.  Throw away plants if you see mold on them.  Keep your house clean and dust free.  Replace carpet with wood, tile, or vinyl flooring. Carpet can trap dander and dust.  Use allergy-proof pillows, mattress covers, and box spring covers.  Wash bed sheets and blankets every week in hot water. Dry them in a dryer.  Use blankets that are made of polyester or cotton.  Wash hands frequently. GET HELP IF:  You have muscle aches.  You have chest pain.  The thick spit you spit or cough up (sputum) changes from clear or white to yellow, green, gray, or bloody.  The thick spit you spit or cough up gets thicker.  There are problems that may be  related to the medicine you are given such as:  A rash.  Itching.  Swelling.  Trouble breathing. GET HELP RIGHT AWAY IF:  You feel you cannot breathe or catch your breath.  You cannot stop coughing.  Your treatment is not helping you breathe better.  You have very bad chest pain. MAKE SURE YOU:   Understand these instructions.  Will watch your condition.  Will get help right away if you are not doing well or get worse.   This information is not intended to replace advice given to you by your health care provider. Make sure you discuss any questions you have with your health care provider.   Document Released: 02/23/2009 Document Revised: 05/19/2014 Document Reviewed: 10/19/2012 Elsevier Interactive Patient Education 2016 Elsevier Inc.  

## 2015-07-01 NOTE — ED Notes (Signed)
Congestion/ cough, fever of 102, malaise, body aches since Thursday.

## 2015-07-01 NOTE — ED Provider Notes (Signed)
CSN: [id number]     Arrival date & time 07/01/15  [id number] History   None   Nurses notes were reviewed.   Chief Complaint  Patient presents with  . URI   Patient's here because of multiple complaints #1 shortness of breath. She states she is running a fever. She states last I should a fever 102.4. She is only to talk Dr. Gilford Rile Monday. That she couldn't stand another night like last night. She reports that since she had a heart catheterization on Thursday that evening she started feeling poorly on Friday she started having symptoms of URI coughing sore throat nasal congestion yesterday she felt miserable and then last night she started running a fever 102.4. She is exposed to a daughter and a granddaughter is present time with him who were diagnosed with flu last week. She had the flu shot vaccination before hand so did her family members who came down with the flu. They were positive on swab with the flu.  She reports that she is also wheezing she's had a history of recurrent bronchitis and once she has bronchitis she's informing felt like the first 10 minutes of Dr. Gilford Rile calls her and either some type of erythromycin Augmentin and a codeine cough syrup. In the centimeter helps her get over it. During the visit multiple times she is reminded me of that.  She is also concerned because after her catheterization her right hand swelled. Apparently she has some type of consultation which is unable to tell me specifically what happened. She is on Zarontin O she had a 70% occlusion but didn't meet the Medicare guidelines 75% or more to undergo stent or balloon angioplasty. She states that they had to do something to hand to fix it at the hand was markedly swollen purple now there is some redness and some swelling and she is worried that this may be a problem. She had catheterization at [facility] and did not call the [facility] people about the swelling in her hand.  History of obvious of heart disease diabetes  multiple back surgeries and history of recurrent bronchitis and pneumonias. She also history of hyperlipidemia. Other than catheterizations she's also had a cholecystectomy appendectomy and hysterectomy. Father heart Disease and Sister Has a Pacemaker.   (Consider location/radiation/quality/duration/timing/severity/associated sxs/prior Treatment) Patient is a 70 y.o. female presenting with URI. The history is provided by the patient. No language interpreter was used.  URI Presenting symptoms: congestion, cough, fever and rhinorrhea   Severity:  Moderate Progression:  Worsening Chronicity:  Recurrent Relieved by:  Nothing Ineffective treatments:  None tried Risk factors: chronic cardiac disease, chronic respiratory disease and immunosuppression     Past Medical History  Diagnosis Date  . Foot fracture, right   . DVT (deep venous thrombosis) ([place])   . Diabetes mellitus   . History of back surgery 1980    X2  . Pneumonia   . Vitamin D deficiency   . Osteoporosis   . History of colon polyps   . Hyperlipidemia    Past Surgical History  Procedure Laterality Date  . Colectomy      for benign polyps  . Abdominal hysterectomy      total  . Arhtroscopy      left knee  . Cholecystectomy    . Appendectomy    . Cardiovascular stress test      abnormal, had cath which was normal  . Carpal tunnel release    . Cardiac catheterization  [place] surgery      [facility]   Family History  Problem Relation Age of Onset  . Heart attack Father 87    MI  . Heart disease Father   . Arrhythmia Sister     pacemaker   Social History  Substance Use Topics  . Smoking status: Never Smoker   . Smokeless tobacco: Never Used  . Alcohol Use: No   OB History    No data available     Review of Systems  Constitutional: Positive for fever.  HENT: Positive for congestion and rhinorrhea.   Respiratory: Positive for cough.   All other systems reviewed and are  negative.   Allergies  Tramadol; Buprenex; Iodinated diagnostic agents; Lisinopril; Losartan; Sulfa drugs cross reactors; and Ventolin  Home Medications   Prior to Admission medications   Medication Sig Start Date End Date Taking? Authorizing Provider  aspirin 325 MG tablet Take 325 mg by mouth daily.   Yes Historical Provider, MD  atorvastatin (LIPITOR) 20 MG tablet Take 1 tablet (20 mg total) by mouth daily. 10/03/14  Yes Jackolyn Confer, MD  cyanocobalamin (,VITAMIN B-12,) 1000 MCG/ML injection Inject 1,000 mcg into the muscle once.   Yes Historical Provider, MD  glucose blood (FREESTYLE LITE) test strip Check sugar 2-3 times daily Dx E10.69 02/07/15  Yes Jackolyn Confer, MD  Insulin Detemir (LEVEMIR FLEXTOUCH) 100 UNIT/ML Pen Inject 60 Units into the skin 2 (two) times daily. 02/09/15  Yes Jackolyn Confer, MD  magnesium oxide (MAG-OX) 400 MG tablet Take 1 tablet (400 mg total) by mouth 2 (two) times daily. 02/08/14  Yes Jackolyn Confer, MD  MECLIZINE HCL PO Take by mouth.   Yes Historical Provider, MD  metFORMIN (GLUCOPHAGE) 1000 MG tablet TAKE ONE TABLET BY MOUTH TWICE DAILY WITH MEALS 04/12/15  Yes Jackolyn Confer, MD  nitroGLYCERIN (NITROSTAT) 0.4 MG SL tablet Place 1 tablet (0.4 mg total) under the tongue every 5 (five) minutes as needed for chest pain. 04/09/12  Yes Jackolyn Confer, MD  amoxicillin-clavulanate (AUGMENTIN) 875-125 MG tablet Take 1 tablet by mouth 2 (two) times daily. 04/19/15   Jackolyn Confer, MD  amoxicillin-clavulanate (AUGMENTIN) 875-125 MG tablet Take 1 tablet by mouth 2 (two) times daily. 07/01/15   Frederich Cha, MD  chlorpheniramine-HYDROcodone [facility] PENNKINETIC ER) 10-8 MG/5ML SUER Take 5 mLs by mouth every 12 (twelve) hours as needed for cough. 07/01/15   Frederich Cha, MD  doxycycline (VIBRA-TABS) 100 MG tablet Take 1 tablet (100 mg total) by mouth 2 (two) times daily. 04/18/15   Jackolyn Confer, MD  guaiFENesin-codeine 100-10 MG/5ML syrup Take 5 mLs  by mouth 3 (three) times daily as needed for cough. 04/18/15   Jackolyn Confer, MD  oseltamivir (TAMIFLU) 75 MG capsule Take 1 capsule (75 mg total) by mouth 2 (two) times daily. 07/01/15   Frederich Cha, MD   Meds Ordered and Administered this Visit  Medications - No data to display  BP 150/84 mmHg  Temp(Src) 98.1 F (36.7 C) (Oral)  Resp 16  Ht 5\' 2"  (1.575 m)  Wt 225 lb (102.059 kg)  BMI 41.14 kg/m2  SpO2 97% No data found.   Physical Exam  Constitutional: She is oriented to person, place, and time. She appears well-developed and well-nourished.  HENT:  Head: Normocephalic and atraumatic.  Eyes: Conjunctivae are normal. Pupils are equal, round, and reactive to light.  Neck: Normal range of motion.  Cardiovascular: Normal rate, regular  rhythm, S2 normal and normal heart sounds.   Pulmonary/Chest: Effort normal. No accessory muscle usage. No tachypnea. No respiratory distress. She has wheezes.  Musculoskeletal: Normal range of motion.  Neurological: She is alert and oriented to person, place, and time.  Skin: Skin is warm. Rash noted. There is erythema.     She has some mild erythema of the dorsum of the right hand. And there is some bruising present where she underwent catheterization of the right hand and right wrist.  Vitals reviewed.   ED Course  Procedures (including critical care time)  Labs Review Labs Reviewed  RAPID INFLUENZA A&B ANTIGENS ([place])    Imaging Review No results found.   Visual Acuity Review  Right Eye Distance:   Left Eye Distance:   Bilateral Distance:    Right Eye Near:   Left Eye Near:    Bilateral Near:    Results for orders placed or performed during the hospital encounter of 07/01/15  Rapid Influenza A&B Antigens ([facility] only)  Result Value Ref Range   Influenza A ([facility]) NOT DETECTED    Influenza B ([facility]) NOT DETECTED       MDM   1. History of acute bronchitis with bronchospasm   2. Bronchospasm, acute   3. Hx of  coronary stenosis   4. Cardiac catheterisation as the cause of abnormal reaction of patient, or of later complication     I'll inform patient out place her on the amoxicillin that Dr. Gilford Rile always place her on however is really Augmentin and that is what we are going to prescribe. I'm prescribing Tussionex cough medicine IUs she is allergic to tramadol but since she takes codeine with guaifenesin that should not be a problem. Also she wants codeine Lanoxin she called Dr. Thomes Dinning office tomorrow. Explained to her this she may or may not benefit from Tamiflu and even though her flu test is negative because of her close exposure to people with flu I will go ahead and treat her with Tamiflu. Since she's had symptoms of URI the wheezing and fever and cough and nasal congestion or blue do not think that the fever that she's having or the symptoms she is having a mild is coming from a catheterization I think this is incidental. Also reassured her that the hand swelling does not appear to be that significant no other discoloration. She has to admit that it does look better since Thursday suggested she give this more time to let this problem resolved.    Frederich Cha, MD 07/01/15 [phone]

## 2015-07-09 ENCOUNTER — Telehealth: Payer: Self-pay | Admitting: *Deleted

## 2015-07-09 MED ORDER — FLUCONAZOLE 150 MG PO TABS: 150.0000 mg | ORAL_TABLET | Freq: Once | ORAL | Status: DC

## 2015-07-09 NOTE — Telephone Encounter (Signed)
Fine to call in Diflucan 150mg po x 1 

## 2015-07-09 NOTE — Telephone Encounter (Signed)
Pt wants a script for a diflucan pill, pt was given an antibiotic and is now having a yeast infection. Please advise, thanks

## 2015-07-09 NOTE — Telephone Encounter (Signed)
Patient was prescribed  with an antibiotic, by Dr. Alveta Heimlich at urgent care. She's requesting a medication for yeast infection, please advise  Pharmacy [facility] in [place]

## 2015-07-09 NOTE — Telephone Encounter (Signed)
Diflucan sent

## 2015-07-13 ENCOUNTER — Encounter: Payer: Self-pay | Admitting: Internal Medicine

## 2015-07-13 ENCOUNTER — Ambulatory Visit (INDEPENDENT_AMBULATORY_CARE_PROVIDER_SITE_OTHER): Payer: Medicare Other | Admitting: Internal Medicine

## 2015-07-13 VITALS — BP 133/79 | HR 69 | Temp 98.4°F | Ht 62.5 in | Wt 229.0 lb

## 2015-07-13 DIAGNOSIS — I10 Essential (primary) hypertension: Secondary | ICD-10-CM | POA: Diagnosis not present

## 2015-07-13 DIAGNOSIS — E669 Obesity, unspecified: Secondary | ICD-10-CM

## 2015-07-13 DIAGNOSIS — E538 Deficiency of other specified B group vitamins: Secondary | ICD-10-CM

## 2015-07-13 DIAGNOSIS — E1169 Type 2 diabetes mellitus with other specified complication: Secondary | ICD-10-CM

## 2015-07-13 DIAGNOSIS — E119 Type 2 diabetes mellitus without complications: Secondary | ICD-10-CM | POA: Diagnosis not present

## 2015-07-13 DIAGNOSIS — R079 Chest pain, unspecified: Secondary | ICD-10-CM

## 2015-07-13 LAB — COMPREHENSIVE METABOLIC PANEL
ALT: 11 U/L (ref 0–35)
AST: 13 U/L (ref 0–37)
Albumin: 4.3 g/dL (ref 3.5–5.2)
Alkaline Phosphatase: 56 U/L (ref 39–117)
BILIRUBIN TOTAL: 0.4 mg/dL (ref 0.2–1.2)
BUN: 13 mg/dL (ref 6–23)
CALCIUM: 9.9 mg/dL (ref 8.4–10.5)
CHLORIDE: 100 meq/L (ref 96–112)
CO2: 30 mEq/L (ref 19–32)
CREATININE: 0.68 mg/dL (ref 0.40–1.20)
GFR: 90.99 mL/min (ref >60.00)
Glucose, Bld: 142 mg/dL — ABNORMAL HIGH (ref 70–99)
Potassium: 4.5 mEq/L (ref 3.5–5.1)
Sodium: 139 mEq/L (ref 135–145)
Total Protein: 7.2 g/dL (ref 6.0–8.3)

## 2015-07-13 LAB — HEMOGLOBIN A1C: Hgb A1c MFr Bld: 8.7 % — ABNORMAL HIGH (ref 4.6–6.5)

## 2015-07-13 MED ORDER — CYANOCOBALAMIN 1000 MCG/ML IJ SOLN
1000.0000 ug | Freq: Once | INTRAMUSCULAR | Status: AC
  Administered 2015-07-13: 1000 ug via INTRAMUSCULAR

## 2015-07-13 NOTE — Progress Notes (Signed)
Pre visit review using our clinic review tool, if applicable. No additional management support is needed unless otherwise documented below in the visit note. 

## 2015-07-13 NOTE — Assessment & Plan Note (Signed)
Chronic chest pain. Reviewed notes including recent cath from [facility]. 70% distal LAD lesion noted. Plan per cardiology for medical management. Question if Ranexa might be helpful. Follow up here in 3 months and prn.

## 2015-07-13 NOTE — Addendum Note (Signed)
Addended by: Kerin Salen R on: 07/13/2015 12:20 PM   Modules accepted: Orders

## 2015-07-13 NOTE — Patient Instructions (Signed)
Labs today.  Follow up in 3 months or sooner as needed. 

## 2015-07-13 NOTE — Assessment & Plan Note (Signed)
Will check A1c with labs. Continue Levemir and metformin.

## 2015-07-13 NOTE — Assessment & Plan Note (Signed)
BP Readings from Last 3 Encounters:  07/13/15 133/79  07/01/15 150/84  04/18/15 144/83   BP well controlled today. In the past, she has had hypotension which has precluded treatment with ACEi. Will monitor.

## 2015-07-13 NOTE — Progress Notes (Signed)
Subjective:    Patient ID: [name], female    DOB: 1945-11-01, 70 y.o.   MRN: [id number]  HPI  70YO female presents for follow up.  Last seen in 04/2015.  06/28/2015 Cardiac cath at [place] 70% occlusion distal LAD. Opted for medical management. Continues to have chest pain. Using NTG and aspirin for chest pain. However holding aspirin while on Xarelto. Has follow up with cardiology next month. 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 On Xarelto for 30days.  DM - BG "fair." Compliant with medication.  Recently treated for URI with Augmentin and Tamiflu. However rapid flu was negative.  Wt Readings from Last 3 Encounters:  07/13/15 229 lb (103.874 kg)  07/01/15 225 lb (102.059 kg)  04/18/15 229 lb 6 oz (104.044 kg)   BP Readings from Last 3 Encounters:  07/13/15 133/79  07/01/15 150/84  04/18/15 144/83    Past Medical History  Diagnosis Date  . Foot fracture, right   . DVT (deep venous thrombosis) ([place])   . Diabetes mellitus   . History of back surgery 1980    X2  . Pneumonia   . Vitamin D deficiency   . Osteoporosis   . History of colon polyps   . Hyperlipidemia    Family History  Problem Relation Age of Onset  . Heart attack Father 44    MI  . Heart disease Father   . Arrhythmia Sister     pacemaker   Past Surgical History  Procedure Laterality Date  . Colectomy      for benign polyps  . Abdominal hysterectomy      total  . Arhtroscopy      left knee  . Cholecystectomy    . Appendectomy    . Cardiovascular stress test      abnormal, had cath which was normal  . Carpal tunnel release    . Cardiac catheterization      [facility]   . Glaucoma surgery      [facility]   Social History   Social History  . Marital Status: Married    Spouse Name: N/A  . Number of Children: N/A  . Years of Education: N/A   Social History Main Topics  . Smoking status: Never Smoker   . Smokeless tobacco: Never Used  . Alcohol Use:  No  . Drug Use: No  . Sexual Activity: Not Asked   Other Topics Concern  . None   Social History Narrative    Review of Systems  Constitutional: Positive for fatigue. Negative for fever, chills, appetite change and unexpected weight change.  Eyes: Negative for visual disturbance.  Respiratory: Negative for shortness of breath.   Cardiovascular: Positive for chest pain. Negative for palpitations and leg swelling.  Gastrointestinal: Negative for nausea, vomiting, abdominal pain, diarrhea and constipation.  Musculoskeletal: Negative for myalgias and arthralgias.  Skin: Negative for color change and rash.  Hematological: Negative for adenopathy. Does not bruise/bleed easily.  Psychiatric/Behavioral: Negative for sleep disturbance and dysphoric mood. The patient is not nervous/anxious.        Objective:    BP 133/79 mmHg  Pulse 69  Temp(Src) 98.4 F (36.9 C) (Oral)  Ht 5' 2.5" (1.588 m)  Wt 229 lb (103.874 kg)  BMI 41.19 kg/m2  SpO2 98% Physical Exam  Constitutional: She is oriented to person, place, and time. She appears well-developed and well-nourished. No distress.  HENT:  Head: Normocephalic and atraumatic.  Right Ear: External ear normal.  Left Ear: External ear normal.  Nose: Nose normal.  Mouth/Throat: Oropharynx is clear and moist. No oropharyngeal exudate.  Eyes: Conjunctivae are normal. Pupils are equal, round, and reactive to light. Right eye exhibits no discharge. Left eye exhibits no discharge. No scleral icterus.  Neck: Normal range of motion. Neck supple. No tracheal deviation present. No thyromegaly present.  Cardiovascular: Normal rate, regular rhythm, normal heart sounds and intact distal pulses.  Exam reveals no gallop and no friction rub.   No murmur heard. Pulmonary/Chest: Effort normal and breath sounds normal. No respiratory distress. She has no wheezes. She has no rales. She exhibits no tenderness.  Musculoskeletal: Normal range of motion. She  exhibits no edema or tenderness.  Lymphadenopathy:    She has no cervical adenopathy.  Neurological: She is alert and oriented to person, place, and time. No cranial nerve deficit. She exhibits normal muscle tone. Coordination normal.  Skin: Skin is warm and dry. No rash noted. She is not diaphoretic. No erythema. No pallor.  Psychiatric: She has a normal mood and affect. Her behavior is normal. Judgment and thought content normal.          Assessment & Plan:   Problem List Items Addressed This Visit      Unprioritized   Chest pain    Chronic chest pain. Reviewed notes including recent cath from [facility]. 70% distal LAD lesion noted. Plan per cardiology for medical management. Question if Ranexa might be helpful. Follow up here in 3 months and prn.      Diabetes mellitus type 2 in obese ([place]) - Primary    Will check A1c with labs. Continue Levemir and metformin.      Relevant Orders   Comprehensive metabolic panel   Hemoglobin A1c   Essential hypertension, benign    BP Readings from Last 3 Encounters:  07/13/15 133/79  07/01/15 150/84  04/18/15 144/83   BP well controlled today. In the past, she has had hypotension which has precluded treatment with ACEi. Will monitor.      Relevant Medications   rivaroxaban (XARELTO) 20 MG TABS tablet       Return in about 3 months (around 10/13/2015) for Recheck of Diabetes.  Ronette Deter, MD Internal Medicine [facility]

## 2015-07-16 ENCOUNTER — Encounter: Payer: Self-pay | Admitting: *Deleted

## 2015-08-18 ENCOUNTER — Ambulatory Visit: Payer: Medicare Other

## 2015-08-18 ENCOUNTER — Ambulatory Visit
Admission: EM | Admit: 2015-08-18 | Discharge: 2015-08-18 | Disposition: A | Discharge status: Home / Self Care | Payer: Medicare Other | Attending: Family Medicine | Admitting: Family Medicine

## 2015-08-18 DIAGNOSIS — E119 Type 2 diabetes mellitus without complications: Secondary | ICD-10-CM | POA: Insufficient documentation

## 2015-08-18 DIAGNOSIS — Z90710 Acquired absence of both cervix and uterus: Secondary | ICD-10-CM | POA: Diagnosis not present

## 2015-08-18 DIAGNOSIS — I10 Essential (primary) hypertension: Secondary | ICD-10-CM | POA: Diagnosis not present

## 2015-08-18 DIAGNOSIS — I2510 Atherosclerotic heart disease of native coronary artery without angina pectoris: Secondary | ICD-10-CM | POA: Insufficient documentation

## 2015-08-18 DIAGNOSIS — N132 Hydronephrosis with renal and ureteral calculous obstruction: Secondary | ICD-10-CM | POA: Diagnosis not present

## 2015-08-18 DIAGNOSIS — R109 Unspecified abdominal pain: Secondary | ICD-10-CM | POA: Diagnosis not present

## 2015-08-18 DIAGNOSIS — Z888 Allergy status to other drugs, medicaments and biological substances status: Secondary | ICD-10-CM | POA: Diagnosis not present

## 2015-08-18 DIAGNOSIS — I2583 Coronary atherosclerosis due to lipid rich plaque: Secondary | ICD-10-CM | POA: Diagnosis not present

## 2015-08-18 DIAGNOSIS — K439 Ventral hernia without obstruction or gangrene: Secondary | ICD-10-CM | POA: Diagnosis not present

## 2015-08-18 DIAGNOSIS — Z9889 Other specified postprocedural states: Secondary | ICD-10-CM | POA: Insufficient documentation

## 2015-08-18 DIAGNOSIS — Z9049 Acquired absence of other specified parts of digestive tract: Secondary | ICD-10-CM | POA: Insufficient documentation

## 2015-08-18 DIAGNOSIS — M8580 Other specified disorders of bone density and structure, unspecified site: Secondary | ICD-10-CM | POA: Diagnosis not present

## 2015-08-18 DIAGNOSIS — E559 Vitamin D deficiency, unspecified: Secondary | ICD-10-CM | POA: Diagnosis not present

## 2015-08-18 DIAGNOSIS — Z79899 Other long term (current) drug therapy: Secondary | ICD-10-CM | POA: Diagnosis not present

## 2015-08-18 DIAGNOSIS — Z87442 Personal history of urinary calculi: Secondary | ICD-10-CM | POA: Diagnosis not present

## 2015-08-18 DIAGNOSIS — R319 Hematuria, unspecified: Secondary | ICD-10-CM | POA: Diagnosis not present

## 2015-08-18 DIAGNOSIS — Z7982 Long term (current) use of aspirin: Secondary | ICD-10-CM | POA: Insufficient documentation

## 2015-08-18 DIAGNOSIS — R112 Nausea with vomiting, unspecified: Secondary | ICD-10-CM | POA: Diagnosis not present

## 2015-08-18 DIAGNOSIS — Z86010 Personal history of colonic polyps: Secondary | ICD-10-CM | POA: Insufficient documentation

## 2015-08-18 DIAGNOSIS — R110 Nausea: Secondary | ICD-10-CM | POA: Diagnosis not present

## 2015-08-18 DIAGNOSIS — N200 Calculus of kidney: Secondary | ICD-10-CM | POA: Diagnosis not present

## 2015-08-18 DIAGNOSIS — Z9089 Acquired absence of other organs: Secondary | ICD-10-CM | POA: Diagnosis not present

## 2015-08-18 DIAGNOSIS — Z86718 Personal history of other venous thrombosis and embolism: Secondary | ICD-10-CM | POA: Insufficient documentation

## 2015-08-18 DIAGNOSIS — E785 Hyperlipidemia, unspecified: Secondary | ICD-10-CM | POA: Diagnosis not present

## 2015-08-18 DIAGNOSIS — N3000 Acute cystitis without hematuria: Secondary | ICD-10-CM | POA: Diagnosis not present

## 2015-08-18 DIAGNOSIS — N3001 Acute cystitis with hematuria: Secondary | ICD-10-CM | POA: Diagnosis not present

## 2015-08-18 DIAGNOSIS — N760 Acute vaginitis: Secondary | ICD-10-CM | POA: Diagnosis not present

## 2015-08-18 DIAGNOSIS — B9689 Other specified bacterial agents as the cause of diseases classified elsewhere: Secondary | ICD-10-CM | POA: Diagnosis not present

## 2015-08-18 DIAGNOSIS — R350 Frequency of micturition: Secondary | ICD-10-CM | POA: Diagnosis not present

## 2015-08-18 LAB — URINALYSIS COMPLETE WITH MICROSCOPIC (ARMC ONLY)
Bilirubin Urine: NEGATIVE
GLUCOSE, UA: 100 mg/dL — AB
KETONES UR: NEGATIVE mg/dL
Leukocytes, UA: NEGATIVE
Nitrite: NEGATIVE
Protein, ur: 30 mg/dL — AB
SPECIFIC GRAVITY, URINE: 1.025 (ref 1.005–1.030)
pH: 5 (ref 5.0–8.0)

## 2015-08-18 LAB — CBC WITH DIFFERENTIAL/PLATELET
Basophils Absolute: 0 10*3/uL (ref 0–0.1)
Basophils Relative: 0 %
EOS PCT: 0 %
Eosinophils Absolute: 0.1 10*3/uL (ref 0–0.7)
HEMATOCRIT: 40.9 % (ref 35.0–47.0)
Hemoglobin: 13.7 g/dL (ref 12.0–16.0)
LYMPHS ABS: 2.4 10*3/uL (ref 1.0–3.6)
LYMPHS PCT: 17 %
MCH: 28.8 pg (ref 26.0–34.0)
MCHC: 33.5 g/dL (ref 32.0–36.0)
MCV: 86 fL (ref 80.0–100.0)
Monocytes Absolute: 1 10*3/uL — ABNORMAL HIGH (ref 0.2–0.9)
Monocytes Relative: 7 %
NEUTROS ABS: 10.3 10*3/uL — AB (ref 1.4–6.5)
Neutrophils Relative %: 76 %
PLATELETS: 388 10*3/uL (ref 150–440)
RBC: 4.75 MIL/uL (ref 3.80–5.20)
RDW: 14.6 % — ABNORMAL HIGH (ref 11.5–14.5)
WBC: 13.8 10*3/uL — AB (ref 3.6–11.0)

## 2015-08-18 LAB — BASIC METABOLIC PANEL
ANION GAP: 7 (ref 5–15)
BUN: 20 mg/dL (ref 6–20)
CHLORIDE: 99 mmol/L — AB (ref 101–111)
CO2: 26 mmol/L (ref 22–32)
Calcium: 8.8 mg/dL — ABNORMAL LOW (ref 8.9–10.3)
Creatinine, Ser: 0.95 mg/dL (ref 0.44–1.00)
GFR calc Af Amer: >60 mL/min (ref >60)
GFR, EST NON AFRICAN AMERICAN: 60 mL/min — AB (ref >60)
GLUCOSE: 211 mg/dL — AB (ref 65–99)
POTASSIUM: 4 mmol/L (ref 3.5–5.1)
Sodium: 132 mmol/L — ABNORMAL LOW (ref 135–145)

## 2015-08-18 MED ORDER — ONDANSETRON 8 MG PO TBDP
8.0000 mg | ORAL_TABLET | Freq: Once | ORAL | Status: AC
  Administered 2015-08-18: 8 mg via ORAL

## 2015-08-18 NOTE — ED Notes (Signed)
Burning pain left flank x "few days". Also c/o intermittent dysuria, nausea, dark urine, decreased urine output. Pt concerned this is a UTI vs kidney stones. Has had stones in the past, this pain is more severe.

## 2015-08-18 NOTE — Discharge Instructions (Signed)
Go directly to the emergency room as discussed. This is important.Do not eat or drink anything in between.    Flank Pain Flank pain refers to pain that is located on the side of the body between the upper abdomen and the back. The pain may occur over a short period of time (acute) or may be long-term or reoccurring (chronic). It may be mild or severe. Flank pain can be caused by many things. CAUSES  Some of the more common causes of flank pain include:  Muscle strains.   Muscle spasms.   A disease of your spine (vertebral disk disease).   A lung infection (pneumonia).   Fluid around your lungs (pulmonary edema).   A kidney infection.   Kidney stones.   A very painful skin rash caused by the chickenpox virus (shingles).   Gallbladder disease.  [place] care will depend on the cause of your pain. In general,  Rest as directed by your caregiver.  Drink enough fluids to keep your urine clear or pale yellow.  Only take over-the-counter or prescription medicines as directed by your caregiver. Some medicines may help relieve the pain.  Tell your caregiver about any changes in your pain.  Follow up with your caregiver as directed. SEEK IMMEDIATE MEDICAL CARE IF:   Your pain is not controlled with medicine.   You have new or worsening symptoms.  Your pain increases.   You have abdominal pain.   You have shortness of breath.   You have persistent nausea or vomiting.   You have swelling in your abdomen.   You feel faint or pass out.   You have blood in your urine.  You have a fever or persistent symptoms for more than 2-3 days.  You have a fever and your symptoms suddenly get worse. MAKE SURE YOU:   Understand these instructions.  Will watch your condition.  Will get help right away if you are not doing well or get worse.   This information is not intended to replace advice given to you by your health care provider. Make sure  you discuss any questions you have with your health care provider.   Document Released: 06/19/2005 Document Revised: 01/21/2012 Document Reviewed: 12/11/2011 Elsevier Interactive Patient Education 2016 Elsevier Inc.  Abdominal Pain, Adult Many things can cause belly (abdominal) pain. Most times, the belly pain is not dangerous. Many cases of belly pain can be watched and treated at home. HOME CARE   Do not take medicines that help you go poop (laxatives) unless told to by your doctor.  Only take medicine as told by your doctor.  Eat or drink as told by your doctor. Your doctor will tell you if you should be on a special diet. GET HELP IF:  You do not know what is causing your belly pain.  You have belly pain while you are sick to your stomach (nauseous) or have runny poop (diarrhea).  You have pain while you pee or poop.  Your belly pain wakes you up at night.  You have belly pain that gets worse or better when you eat.  You have belly pain that gets worse when you eat fatty foods.  You have a fever. GET HELP RIGHT AWAY IF:   The pain does not go away within 2 hours.  You keep throwing up (vomiting).  The pain changes and is only in the right or left part of the belly.  You have bloody or tarry looking poop.  MAKE SURE YOU:   Understand these instructions.  Will watch your condition.  Will get help right away if you are not doing well or get worse.   This information is not intended to replace advice given to you by your health care provider. Make sure you discuss any questions you have with your health care provider.   Document Released: 10/15/2007 Document Revised: 05/19/2014 Document Reviewed: 01/05/2013 Elsevier Interactive Patient Education [employer].

## 2015-08-18 NOTE — ED Provider Notes (Signed)
[facility]  ____________________________________________  Time seen: Approximately 1:06 PM  I have reviewed the triage vital signs and the nursing notes.   HISTORY  Chief Complaint Flank Pain   HPI [name] is a 70 y.o. female presents for the complaint of left flank and left abdominal pain 2-3 days. Patient reports gradually has worsened. Patient reports that she has had some accompanying nausea without vomiting. Denies diarrhea. Ports continues to drink fluids well but with slight decrease in appetite. Patient reports that she has had a history of kidney stones as well as urinary tract infections and she was concerned that she had one of the other.  Patient reports pain is constantly and left side of her abdomen and intermittently radiating to left flank. Patient reports that she does have some intermittent urinary frequency and urgency. Patient reports that yesterday and this morning she noticed her urine looked a little bit darker and states that she had some spotting on her underwear that likely bled from her urine. Denies any vaginal discharge or vaginal complaints. Reports total hysterectomy.  Patient reports that she is normally able to handle pain very well but states that pain today as a broader and. Patient reports that she has been taking home hydrocodone over the last 2 days which helps some but does not resolve the pain. Patient states that she was unable to eat today as she is been too nauseated. 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  Denies recent sickness. Denies recent fevers. Patient reports last hospitalization was approximately 2 months ago after obtaining a "blood clot"  cardiac cath. Denies recent antibiotic use. Also of note patient reports multiple abdominal surgeries. Reports bilateral oophorectomy post ovarian tumors, total hysterectomy, appendectomy, perforated colon during colonoscopy leading to colectomy in which patient  reports at that time she was septic with multiple JP drains.  PCP: Gilford Rile   Past Medical History  Diagnosis Date  . Foot fracture, right   . DVT (deep venous thrombosis) ([place])   . Diabetes mellitus   . History of back surgery 1980    X2  . Pneumonia   . Vitamin D deficiency   . Osteoporosis   . History of colon polyps   . Hyperlipidemia     Patient Active Problem List   Diagnosis Date Noted  . Acute bronchiolitis due to unspecified organism 04/18/2015  . Right leg pain 03/09/2015  . Coronary artery disease due to lipid rich plaque 03/09/2015  . Chest pain 02/20/2015  . DJD (degenerative joint disease), cervical 08/15/2014  . Right shoulder pain 07/03/2014  . Hypomagnesemia 03/13/2014  . Nephrolithiasis 10/10/2013  . Medicare annual wellness visit, subsequent 03/10/2013  . Postmenopausal estrogen deficiency 03/10/2013  . Hyperlipidemia 02/21/2013  . Diabetes mellitus type 2 in obese ([place]) 12/06/2012  . Right hip pain 09/06/2012  . Essential hypertension, benign 09/06/2012  . Morbid obesity ([place]) 01/09/2011  . Osteopenia 12/25/2010    Past Surgical History  Procedure Laterality Date  . Colectomy      for benign polyps  . Abdominal hysterectomy      total  . Arhtroscopy      left knee  . Cholecystectomy    . Appendectomy    . Cardiovascular stress test      abnormal, had cath which was normal  . Carpal tunnel release    . Cardiac catheterization      [place] [facility]      [facility]  Current Outpatient Rx  Name  Route  Sig  Dispense  Refill  . aspirin 325 MG tablet   Oral   Take 325 mg by mouth daily.         Marland Kitchen atorvastatin (LIPITOR) 20 MG tablet   Oral   Take 1 tablet (20 mg total) by mouth daily.   30 tablet   1   . glucose blood (FREESTYLE LITE) test strip      Check sugar 2-3 times daily Dx E10.69   300 each   3   . Insulin Detemir (LEVEMIR FLEXTOUCH) 100 UNIT/ML Pen   Subcutaneous   Inject 60 Units into the skin 2  (two) times daily.   40 pen   3   . magnesium oxide (MAG-OX) 400 MG tablet   Oral   Take 1 tablet (400 mg total) by mouth 2 (two) times daily.   60 tablet   1   . MECLIZINE HCL PO   Oral   Take by mouth.         . metFORMIN (GLUCOPHAGE) 1000 MG tablet      TAKE ONE TABLET BY MOUTH TWICE DAILY WITH MEALS   180 tablet   4   . nitroGLYCERIN (NITROSTAT) 0.4 MG SL tablet   Sublingual   Place 1 tablet (0.4 mg total) under the tongue every 5 (five) minutes as needed for chest pain.   30 tablet   6   . rivaroxaban (XARELTO) 20 MG TABS tablet   Oral   Take 20 mg by mouth daily with supper.         . cyanocobalamin (,VITAMIN B-12,) 1000 MCG/ML injection   Intramuscular   Inject 1,000 mcg into the muscle once.           Allergies Tramadol; Buprenex; Iodinated diagnostic agents; Lisinopril; Losartan; Sulfa drugs cross reactors; and Ventolin  Family History  Problem Relation Age of Onset  . Heart attack Father 12    MI  . Heart disease Father   . Arrhythmia Sister     pacemaker    Social History Social History  Substance Use Topics  . Smoking status: Never Smoker   . Smokeless tobacco: Never Used  . Alcohol Use: No    Review of Systems Constitutional: No fever/chills Eyes: No visual changes. ENT: No sore throat. Cardiovascular: Denies chest pain. Respiratory: Denies shortness of breath. Gastrointestinal: As above No diarrhea.  No constipation. Genitourinary: Positive for dysuria. Musculoskeletal: Negative for back pain. Skin: Negative for rash. Neurological: Negative for headaches, focal weakness or numbness.  10-point ROS otherwise negative.  ____________________________________________   PHYSICAL EXAM:  VITAL SIGNS: ED Triage Vitals  Enc Vitals Group     BP 08/18/15 1230 145/74 mmHg     Pulse Rate 08/18/15 1230 78     Resp 08/18/15 1230 16     Temp 08/18/15 1230 97.9 F (36.6 C)     Temp Source 08/18/15 1230 Oral     SpO2 08/18/15 1230  100 %     Weight 08/18/15 1230 225 lb (102.059 kg)     Height 08/18/15 1230 5\' 2"  (1.575 m)     Head Cir --      Peak Flow --      Pain Score 08/18/15 1233 10     Pain Loc --      Pain Edu? --      Excl. in [place]? --     Constitutional: Alert and oriented. Well appearing and in no acute  distress. Eyes: Conjunctivae are normal. PERRL. EOMI. Head: Atraumatic.  Nose: No congestion/rhinnorhea.  Mouth/Throat: Mucous membranes are moist.  Oropharynx non-erythematous. Neck: No stridor.  No cervical spine tenderness to palpation. Hematological/Lymphatic/Immunilogical: No cervical lymphadenopathy. Cardiovascular: Normal rate, regular rhythm. Grossly normal heart sounds.  Good peripheral circulation. Respiratory: Normal respiratory effort.  No retractions. Lungs CTAB.No wheezes, rales or rhonchi. Gastrointestinal: Moderate left CVA tenderness. Mild to moderate left upper, left lower and epigastric abdominal tenderness. Abdomen soft. Non-guarding. No suprapubic tenderness. Normal bowel sounds.    Musculoskeletal: No lower or upper extremity tenderness nor edema.  No joint effusions. Bilateral pedal pulses equal and easily palpated.  Neurologic:  Normal speech and language. No gross focal neurologic deficits are appreciated. No gait instability. Skin:  Skin is warm, dry and intact. No rash noted. Psychiatric: Mood and affect are normal. Speech and behavior are normal.  ____________________________________________   LABS (all labs ordered are listed, but only abnormal results are displayed)  Labs Reviewed  URINALYSIS COMPLETEWITH MICROSCOPIC ([facility] ONLY) - Abnormal; Notable for the following:    APPearance CLOUDY (*)    Glucose, UA 100 (*)    Hgb urine dipstick 3+ (*)    Protein, ur 30 (*)    Bacteria, UA RARE (*)    Squamous Epithelial / LPF 6-30 (*)    All other components within normal limits  CBC WITH DIFFERENTIAL/PLATELET - Abnormal; Notable for the following:    WBC 13.8 (*)    RDW  14.6 (*)    Neutro Abs 10.3 (*)    Monocytes Absolute 1.0 (*)    All other components within normal limits  BASIC METABOLIC PANEL - Abnormal; Notable for the following:    Sodium 132 (*)    Chloride 99 (*)    Glucose, Bld 211 (*)    Calcium 8.8 (*)    GFR calc non Af Amer 60 (*)    All other components within normal limits    RADIOLOGY EXAM: ABDOMEN - 1 VIEW  COMPARISON: CT, 10/10/2013  FINDINGS: There is no bowel dilation to suggest obstruction or significant generalized adynamic ileus.  There are bowel anastomosis staples along the right colon in.  Small calculus projects over the lower pole the right kidney. There are vascular calcifications along the aorta and iliac vessels. Soft tissues are otherwise unremarkable.  There changes from previous laminectomies in the lower lumbar spine.  IMPRESSION: 1. No acute findings. No evidence of bowel obstruction for diffuse adynamic ileus.   Electronically Signed By: Lajean Manes M.D. On: 08/18/2015 14:06  INITIAL IMPRESSION / ASSESSMENT AND PLAN / ED COURSE  Pertinent labs & imaging results that were available during my care of the patient were reviewed by me and considered in my medical decision making (see chart for details).  Overall well-appearing patient. Afebrile, vital signs stable. Smiling and laughing in room. Presents for the complaints of to 3 days of left flank pain and left abdominal pain. Per subjective report concern for UTI versus nephrolithiasis. Urinalysis positive for rare bacteria, too numerous to count RBCs, 6-30 squamous epithelial cells and 3+ hemoglobin with cloudy appearance. Will culture urinalysis. As of left flank pain and too numerous to count RBCs Will obtain KUB. No CT available at this facility at this time. Will also evaluate CBC and BMP. 8 mg ODT Zofran given orally 1 in urgent care.  Patient reports post Zofran nausea minimal now.WBC 13.8 with left shift. Otherwise labs reviewed.  Patient abdomen reexamined. Patient with continued moderate tenderness to left  flank, left upper and left lower and epigastric. Patient reports that she's had a history of left nephrolithiasis in the past but states that in the past with kidney stones pain was not this bad. As with report of increasing pain, extensive abdominal surgical history, elevated WBC and hematuria, recommend patient go to emergency room of her choice for further evaluation including likely CT abdomen. Suspect nephrolithiasis but concern for infection as well. States does not want to go by EMS. Patient daughter at bedside and states will drive patient to [facility]. Annie Main RN called report to [facility]. Patient stable the time of transfer and discharge.  Discussed follow up with Primary care physician this week. Discussed follow up and return parameters including no resolution or any worsening concerns. Patient verbalized understanding and agreed to plan.   ____________________________________________   FINAL CLINICAL IMPRESSION(S) / ED DIAGNOSES  Final diagnoses:  Left flank pain      Note: This dictation was prepared with Dragon dictation along with smaller phrase technology. Any transcriptional errors that result from this process are unintentional.    Marylene Land, NP 08/18/15 1512

## 2015-08-23 DIAGNOSIS — I2510 Atherosclerotic heart disease of native coronary artery without angina pectoris: Secondary | ICD-10-CM | POA: Diagnosis not present

## 2015-08-23 DIAGNOSIS — N1330 Unspecified hydronephrosis: Secondary | ICD-10-CM | POA: Diagnosis not present

## 2015-08-23 DIAGNOSIS — N132 Hydronephrosis with renal and ureteral calculous obstruction: Secondary | ICD-10-CM | POA: Diagnosis not present

## 2015-08-23 DIAGNOSIS — Z885 Allergy status to narcotic agent status: Secondary | ICD-10-CM | POA: Diagnosis not present

## 2015-08-23 DIAGNOSIS — Z86718 Personal history of other venous thrombosis and embolism: Secondary | ICD-10-CM | POA: Diagnosis not present

## 2015-08-23 DIAGNOSIS — Z8249 Family history of ischemic heart disease and other diseases of the circulatory system: Secondary | ICD-10-CM | POA: Diagnosis not present

## 2015-08-23 DIAGNOSIS — Z882 Allergy status to sulfonamides status: Secondary | ICD-10-CM | POA: Diagnosis not present

## 2015-08-23 DIAGNOSIS — E119 Type 2 diabetes mellitus without complications: Secondary | ICD-10-CM | POA: Diagnosis not present

## 2015-08-23 DIAGNOSIS — Z823 Family history of stroke: Secondary | ICD-10-CM | POA: Diagnosis not present

## 2015-08-23 DIAGNOSIS — R109 Unspecified abdominal pain: Secondary | ICD-10-CM | POA: Diagnosis not present

## 2015-08-23 DIAGNOSIS — R1084 Generalized abdominal pain: Secondary | ICD-10-CM | POA: Diagnosis not present

## 2015-08-23 DIAGNOSIS — Z79899 Other long term (current) drug therapy: Secondary | ICD-10-CM | POA: Diagnosis not present

## 2015-08-23 DIAGNOSIS — R112 Nausea with vomiting, unspecified: Secondary | ICD-10-CM | POA: Diagnosis not present

## 2015-08-23 DIAGNOSIS — Z794 Long term (current) use of insulin: Secondary | ICD-10-CM | POA: Diagnosis not present

## 2015-08-23 DIAGNOSIS — Z888 Allergy status to other drugs, medicaments and biological substances status: Secondary | ICD-10-CM | POA: Diagnosis not present

## 2015-08-28 DIAGNOSIS — N202 Calculus of kidney with calculus of ureter: Secondary | ICD-10-CM | POA: Diagnosis not present

## 2015-08-28 DIAGNOSIS — N201 Calculus of ureter: Secondary | ICD-10-CM | POA: Diagnosis not present

## 2015-08-28 DIAGNOSIS — N200 Calculus of kidney: Secondary | ICD-10-CM | POA: Diagnosis not present

## 2015-08-28 DIAGNOSIS — N23 Unspecified renal colic: Secondary | ICD-10-CM | POA: Diagnosis not present

## 2015-08-31 DIAGNOSIS — N2889 Other specified disorders of kidney and ureter: Secondary | ICD-10-CM | POA: Diagnosis not present

## 2015-08-31 DIAGNOSIS — N23 Unspecified renal colic: Secondary | ICD-10-CM | POA: Diagnosis not present

## 2015-08-31 DIAGNOSIS — N200 Calculus of kidney: Secondary | ICD-10-CM | POA: Diagnosis not present

## 2015-09-05 DIAGNOSIS — N200 Calculus of kidney: Secondary | ICD-10-CM | POA: Diagnosis not present

## 2015-09-05 DIAGNOSIS — E119 Type 2 diabetes mellitus without complications: Secondary | ICD-10-CM | POA: Diagnosis not present

## 2015-09-05 DIAGNOSIS — K219 Gastro-esophageal reflux disease without esophagitis: Secondary | ICD-10-CM | POA: Diagnosis not present

## 2015-09-05 DIAGNOSIS — Z794 Long term (current) use of insulin: Secondary | ICD-10-CM | POA: Diagnosis not present

## 2015-09-05 DIAGNOSIS — Z885 Allergy status to narcotic agent status: Secondary | ICD-10-CM | POA: Diagnosis not present

## 2015-09-05 DIAGNOSIS — Z01810 Encounter for preprocedural cardiovascular examination: Secondary | ICD-10-CM | POA: Diagnosis not present

## 2015-09-05 DIAGNOSIS — I2510 Atherosclerotic heart disease of native coronary artery without angina pectoris: Secondary | ICD-10-CM | POA: Diagnosis not present

## 2015-09-07 DIAGNOSIS — N200 Calculus of kidney: Secondary | ICD-10-CM | POA: Diagnosis not present

## 2015-09-17 DIAGNOSIS — N200 Calculus of kidney: Secondary | ICD-10-CM | POA: Diagnosis not present

## 2015-09-17 DIAGNOSIS — Z9889 Other specified postprocedural states: Secondary | ICD-10-CM | POA: Diagnosis not present

## 2015-09-17 DIAGNOSIS — M549 Dorsalgia, unspecified: Secondary | ICD-10-CM | POA: Diagnosis not present

## 2015-10-15 ENCOUNTER — Ambulatory Visit: Payer: Medicare Other | Admitting: Internal Medicine

## 2015-11-15 ENCOUNTER — Ambulatory Visit (INDEPENDENT_AMBULATORY_CARE_PROVIDER_SITE_OTHER): Payer: Medicare Other | Admitting: Internal Medicine

## 2015-11-15 ENCOUNTER — Encounter: Payer: Self-pay | Admitting: Internal Medicine

## 2015-11-15 VITALS — BP 144/70 | HR 71 | Ht 62.0 in | Wt 230.8 lb

## 2015-11-15 DIAGNOSIS — E669 Obesity, unspecified: Secondary | ICD-10-CM

## 2015-11-15 DIAGNOSIS — E119 Type 2 diabetes mellitus without complications: Secondary | ICD-10-CM

## 2015-11-15 DIAGNOSIS — E1169 Type 2 diabetes mellitus with other specified complication: Secondary | ICD-10-CM

## 2015-11-15 DIAGNOSIS — E538 Deficiency of other specified B group vitamins: Secondary | ICD-10-CM

## 2015-11-15 LAB — COMPREHENSIVE METABOLIC PANEL
ALBUMIN: 4.2 g/dL (ref 3.5–5.2)
ALK PHOS: 61 U/L (ref 39–117)
ALT: 14 U/L (ref 0–35)
AST: 17 U/L (ref 0–37)
BUN: 16 mg/dL (ref 6–23)
CALCIUM: 10 mg/dL (ref 8.4–10.5)
CHLORIDE: 103 meq/L (ref 96–112)
CO2: 27 mEq/L (ref 19–32)
Creatinine, Ser: 0.74 mg/dL (ref 0.40–1.20)
GFR: 82.44 mL/min (ref >60.00)
Glucose, Bld: 109 mg/dL — ABNORMAL HIGH (ref 70–99)
POTASSIUM: 4.3 meq/L (ref 3.5–5.1)
Sodium: 140 mEq/L (ref 135–145)
Total Bilirubin: 0.4 mg/dL (ref 0.2–1.2)
Total Protein: 7 g/dL (ref 6.0–8.3)

## 2015-11-15 LAB — HEMOGLOBIN A1C: HEMOGLOBIN A1C: 8.5 % — AB (ref 4.6–6.5)

## 2015-11-15 LAB — MICROALBUMIN / CREATININE URINE RATIO
Creatinine,U: 242.3 mg/dL
MICROALB UR: 5 mg/dL — AB (ref 0.0–1.9)
MICROALB/CREAT RATIO: 2.1 mg/g (ref 0.0–30.0)

## 2015-11-15 MED ORDER — CYANOCOBALAMIN 1000 MCG/ML IJ SOLN
1000.0000 ug | Freq: Once | INTRAMUSCULAR | Status: AC
  Administered 2015-11-15: 1000 ug via INTRAMUSCULAR

## 2015-11-15 NOTE — Assessment & Plan Note (Signed)
Will check A1c with labs. Continue Levemir and Metformin.

## 2015-11-15 NOTE — Patient Instructions (Signed)
Labs today.   Follow up in 3 months.  

## 2015-11-15 NOTE — Assessment & Plan Note (Signed)
Encouraged healthy diet and physical activity. Discussed realistic expectations given her current difficult living situation. Discussed that Vyvanse would not be safe for her given CAD history and she does not have diagnosis of binge eating disorder.

## 2015-11-15 NOTE — Progress Notes (Signed)
Pre visit review using our clinic review tool, if applicable. No additional management support is needed unless otherwise documented below in the visit note. 

## 2015-11-15 NOTE — Progress Notes (Signed)
Subjective:    Patient ID: [name], female    DOB: 10-19-1945, 70 y.o.   MRN: [id number]  HPI  70YO female presents for follow up.   DM - BG have been more elevated recently. Compliant with medications.  Difficult time for her. Husband is at home and refuses skilled nursing. He is incontinent and has difficulty ambulating. She is providing care for him.  She is frustrated by her weight. A friend was started on Vyvanse and she questions whether she might benefit from this. She is not following any diet or exercise program.  Wt Readings from Last 3 Encounters:  11/15/15 230 lb 12.8 oz (104.69 kg)  08/18/15 225 lb (102.059 kg)  07/13/15 229 lb (103.874 kg)   BP Readings from Last 3 Encounters:  11/15/15 144/70  08/18/15 145/74  07/13/15 133/79    Past Medical History  Diagnosis Date  . Foot fracture, right   . DVT (deep venous thrombosis) ([place])   . Diabetes mellitus   . History of back surgery 1980    X2  . Pneumonia   . Vitamin D deficiency   . Osteoporosis   . History of colon polyps   . Hyperlipidemia    Family History  Problem Relation Age of Onset  . Heart attack Father 86    MI  . Heart disease Father   . Arrhythmia Sister     pacemaker   Past Surgical History  Procedure Laterality Date  . Colectomy      for benign polyps  . Abdominal hysterectomy      total  . Arhtroscopy      left knee  . Cholecystectomy    . Appendectomy    . Cardiovascular stress test      abnormal, had cath which was normal  . Carpal tunnel release    . Cardiac catheterization      [facility]   . Glaucoma surgery      [facility]   Social History   Social History  . Marital Status: Married    Spouse Name: N/A  . Number of Children: N/A  . Years of Education: N/A   Social History Main Topics  . Smoking status: Never Smoker   . Smokeless tobacco: Never Used  . Alcohol Use: No  . Drug Use: No  . Sexual Activity: Not Asked   Other Topics Concern  . None     Social History Narrative    Review of Systems  Constitutional: Positive for fatigue. Negative for fever, chills, appetite change and unexpected weight change.  Eyes: Negative for visual disturbance.  Respiratory: Negative for shortness of breath.   Cardiovascular: Negative for chest pain and leg swelling.  Gastrointestinal: Negative for nausea, vomiting, abdominal pain, diarrhea and constipation.  Musculoskeletal: Negative for myalgias and arthralgias.  Skin: Negative for color change and rash.  Hematological: Negative for adenopathy. Does not bruise/bleed easily.  Psychiatric/Behavioral: Positive for dysphoric mood. Negative for suicidal ideas and sleep disturbance. The patient is nervous/anxious.        Objective:    BP 144/70 mmHg  Pulse 71  Ht 5\' 2"  (1.575 m)  Wt 230 lb 12.8 oz (104.69 kg)  BMI 42.20 kg/m2  SpO2 97% Physical Exam  Constitutional: She is oriented to person, place, and time. She appears well-developed and well-nourished. No distress.  HENT:  Head: Normocephalic and atraumatic.  Right Ear: External ear normal.  Left Ear: External ear normal.  Nose: Nose normal.  Mouth/Throat: Oropharynx is  clear and moist. No oropharyngeal exudate.  Eyes: Conjunctivae are normal. Pupils are equal, round, and reactive to light. Right eye exhibits no discharge. Left eye exhibits no discharge. No scleral icterus.  Neck: Normal range of motion. Neck supple. No tracheal deviation present. No thyromegaly present.  Cardiovascular: Normal rate, regular rhythm, normal heart sounds and intact distal pulses.  Exam reveals no gallop and no friction rub.   No murmur heard. Pulmonary/Chest: Effort normal and breath sounds normal. No respiratory distress. She has no wheezes. She has no rales. She exhibits no tenderness.  Musculoskeletal: Normal range of motion. She exhibits no edema or tenderness.  Lymphadenopathy:    She has no cervical adenopathy.  Neurological: She is alert and  oriented to person, place, and time. No cranial nerve deficit. She exhibits normal muscle tone. Coordination normal.  Skin: Skin is warm and dry. No rash noted. She is not diaphoretic. No erythema. No pallor.  Psychiatric: She has a normal mood and affect. Her behavior is normal. Judgment and thought content normal.          Assessment & Plan:  Over 52min of which >50% spent in face-to-face contact with patient discussing plan of care  Problem List Items Addressed This Visit      High   Diabetes mellitus type 2 in obese ([place]) - Primary (Chronic)    Will check A1c with labs. Continue Levemir and Metformin.      Relevant Orders   Comprehensive metabolic panel   Hemoglobin A1c   Microalbumin / creatinine urine ratio   Morbid obesity (H[facility]) (Chronic)    Encouraged healthy diet and physical activity. Discussed realistic expectations given her current difficult living situation. Discussed that Vyvanse would not be safe for her given CAD history and she does not have diagnosis of binge eating disorder.       Other Visit Diagnoses    B12 deficiency        Relevant Medications    cyanocobalamin ((VITAMIN B-12)) injection 1,000 mcg (Completed)        Return in about 3 months (around 02/15/2016) for New Patient.  Ronette Deter, MD Internal Medicine [facility]

## 2015-11-29 ENCOUNTER — Telehealth: Payer: Self-pay | Admitting: *Deleted

## 2015-11-29 DIAGNOSIS — E1065 Type 1 diabetes mellitus with hyperglycemia: Secondary | ICD-10-CM

## 2015-11-29 DIAGNOSIS — E108 Type 1 diabetes mellitus with unspecified complications: Principal | ICD-10-CM

## 2015-11-29 DIAGNOSIS — IMO0002 Reserved for concepts with insufficient information to code with codable children: Secondary | ICD-10-CM

## 2015-11-29 MED ORDER — INSULIN DETEMIR 100 UNIT/ML FLEXPEN: 60.0000 [IU] | PEN_INJECTOR | Freq: Two times a day (BID) | SUBCUTANEOUS | Status: AC

## 2015-11-29 NOTE — Telephone Encounter (Signed)
rx sent

## 2015-11-29 NOTE — Telephone Encounter (Signed)
Patient requested a mediation refill for Levemir flextough 65 units  90 day supply  Pharmacy [facility]

## 2016-02-01 DIAGNOSIS — H40003 Preglaucoma, unspecified, bilateral: Secondary | ICD-10-CM | POA: Diagnosis not present

## 2016-02-15 ENCOUNTER — Ambulatory Visit: Payer: Medicare Other | Admitting: Family Medicine

## 2016-03-11 DIAGNOSIS — Z7982 Long term (current) use of aspirin: Secondary | ICD-10-CM | POA: Diagnosis not present

## 2016-03-11 DIAGNOSIS — E1142 Type 2 diabetes mellitus with diabetic polyneuropathy: Secondary | ICD-10-CM | POA: Diagnosis not present

## 2016-03-11 DIAGNOSIS — Z9289 Personal history of other medical treatment: Secondary | ICD-10-CM | POA: Diagnosis not present

## 2016-03-11 DIAGNOSIS — Z636 Dependent relative needing care at home: Secondary | ICD-10-CM | POA: Diagnosis not present

## 2016-03-11 DIAGNOSIS — E559 Vitamin D deficiency, unspecified: Secondary | ICD-10-CM | POA: Diagnosis not present

## 2016-03-11 DIAGNOSIS — E538 Deficiency of other specified B group vitamins: Secondary | ICD-10-CM | POA: Diagnosis not present

## 2016-03-11 DIAGNOSIS — Z794 Long term (current) use of insulin: Secondary | ICD-10-CM | POA: Diagnosis not present

## 2016-03-11 DIAGNOSIS — I25119 Atherosclerotic heart disease of native coronary artery with unspecified angina pectoris: Secondary | ICD-10-CM | POA: Diagnosis not present

## 2016-03-11 DIAGNOSIS — Z79899 Other long term (current) drug therapy: Secondary | ICD-10-CM | POA: Diagnosis not present

## 2016-03-11 DIAGNOSIS — K219 Gastro-esophageal reflux disease without esophagitis: Secondary | ICD-10-CM | POA: Diagnosis not present

## 2016-03-11 DIAGNOSIS — Z91041 Radiographic dye allergy status: Secondary | ICD-10-CM | POA: Diagnosis not present

## 2016-03-11 DIAGNOSIS — N200 Calculus of kidney: Secondary | ICD-10-CM | POA: Diagnosis not present

## 2016-03-11 DIAGNOSIS — Z86718 Personal history of other venous thrombosis and embolism: Secondary | ICD-10-CM | POA: Diagnosis not present

## 2016-03-11 DIAGNOSIS — I25118 Atherosclerotic heart disease of native coronary artery with other forms of angina pectoris: Secondary | ICD-10-CM | POA: Diagnosis not present

## 2016-03-11 DIAGNOSIS — R42 Dizziness and giddiness: Secondary | ICD-10-CM | POA: Diagnosis not present

## 2016-03-11 DIAGNOSIS — Z885 Allergy status to narcotic agent status: Secondary | ICD-10-CM | POA: Diagnosis not present

## 2016-03-11 DIAGNOSIS — Z888 Allergy status to other drugs, medicaments and biological substances status: Secondary | ICD-10-CM | POA: Diagnosis not present

## 2016-03-11 DIAGNOSIS — R079 Chest pain, unspecified: Secondary | ICD-10-CM | POA: Diagnosis not present

## 2016-03-11 DIAGNOSIS — Z9049 Acquired absence of other specified parts of digestive tract: Secondary | ICD-10-CM | POA: Diagnosis not present

## 2016-03-11 DIAGNOSIS — Z882 Allergy status to sulfonamides status: Secondary | ICD-10-CM | POA: Diagnosis not present

## 2016-03-11 DIAGNOSIS — R030 Elevated blood-pressure reading, without diagnosis of hypertension: Secondary | ICD-10-CM | POA: Diagnosis not present

## 2016-03-11 DIAGNOSIS — H401130 Primary open-angle glaucoma, bilateral, stage unspecified: Secondary | ICD-10-CM | POA: Diagnosis not present

## 2016-03-11 DIAGNOSIS — Z23 Encounter for immunization: Secondary | ICD-10-CM | POA: Diagnosis not present

## 2016-05-13 DIAGNOSIS — H40003 Preglaucoma, unspecified, bilateral: Secondary | ICD-10-CM | POA: Diagnosis not present

## 2016-06-11 DIAGNOSIS — Z79899 Other long term (current) drug therapy: Secondary | ICD-10-CM | POA: Diagnosis not present

## 2016-06-11 DIAGNOSIS — Z636 Dependent relative needing care at home: Secondary | ICD-10-CM | POA: Diagnosis not present

## 2016-06-11 DIAGNOSIS — Z7982 Long term (current) use of aspirin: Secondary | ICD-10-CM | POA: Diagnosis not present

## 2016-06-11 DIAGNOSIS — Z885 Allergy status to narcotic agent status: Secondary | ICD-10-CM | POA: Diagnosis not present

## 2016-06-11 DIAGNOSIS — G8929 Other chronic pain: Secondary | ICD-10-CM | POA: Diagnosis not present

## 2016-06-11 DIAGNOSIS — Z86718 Personal history of other venous thrombosis and embolism: Secondary | ICD-10-CM | POA: Diagnosis not present

## 2016-06-11 DIAGNOSIS — Z882 Allergy status to sulfonamides status: Secondary | ICD-10-CM | POA: Diagnosis not present

## 2016-06-11 DIAGNOSIS — Z9111 Patient's noncompliance with dietary regimen: Secondary | ICD-10-CM | POA: Diagnosis not present

## 2016-06-11 DIAGNOSIS — R030 Elevated blood-pressure reading, without diagnosis of hypertension: Secondary | ICD-10-CM | POA: Diagnosis not present

## 2016-06-11 DIAGNOSIS — M545 Low back pain: Secondary | ICD-10-CM | POA: Diagnosis not present

## 2016-06-11 DIAGNOSIS — Z794 Long term (current) use of insulin: Secondary | ICD-10-CM | POA: Diagnosis not present

## 2016-06-11 DIAGNOSIS — Z9119 Patient's noncompliance with other medical treatment and regimen: Secondary | ICD-10-CM | POA: Diagnosis not present

## 2016-06-11 DIAGNOSIS — Z888 Allergy status to other drugs, medicaments and biological substances status: Secondary | ICD-10-CM | POA: Diagnosis not present

## 2016-06-11 DIAGNOSIS — F09 Unspecified mental disorder due to known physiological condition: Secondary | ICD-10-CM | POA: Diagnosis not present

## 2016-06-11 DIAGNOSIS — E1142 Type 2 diabetes mellitus with diabetic polyneuropathy: Secondary | ICD-10-CM | POA: Diagnosis not present

## 2016-06-11 DIAGNOSIS — Z1231 Encounter for screening mammogram for malignant neoplasm of breast: Secondary | ICD-10-CM | POA: Diagnosis not present

## 2016-06-11 DIAGNOSIS — I25118 Atherosclerotic heart disease of native coronary artery with other forms of angina pectoris: Secondary | ICD-10-CM | POA: Diagnosis not present

## 2016-06-11 DIAGNOSIS — F320 Major depressive disorder, single episode, mild: Secondary | ICD-10-CM | POA: Diagnosis not present

## 2016-06-11 DIAGNOSIS — F329 Major depressive disorder, single episode, unspecified: Secondary | ICD-10-CM | POA: Diagnosis not present

## 2016-06-25 DIAGNOSIS — R9431 Abnormal electrocardiogram [ECG] [EKG]: Secondary | ICD-10-CM | POA: Diagnosis not present

## 2016-06-25 DIAGNOSIS — I25118 Atherosclerotic heart disease of native coronary artery with other forms of angina pectoris: Secondary | ICD-10-CM | POA: Diagnosis not present

## 2016-06-25 DIAGNOSIS — I493 Ventricular premature depolarization: Secondary | ICD-10-CM | POA: Diagnosis not present

## 2016-06-25 DIAGNOSIS — I498 Other specified cardiac arrhythmias: Secondary | ICD-10-CM | POA: Diagnosis not present

## 2016-06-30 ENCOUNTER — Telehealth: Payer: Self-pay

## 2016-06-30 NOTE — Telephone Encounter (Signed)
Has not established care, no appointment scheduled. Last filled by Dr.Walker 04/12/15 180 4rf. Called patient, she states she is now seeing Dr.Dale

## 2016-07-04 ENCOUNTER — Other Ambulatory Visit: Payer: Self-pay | Admitting: Internal Medicine

## 2016-07-04 DIAGNOSIS — Z1231 Encounter for screening mammogram for malignant neoplasm of breast: Secondary | ICD-10-CM

## 2016-07-15 ENCOUNTER — Ambulatory Visit
Admission: RE | Admit: 2016-07-15 | Discharge: 2016-07-15 | Disposition: A | Discharge status: Home / Self Care | Payer: Medicare Other | Source: Ambulatory Visit | Attending: Internal Medicine | Admitting: Internal Medicine

## 2016-07-15 ENCOUNTER — Other Ambulatory Visit: Payer: Self-pay | Admitting: Internal Medicine

## 2016-07-15 DIAGNOSIS — R928 Other abnormal and inconclusive findings on diagnostic imaging of breast: Secondary | ICD-10-CM | POA: Diagnosis not present

## 2016-07-15 DIAGNOSIS — Z1231 Encounter for screening mammogram for malignant neoplasm of breast: Secondary | ICD-10-CM

## 2016-07-22 ENCOUNTER — Other Ambulatory Visit: Payer: Self-pay | Admitting: Internal Medicine

## 2016-07-22 DIAGNOSIS — R928 Other abnormal and inconclusive findings on diagnostic imaging of breast: Secondary | ICD-10-CM

## 2016-07-22 DIAGNOSIS — N6489 Other specified disorders of breast: Secondary | ICD-10-CM

## 2016-07-29 ENCOUNTER — Ambulatory Visit
Admission: RE | Admit: 2016-07-29 | Discharge: 2016-07-29 | Disposition: A | Discharge status: Home / Self Care | Payer: Medicare Other | Source: Ambulatory Visit | Attending: Internal Medicine | Admitting: Internal Medicine

## 2016-07-29 DIAGNOSIS — N6489 Other specified disorders of breast: Secondary | ICD-10-CM | POA: Diagnosis not present

## 2016-07-29 DIAGNOSIS — R928 Other abnormal and inconclusive findings on diagnostic imaging of breast: Secondary | ICD-10-CM

## 2016-07-29 DIAGNOSIS — N6310 Unspecified lump in the right breast, unspecified quadrant: Secondary | ICD-10-CM | POA: Insufficient documentation

## 2016-08-26 DIAGNOSIS — I25118 Atherosclerotic heart disease of native coronary artery with other forms of angina pectoris: Secondary | ICD-10-CM | POA: Diagnosis not present

## 2016-08-26 DIAGNOSIS — Z7984 Long term (current) use of oral hypoglycemic drugs: Secondary | ICD-10-CM | POA: Diagnosis not present

## 2016-08-26 DIAGNOSIS — Z6379 Other stressful life events affecting family and household: Secondary | ICD-10-CM | POA: Diagnosis not present

## 2016-08-26 DIAGNOSIS — Z86718 Personal history of other venous thrombosis and embolism: Secondary | ICD-10-CM | POA: Diagnosis not present

## 2016-08-26 DIAGNOSIS — R030 Elevated blood-pressure reading, without diagnosis of hypertension: Secondary | ICD-10-CM | POA: Diagnosis not present

## 2016-08-26 DIAGNOSIS — E1142 Type 2 diabetes mellitus with diabetic polyneuropathy: Secondary | ICD-10-CM | POA: Diagnosis not present

## 2016-08-26 DIAGNOSIS — Z636 Dependent relative needing care at home: Secondary | ICD-10-CM | POA: Diagnosis not present

## 2016-08-26 DIAGNOSIS — F320 Major depressive disorder, single episode, mild: Secondary | ICD-10-CM | POA: Diagnosis not present

## 2016-08-26 DIAGNOSIS — Z888 Allergy status to other drugs, medicaments and biological substances status: Secondary | ICD-10-CM | POA: Diagnosis not present

## 2016-08-26 DIAGNOSIS — F329 Major depressive disorder, single episode, unspecified: Secondary | ICD-10-CM | POA: Diagnosis not present

## 2016-08-26 DIAGNOSIS — Z794 Long term (current) use of insulin: Secondary | ICD-10-CM | POA: Diagnosis not present

## 2016-08-26 DIAGNOSIS — Z7982 Long term (current) use of aspirin: Secondary | ICD-10-CM | POA: Diagnosis not present

## 2016-08-26 DIAGNOSIS — Z882 Allergy status to sulfonamides status: Secondary | ICD-10-CM | POA: Diagnosis not present

## 2016-09-23 DIAGNOSIS — R002 Palpitations: Secondary | ICD-10-CM | POA: Diagnosis not present

## 2016-09-23 DIAGNOSIS — I25118 Atherosclerotic heart disease of native coronary artery with other forms of angina pectoris: Secondary | ICD-10-CM | POA: Diagnosis not present

## 2016-10-07 ENCOUNTER — Other Ambulatory Visit: Payer: Self-pay | Admitting: Otolaryngology

## 2016-10-07 DIAGNOSIS — H9319 Tinnitus, unspecified ear: Secondary | ICD-10-CM

## 2016-10-07 DIAGNOSIS — H93A1 Pulsatile tinnitus, right ear: Secondary | ICD-10-CM | POA: Diagnosis not present

## 2016-10-20 ENCOUNTER — Ambulatory Visit
Admission: RE | Admit: 2016-10-20 | Discharge: 2016-10-20 | Disposition: A | Discharge status: Home / Self Care | Payer: Medicare Other | Source: Ambulatory Visit | Attending: Otolaryngology | Admitting: Otolaryngology

## 2016-10-20 DIAGNOSIS — J929 Pleural plaque without asbestos: Secondary | ICD-10-CM | POA: Diagnosis not present

## 2016-10-20 DIAGNOSIS — H9319 Tinnitus, unspecified ear: Secondary | ICD-10-CM | POA: Insufficient documentation

## 2016-10-20 DIAGNOSIS — I6523 Occlusion and stenosis of bilateral carotid arteries: Secondary | ICD-10-CM | POA: Diagnosis not present

## 2016-10-23 ENCOUNTER — Other Ambulatory Visit: Payer: Self-pay | Admitting: Otolaryngology

## 2016-10-23 DIAGNOSIS — H93A1 Pulsatile tinnitus, right ear: Secondary | ICD-10-CM

## 2016-10-24 DIAGNOSIS — H40003 Preglaucoma, unspecified, bilateral: Secondary | ICD-10-CM | POA: Diagnosis not present

## 2016-11-05 ENCOUNTER — Other Ambulatory Visit
Admission: RE | Admit: 2016-11-05 | Discharge: 2016-11-05 | Disposition: A | Discharge status: Home / Self Care | Payer: Medicare Other | Source: Ambulatory Visit | Attending: Otolaryngology | Admitting: Otolaryngology

## 2016-11-05 ENCOUNTER — Ambulatory Visit
Admission: RE | Admit: 2016-11-05 | Discharge: 2016-11-05 | Disposition: A | Discharge status: Home / Self Care | Payer: Medicare Other | Source: Ambulatory Visit | Attending: Otolaryngology | Admitting: Otolaryngology

## 2016-11-05 DIAGNOSIS — H93A1 Pulsatile tinnitus, right ear: Secondary | ICD-10-CM

## 2016-11-05 LAB — CREATININE, SERUM
CREATININE: 0.78 mg/dL (ref 0.44–1.00)
GFR calc Af Amer: >60 mL/min (ref >60)

## 2016-11-05 MED ORDER — GADOBENATE DIMEGLUMINE 529 MG/ML IV SOLN
20.0000 mL | Freq: Once | INTRAVENOUS | Status: AC | PRN
  Administered 2016-11-05: 19 mL via INTRAVENOUS

## 2016-11-07 DIAGNOSIS — H93A1 Pulsatile tinnitus, right ear: Secondary | ICD-10-CM | POA: Diagnosis not present

## 2016-11-07 DIAGNOSIS — H903 Sensorineural hearing loss, bilateral: Secondary | ICD-10-CM | POA: Diagnosis not present

## 2016-11-07 DIAGNOSIS — J310 Chronic rhinitis: Secondary | ICD-10-CM | POA: Diagnosis not present

## 2016-11-07 DIAGNOSIS — H6063 Unspecified chronic otitis externa, bilateral: Secondary | ICD-10-CM | POA: Diagnosis not present

## 2016-11-25 DIAGNOSIS — Z882 Allergy status to sulfonamides status: Secondary | ICD-10-CM | POA: Diagnosis not present

## 2016-11-25 DIAGNOSIS — G8929 Other chronic pain: Secondary | ICD-10-CM | POA: Diagnosis not present

## 2016-11-25 DIAGNOSIS — I25119 Atherosclerotic heart disease of native coronary artery with unspecified angina pectoris: Secondary | ICD-10-CM | POA: Diagnosis not present

## 2016-11-25 DIAGNOSIS — E1142 Type 2 diabetes mellitus with diabetic polyneuropathy: Secondary | ICD-10-CM | POA: Diagnosis not present

## 2016-11-25 DIAGNOSIS — Z86718 Personal history of other venous thrombosis and embolism: Secondary | ICD-10-CM | POA: Diagnosis not present

## 2016-11-25 DIAGNOSIS — Z7984 Long term (current) use of oral hypoglycemic drugs: Secondary | ICD-10-CM | POA: Diagnosis not present

## 2016-11-25 DIAGNOSIS — Z794 Long term (current) use of insulin: Secondary | ICD-10-CM | POA: Diagnosis not present

## 2016-11-25 DIAGNOSIS — M25511 Pain in right shoulder: Secondary | ICD-10-CM | POA: Diagnosis not present

## 2016-11-25 DIAGNOSIS — I25118 Atherosclerotic heart disease of native coronary artery with other forms of angina pectoris: Secondary | ICD-10-CM | POA: Diagnosis not present

## 2016-11-25 DIAGNOSIS — R5383 Other fatigue: Secondary | ICD-10-CM | POA: Diagnosis not present

## 2016-11-25 DIAGNOSIS — K219 Gastro-esophageal reflux disease without esophagitis: Secondary | ICD-10-CM | POA: Diagnosis not present

## 2016-11-25 DIAGNOSIS — Z636 Dependent relative needing care at home: Secondary | ICD-10-CM | POA: Diagnosis not present

## 2016-11-25 DIAGNOSIS — R030 Elevated blood-pressure reading, without diagnosis of hypertension: Secondary | ICD-10-CM | POA: Diagnosis not present

## 2016-11-25 DIAGNOSIS — Z7982 Long term (current) use of aspirin: Secondary | ICD-10-CM | POA: Diagnosis not present

## 2016-12-08 DIAGNOSIS — M25511 Pain in right shoulder: Secondary | ICD-10-CM | POA: Diagnosis not present

## 2016-12-08 DIAGNOSIS — M5013 Cervical disc disorder with radiculopathy, cervicothoracic region: Secondary | ICD-10-CM | POA: Diagnosis not present

## 2016-12-12 DIAGNOSIS — M25511 Pain in right shoulder: Secondary | ICD-10-CM | POA: Diagnosis not present

## 2016-12-12 DIAGNOSIS — M5013 Cervical disc disorder with radiculopathy, cervicothoracic region: Secondary | ICD-10-CM | POA: Diagnosis not present

## 2016-12-18 DIAGNOSIS — R002 Palpitations: Secondary | ICD-10-CM | POA: Diagnosis not present

## 2016-12-18 DIAGNOSIS — I25118 Atherosclerotic heart disease of native coronary artery with other forms of angina pectoris: Secondary | ICD-10-CM | POA: Diagnosis not present

## 2016-12-19 DIAGNOSIS — M5013 Cervical disc disorder with radiculopathy, cervicothoracic region: Secondary | ICD-10-CM | POA: Diagnosis not present

## 2016-12-19 DIAGNOSIS — M25511 Pain in right shoulder: Secondary | ICD-10-CM | POA: Diagnosis not present

## 2016-12-22 DIAGNOSIS — M5013 Cervical disc disorder with radiculopathy, cervicothoracic region: Secondary | ICD-10-CM | POA: Diagnosis not present

## 2016-12-22 DIAGNOSIS — M25511 Pain in right shoulder: Secondary | ICD-10-CM | POA: Diagnosis not present

## 2016-12-30 DIAGNOSIS — M5013 Cervical disc disorder with radiculopathy, cervicothoracic region: Secondary | ICD-10-CM | POA: Diagnosis not present

## 2016-12-30 DIAGNOSIS — M25511 Pain in right shoulder: Secondary | ICD-10-CM | POA: Diagnosis not present

## 2017-01-01 DIAGNOSIS — M5013 Cervical disc disorder with radiculopathy, cervicothoracic region: Secondary | ICD-10-CM | POA: Diagnosis not present

## 2017-01-01 DIAGNOSIS — M25511 Pain in right shoulder: Secondary | ICD-10-CM | POA: Diagnosis not present

## 2017-01-06 DIAGNOSIS — M5013 Cervical disc disorder with radiculopathy, cervicothoracic region: Secondary | ICD-10-CM | POA: Diagnosis not present

## 2017-01-06 DIAGNOSIS — M25511 Pain in right shoulder: Secondary | ICD-10-CM | POA: Diagnosis not present

## 2017-01-07 DIAGNOSIS — M25511 Pain in right shoulder: Secondary | ICD-10-CM | POA: Diagnosis not present

## 2017-01-07 DIAGNOSIS — I82409 Acute embolism and thrombosis of unspecified deep veins of unspecified lower extremity: Secondary | ICD-10-CM | POA: Diagnosis not present

## 2017-01-07 DIAGNOSIS — K219 Gastro-esophageal reflux disease without esophagitis: Secondary | ICD-10-CM | POA: Diagnosis not present

## 2017-01-07 DIAGNOSIS — S46001A Unspecified injury of muscle(s) and tendon(s) of the rotator cuff of right shoulder, initial encounter: Secondary | ICD-10-CM | POA: Diagnosis not present

## 2017-01-07 DIAGNOSIS — M2550 Pain in unspecified joint: Secondary | ICD-10-CM | POA: Diagnosis not present

## 2017-01-07 DIAGNOSIS — E119 Type 2 diabetes mellitus without complications: Secondary | ICD-10-CM | POA: Diagnosis not present

## 2017-01-07 DIAGNOSIS — I2510 Atherosclerotic heart disease of native coronary artery without angina pectoris: Secondary | ICD-10-CM | POA: Diagnosis not present

## 2017-01-07 DIAGNOSIS — M19011 Primary osteoarthritis, right shoulder: Secondary | ICD-10-CM | POA: Diagnosis not present

## 2017-01-07 DIAGNOSIS — M5126 Other intervertebral disc displacement, lumbar region: Secondary | ICD-10-CM | POA: Diagnosis not present

## 2017-01-07 DIAGNOSIS — N200 Calculus of kidney: Secondary | ICD-10-CM | POA: Diagnosis not present

## 2017-01-07 DIAGNOSIS — G5600 Carpal tunnel syndrome, unspecified upper limb: Secondary | ICD-10-CM | POA: Diagnosis not present

## 2017-02-27 DIAGNOSIS — E1142 Type 2 diabetes mellitus with diabetic polyneuropathy: Secondary | ICD-10-CM | POA: Diagnosis not present

## 2017-02-27 DIAGNOSIS — Z794 Long term (current) use of insulin: Secondary | ICD-10-CM | POA: Diagnosis not present

## 2017-03-04 DIAGNOSIS — E1142 Type 2 diabetes mellitus with diabetic polyneuropathy: Secondary | ICD-10-CM | POA: Diagnosis not present

## 2017-03-04 DIAGNOSIS — I25118 Atherosclerotic heart disease of native coronary artery with other forms of angina pectoris: Secondary | ICD-10-CM | POA: Diagnosis not present

## 2017-03-04 DIAGNOSIS — Z7984 Long term (current) use of oral hypoglycemic drugs: Secondary | ICD-10-CM | POA: Diagnosis not present

## 2017-03-04 DIAGNOSIS — Z86718 Personal history of other venous thrombosis and embolism: Secondary | ICD-10-CM | POA: Diagnosis not present

## 2017-03-04 DIAGNOSIS — Z636 Dependent relative needing care at home: Secondary | ICD-10-CM | POA: Diagnosis not present

## 2017-03-04 DIAGNOSIS — Z794 Long term (current) use of insulin: Secondary | ICD-10-CM | POA: Diagnosis not present

## 2017-03-04 DIAGNOSIS — F320 Major depressive disorder, single episode, mild: Secondary | ICD-10-CM | POA: Diagnosis not present

## 2017-03-04 DIAGNOSIS — Z882 Allergy status to sulfonamides status: Secondary | ICD-10-CM | POA: Diagnosis not present

## 2017-03-04 DIAGNOSIS — R030 Elevated blood-pressure reading, without diagnosis of hypertension: Secondary | ICD-10-CM | POA: Diagnosis not present

## 2017-03-04 DIAGNOSIS — Z23 Encounter for immunization: Secondary | ICD-10-CM | POA: Diagnosis not present

## 2017-03-04 DIAGNOSIS — K219 Gastro-esophageal reflux disease without esophagitis: Secondary | ICD-10-CM | POA: Diagnosis not present

## 2017-03-04 DIAGNOSIS — F329 Major depressive disorder, single episode, unspecified: Secondary | ICD-10-CM | POA: Diagnosis not present

## 2017-04-09 DIAGNOSIS — S46001D Unspecified injury of muscle(s) and tendon(s) of the rotator cuff of right shoulder, subsequent encounter: Secondary | ICD-10-CM | POA: Diagnosis not present

## 2017-04-24 DIAGNOSIS — E119 Type 2 diabetes mellitus without complications: Secondary | ICD-10-CM | POA: Diagnosis not present

## 2017-11-15 ENCOUNTER — Encounter: Payer: Self-pay | Admitting: Gynecology

## 2017-11-15 ENCOUNTER — Other Ambulatory Visit: Payer: Self-pay

## 2017-11-15 ENCOUNTER — Ambulatory Visit
Admission: EM | Admit: 2017-11-15 | Discharge: 2017-11-15 | Disposition: A | Discharge status: Home / Self Care | Payer: Medicare Other | Attending: Family Medicine | Admitting: Family Medicine

## 2017-11-15 ENCOUNTER — Ambulatory Visit: Payer: Medicare Other

## 2017-11-15 DIAGNOSIS — J069 Acute upper respiratory infection, unspecified: Secondary | ICD-10-CM | POA: Insufficient documentation

## 2017-11-15 DIAGNOSIS — Z7982 Long term (current) use of aspirin: Secondary | ICD-10-CM | POA: Diagnosis not present

## 2017-11-15 DIAGNOSIS — R0989 Other specified symptoms and signs involving the circulatory and respiratory systems: Secondary | ICD-10-CM

## 2017-11-15 DIAGNOSIS — R0981 Nasal congestion: Secondary | ICD-10-CM | POA: Diagnosis present

## 2017-11-15 DIAGNOSIS — E119 Type 2 diabetes mellitus without complications: Secondary | ICD-10-CM | POA: Diagnosis not present

## 2017-11-15 DIAGNOSIS — Z794 Long term (current) use of insulin: Secondary | ICD-10-CM | POA: Insufficient documentation

## 2017-11-15 DIAGNOSIS — R05 Cough: Secondary | ICD-10-CM | POA: Diagnosis present

## 2017-11-15 MED ORDER — PREDNISONE 20 MG PO TABS: 40.0000 mg | ORAL_TABLET | Freq: Every day | ORAL | 0 refills | Status: DC

## 2017-11-15 MED ORDER — DOXYCYCLINE HYCLATE 100 MG PO CAPS: 100.0000 mg | ORAL_CAPSULE | Freq: Two times a day (BID) | ORAL | 0 refills | Status: DC

## 2017-11-15 MED ORDER — BENZONATATE 100 MG PO CAPS: 100.0000 mg | ORAL_CAPSULE | Freq: Three times a day (TID) | ORAL | 0 refills | Status: DC | PRN

## 2017-11-15 NOTE — ED Provider Notes (Signed)
[facility] ____________________________________________  Time seen: Approximately 1:52 PM  I have reviewed the triage vital signs and the nursing notes.   HISTORY  Chief Complaint Cough   HPI [name] is a 72 y.o. female presenting for evaluation of 6 days of overall not feeling well with accompanying left ear discomfort, runny nose and nasal congestion.  Reports of the last 3 to 4 days she has had more chest congestion and cough symptoms.  Denies known wheezing.  States that she did have a fever of 100.3 yesterday in which she took Tylenol.  Denies any other fevers.  Has continued to overall eat and drink well.  States cough is productive of yellowish phlegm as well as yellowish phlegm from blowing nose.  States cough is worse at night with associated postnasal drainage.  Denies known direct sick contacts.  Has also been taking some over-the-counter [place] without much change.  Reports otherwise feels well.  Denies chest pain or shortness of breath, extremity swelling or extremity pain. Denies recent sickness. Denies recent antibiotic use.   Nelda Marseille, MD: PCP   Past Medical History:  Diagnosis Date  . Diabetes mellitus   . DVT (deep venous thrombosis) ([place])   . Foot fracture, right   . History of back surgery 1980   X2  . History of colon polyps   . Hyperlipidemia   . Osteoporosis   . Pneumonia   . Vitamin D deficiency     Patient Active Problem List   Diagnosis Date Noted  . Coronary artery disease due to lipid rich plaque 03/09/2015  . DJD (degenerative joint disease), cervical 08/15/2014  . Right shoulder pain 07/03/2014  . Hypomagnesemia 03/13/2014  . Nephrolithiasis 10/10/2013  . Medicare annual wellness visit, subsequent 03/10/2013  . Postmenopausal estrogen deficiency 03/10/2013  . Hyperlipidemia 02/21/2013  . Diabetes mellitus type 2 in obese ([place]) 12/06/2012  . Right hip pain 09/06/2012  . Morbid obesity ([place]) 01/09/2011    . Osteopenia 12/25/2010    Past Surgical History:  Procedure Laterality Date  . ABDOMINAL HYSTERECTOMY     total  . APPENDECTOMY    . arhtroscopy     left knee  . [place] TEST     abnormal, had cath which was normal  . CARPAL TUNNEL RELEASE    . CHOLECYSTECTOMY    . COLECTOMY     for benign polyps  . GLAUCOMA SURGERY     [facility]     No current facility-administered medications for this encounter.   Current Outpatient Medications:  .  aspirin 325 MG tablet, Take 325 mg by mouth daily., Disp: , Rfl:  .  benzonatate (TESSALON PERLES) 100 MG capsule, Take 1 capsule (100 mg total) by mouth 3 (three) times daily as needed for cough., Disp: 15 capsule, Rfl: 0 .  doxycycline (VIBRAMYCIN) 100 MG capsule, Take 1 capsule (100 mg total) by mouth 2 (two) times daily., Disp: 20 capsule, Rfl: 0 .  glucose blood (FREESTYLE LITE) test strip, Check sugar 2-3 times daily Dx E10.69, Disp: 300 each, Rfl: 3 .  Insulin Detemir (LEVEMIR FLEXTOUCH) 100 UNIT/ML Pen, Inject 60 Units into the skin 2 (two) times daily., Disp: 40 pen, Rfl: 3 .  MECLIZINE HCL PO, Take by mouth., Disp: , Rfl:  .  metFORMIN (GLUCOPHAGE) 1000 MG tablet, TAKE ONE TABLET BY MOUTH TWICE DAILY WITH MEALS, Disp: 180 tablet, Rfl: 4 .  nitroGLYCERIN (NITROSTAT) 0.4  MG SL tablet, Place 1 tablet (0.4 mg total) under the tongue every 5 (five) minutes as needed for chest pain., Disp: 30 tablet, Rfl: 6 .  predniSONE (DELTASONE) 20 MG tablet, Take 2 tablets (40 mg total) by mouth daily., Disp: 10 tablet, Rfl: 0  Allergies Tramadol; Buprenex [buprenorphine]; Iodinated diagnostic agents; Lisinopril; Losartan; Other; Sulfa drugs cross reactors; and Ventolin [albuterol]  Family History  Problem Relation Age of Onset  . Heart attack Father 81       MI  . Heart disease Father   . Arrhythmia Sister        pacemaker  . Breast cancer Paternal Aunt   . Breast cancer Cousin 47        pat cousin    Social History Social History   Tobacco Use  . Smoking status: Never Smoker  . Smokeless tobacco: Never Used  Substance Use Topics  . Alcohol use: No  . Drug use: No    Review of Systems Constitutional: As above.  ENT: No sore throat. Cardiovascular: Denies chest pain. Respiratory: Denies shortness of breath. Gastrointestinal: No abdominal pain.  No nausea, no vomiting.  No diarrhea.  No constipation. Genitourinary: Negative for dysuria. Musculoskeletal: Negative for back pain. Skin: Negative for rash.  ____________________________________________   PHYSICAL EXAM:  VITAL SIGNS: ED Triage Vitals  Enc Vitals Group     BP 11/15/17 1155 (!) 145/70     Pulse Rate 11/15/17 1155 84     Resp 11/15/17 1155 18     Temp 11/15/17 1155 98.8 F (37.1 C)     Temp Source 11/15/17 1155 Oral     SpO2 11/15/17 1155 97 %     Weight 11/15/17 1154 212 lb (96.2 kg)     Height 11/15/17 1154 5\' 2"  (1.575 m)     Head Circumference --      Peak Flow --      Pain Score 11/15/17 1154 5     Pain Loc --      Pain Edu? --      Excl. in [place]? --     Constitutional: Alert and oriented. Well appearing and in no acute distress. Eyes: Conjunctivae are normal. PERRL. EOMI. Head: Atraumatic.Mild tenderness to palpation bilateral frontal and maxillary sinuses. No swelling. No erythema.   Ears: no erythema, normal TMs bilaterally.   Nose: nasal congestion with bilateral nasal turbinate erythema and edema.   Mouth/Throat: Mucous membranes are moist.  Oropharynx non-erythematous.No tonsillar swelling or exudate.  Neck: No stridor.  No cervical spine tenderness to palpation. Hematological/Lymphatic/Immunilogical: No cervical lymphadenopathy. Cardiovascular: Normal rate, regular rhythm. Grossly normal heart sounds.  Good peripheral circulation. Respiratory: Normal respiratory effort.  No retractions.  Mild scattered rhonchi.  Minimal scattered inspiratory wheezes.  Good air movement.  Dry  intermittent cough noted in room.  Speaks in complete sentences. Gastrointestinal: Soft and nontender. Musculoskeletal: No lower extremity edema noted bilaterally.  No cervical, thoracic or lumbar tenderness to palpation.  Neurologic:  Normal speech and language. No gross focal neurologic deficits are appreciated. No gait instability. Skin:  Skin is warm, dry and intact. No rash noted. Psychiatric: Mood and affect are normal. Speech and behavior are normal.  ___________________________________________   LABS (all labs ordered are listed, but only abnormal results are displayed)  Labs Reviewed - No data to display  RADIOLOGY  Dg Chest 2 View  Result Date: 11/15/2017 CLINICAL DATA:  Cough and congestion EXAM: CHEST - 2 VIEW COMPARISON:  April 18, 2015 FINDINGS: There is  scarring in the left base region. There is no edema or consolidation. The heart size and pulmonary vascularity are normal. No adenopathy. There is degenerative change in the thoracic spine. IMPRESSION: Scarring left base.  No edema or consolidation. Electronically Signed   By: Lowella Grip III M.D.   On: 11/15/2017 13:13   ____________________________________________   PROCEDURES Procedures    INITIAL IMPRESSION / ASSESSMENT AND PLAN / ED COURSE  Pertinent labs & imaging results that were available during my care of the patient were reviewed by me and considered in my medical decision making (see chart for details).  Well-appearing patient.  No acute distress.  Suspect recent viral illness.  Rhonchi and some wheezes noted on exam.  Will evaluate chest x-ray.  Patient does report previous pneumonia.  Chest x-ray as above per radiologist scarring left base, no edema or consolidation.  Discussed in detail with patient.  Wanted to give patient breathing treatment, however patient reports that she is allergic to albuterol and further states that she does not tolerate any inhalers stating that she has been tried on  multiple in the past.  States that it causes her chest pain as well as increased heart rate.  Patient states that she does not want to try any inhalers at this time.  Will start patient on oral doxycycline and prednisone 5-day course.  Recommend closely monitoring blood sugars.  PRN Tessalon Perles.  Encouraged keeping her follow-up appointment with her primary care in 2 days.Discussed indication, risks and benefits of medications with patient.  Discussed follow up with Primary care physician this week. Discussed follow up and return parameters including no resolution or any worsening concerns. Patient verbalized understanding and agreed to plan.   ____________________________________________   FINAL CLINICAL IMPRESSION(S) / ED DIAGNOSES  Final diagnoses:  Upper respiratory tract infection, unspecified type  Chest congestion     ED Discharge Orders        Ordered    doxycycline (VIBRAMYCIN) 100 MG capsule  2 times daily,   Status:  Discontinued     11/15/17 1325    predniSONE (DELTASONE) 20 MG tablet  Daily,   Status:  Discontinued     11/15/17 1325    benzonatate (TESSALON PERLES) 100 MG capsule  3 times daily PRN,   Status:  Discontinued     11/15/17 1325    benzonatate (TESSALON PERLES) 100 MG capsule  3 times daily PRN     11/15/17 1335    doxycycline (VIBRAMYCIN) 100 MG capsule  2 times daily     11/15/17 1335    predniSONE (DELTASONE) 20 MG tablet  Daily     11/15/17 1335       Note: This dictation was prepared with Dragon dictation along with smaller phrase technology. Any transcriptional errors that result from this process are unintentional.         Marylene Land, NP 11/15/17 1631

## 2017-11-15 NOTE — Discharge Instructions (Addendum)
Take medication as prescribed. Rest. Drink plenty of fluids.  ° °Follow up with your primary care physician this week as needed. Return to Urgent care for new or worsening concerns.  ° °

## 2017-11-15 NOTE — ED Triage Notes (Signed)
Per patient c/o x 3-4 days cough. Patient c/o congestion in head and chest.

## 2021-04-11 ENCOUNTER — Other Ambulatory Visit: Payer: Self-pay | Admitting: Internal Medicine

## 2021-04-11 DIAGNOSIS — Z1231 Encounter for screening mammogram for malignant neoplasm of breast: Secondary | ICD-10-CM

## 2021-04-22 ENCOUNTER — Other Ambulatory Visit: Payer: Self-pay

## 2021-04-22 ENCOUNTER — Ambulatory Visit
Admission: RE | Admit: 2021-04-22 | Discharge: 2021-04-22 | Disposition: A | Discharge status: Home / Self Care | Payer: Medicare Other | Source: Ambulatory Visit | Attending: Internal Medicine | Admitting: Internal Medicine

## 2021-04-22 DIAGNOSIS — Z1231 Encounter for screening mammogram for malignant neoplasm of breast: Secondary | ICD-10-CM | POA: Diagnosis present

## 2021-08-12 ENCOUNTER — Ambulatory Visit: Payer: Medicare Other | Attending: Internal Medicine | Admitting: Physical Therapy

## 2021-08-12 ENCOUNTER — Encounter: Payer: Self-pay | Admitting: Physical Therapy

## 2021-08-12 DIAGNOSIS — R278 Other lack of coordination: Secondary | ICD-10-CM | POA: Insufficient documentation

## 2021-08-12 DIAGNOSIS — M6281 Muscle weakness (generalized): Secondary | ICD-10-CM | POA: Diagnosis present

## 2021-08-12 NOTE — Therapy (Signed)
?OUTPATIENT PHYSICAL THERAPY FEMALE PELVIC EVALUATION ? ? ?Patient Name: [name] ?MRN: [id number] ?DOB:Oct 06, 1945, 76 y.o., female ?Today's Date: 08/12/2021 ? ? PT End of Session - 08/12/21 1441   ? ? Visit Number 1   ? Number of Visits 12   ? Date for PT Re-Evaluation 11/04/21   ? Authorization Type IE 08/12/2021   ? PT Start Time 1200   ? PT Stop Time 1240   ? PT Time Calculation (min) 40 min   ? Activity Tolerance Patient tolerated treatment well   ? Behavior During Therapy [facility] for tasks assessed/performed   ? ?  ?  ? ?  ? ? ?Past Medical History:  ?Diagnosis Date  ? Diabetes mellitus   ? DVT (deep venous thrombosis) ([place])   ? Foot fracture, right   ? History of back surgery 1980  ? X2  ? History of colon polyps   ? Hyperlipidemia   ? Osteoporosis   ? Pneumonia   ? Vitamin D deficiency   ? ?Past Surgical History:  ?Procedure Laterality Date  ? ABDOMINAL HYSTERECTOMY    ? total  ? APPENDECTOMY    ? arhtroscopy    ? left knee  ? CARDIAC CATHETERIZATION    ? [facility]   ? CARDIOVASCULAR STRESS TEST    ? abnormal, had cath which was normal  ? CARPAL TUNNEL RELEASE    ? CHOLECYSTECTOMY    ? COLECTOMY    ? for benign polyps  ? GLAUCOMA SURGERY    ? [facility]  ? ?Patient Active Problem List  ? Diagnosis Date Noted  ? Coronary artery disease due to lipid rich plaque 03/09/2015  ? DJD (degenerative joint disease), cervical 08/15/2014  ? Right shoulder pain 07/03/2014  ? Hypomagnesemia 03/13/2014  ? Nephrolithiasis 10/10/2013  ? Medicare annual wellness visit, subsequent 03/10/2013  ? Postmenopausal estrogen deficiency 03/10/2013  ? Hyperlipidemia 02/21/2013  ? Diabetes mellitus type 2 in obese ([place]) 12/06/2012  ? Right hip pain 09/06/2012  ? Morbid obesity ([place]) 01/09/2011  ? Osteopenia 12/25/2010  ? ? ?PCP: Nelda Marseille, MD ? ?REFERRING PROVIDER: Nelda Marseille* ? ?REFERRING DIAG: N39.41 (ICD-10-CM) - Urge incontinence  ? ?THERAPY DIAG:  ?Other lack of coordination ? ?Muscle weakness  (generalized) ? ?ONSET DATE: 2021 ? ?SUBJECTIVE:                                                                                                                                                                                          ? ?Chief complaint: Patient reports that she started to have UUI after management of kidney stones. Patient notes difficulty with consuming fluids  for fear of leakage.  ? ? ?Pain: ?Are you having pain? No ? ? ?Precautions: None ? ?Falls: Has patient fallen in last 6 months? No ? ?Occupation/Current Activities: retired ? ?Patient stated goals: "learn how to hold my water" ? ?Pertinent History: ?Scoliosis Negative. ?Pulmonary disease/dysfunction Positive for OSA. ?Surgical history: Positive for see above. ? ?Obstetrical History: ?G3P3 ?Deliveries: SVD ?Tearing/Episiotomy: 1x episiotomy, 2x tear ? ?Gynecological History: ?Hysterectomy: Yes Abdominal ?Endometriosis: Negative ?Pelvic Organ Prolapse: Negative ?Last Menstrual Period:  ?Pain with exam: No ?Heaviness/pressure: No ? ?Urinary History: ?Frequency of urination: every 2-3 hours ?Incontinence: Urge to void and STS ? Onset: 2021 Amount: Min/Mod/Complete Loss.  ?Protective undergarments: Yes   ?Type: brief/ incontinence pad; Number used/day: 1x (only when in the community), 2-3 at home ?Fluid Intake: 4-6x 30 oz propel H20, rare coffee, diet dr pepper occasional ?Nocturia: 2-3x/night ?Complete emptying: Yes:   ?Pain with urination: Negative ?Stream: Strong and Weak ?Urgency: Yes:   ?Difficulty initiating urination: Negative ?Intermittent stream: Positive for 25% especially during the night ?Frequent UTI: Negative. ? ?Gastrointestinal History: ?Type of bowel movement:Type (Bristol Stool Scale) 5-7 ?Frequency of BMs: 2-6x/day ?Complete bowel movement: Yes:   ?Pain with defecation: Negative ?Straining with defecation: Negative ?Hemorrhoids: Negative ; internal/external; active/latent ?Fiber supplement: Yes: citrucel ?Incontinence: Negative.   ? ?Sexual History: ?Not sexually active.  ? ? ?OBJECTIVE:  ? ?PATIENT SURVEYS:  ?FOTO PFDI Urinary 42; Urinary Problem 53 ? ?COGNITION: ?Overall cognitive status: Within functional limits for tasks assessed   ?  ?POSTURE/OBSERVATIONS:  ?Lumbar lordosis: appearing WFL ?Thoracic kyphosis: appearing increased ?Iliac crest height: not formally assessed ?Lumbar lateral shift: not formally assessed  ?Pelvic obliquity: not formally assessed  ?Leg length discrepancy: not formally assessed ? ? ?GAIT: ?Patient ambulates with WBOS and considerable weight shift over WB limb for postural stability ?Assistive device utilized: None ?Level of assistance: Complete Independence ?Trendelenburg R: Positive L: Positive ? ?RANGE OF MOTION: deferred 2/2 to time constraints ?  LEFT RIGHT  ?Lumbar forward flexion (65):      ?Lumbar extension (30):     ?Lumbar lateral flexion (25):     ?Thoracic and Lumbar rotation (30 degrees):       ?Hip Flexion (0-125):      ?Hip IR (0-45):     ?Hip ER (0-45):     ?Hip Abduction (0-40):     ?Hip extension (0-15):     ? ?SENSATION: deferred 2/2 to time constraints ?Grossly intact to light touch bilateral LEs as determined by testing dermatomes L2-S2 ?Proprioception and hot/cold testing deferred on this date ? ?STRENGTH: MMT deferred 2/2 to time constraints ? RLE LLE  ?Hip Flexion    ?Hip Extension    ?Hip Abduction     ?Hip Adduction     ?Hip ER     ?Hip IR     ?Knee Extension    ?Knee Flexion    ?Dorsiflexion     ?Plantarflexion (seated)    ? ?ABDOMINAL: deferred 2/2 to time constraints ?Palpation: ?Diastasis: ?Scar mobility: ?Rib flare: ? ?SPECIAL TESTS: ?not formally assessed  ? ?PHYSICAL PERFORMANCE MEASURES: ?STS: WFL, no UE support ?Deep Squat: ?RLE STS: ?LLE STS:  ?6MWT: ?5TSTS:   ? ?EXTERNAL PELVIC EXAM: deferred 2/2 to time constraints ?Breath coordination: ?Voluntary Contraction: present/absent ?Relaxation: full/delayed/non-relaxing ?Perineal movement with sustained IAP increase ("bear down"):  descent/no change/elevation/excessive descent ?Perineal movement with rapid IAP increase ("cough"): elevation/no change/descent ? ?INTERNAL VAGINAL EXAM: deferred 2/2 to time constraints ?Introitus Appears:  ?Skin integrity:  ?Scar mobility: ?Strength (  PERF):  ?Symmetry: ?Palpation: ?Prolapse: ? ?INTERNAL RECTAL EXAM: not indicated ?Strength (PERF): ?Symmetry: ?Palpation: ?Prolapse: ? ? ?PATIENT EDUCATION:  ?Patient educated on prognosis, POC, and provided with HEP including: not intiated. Patient articulated understanding and returned demonstration. Patient will benefit from further education in order to maximize compliance and understanding for long-term therapeutic gains. ? ? ?ASSESSMENT: ? ?Clinical impression: ?Patient is a 75 year old presenting to clinic with chief complaints of urinary urgency and UI. Today's evaluation is suggestive of deficits in posture, PFM strength, PFM coordination, bladder behaviors, and IAP management as evidenced by UI with coughing/laughing/sneezing, UI with urgency and postural changes, decreased water consumption, increased thoracic kyphosis. Patient's responses on FOTO outcome measures (PFDI Urinary 42, Urinary Problem 53) indicate moderate functional limitations/disability/distress. Patient's progress may be limited due to presence of comorbidities and aging process; however, patient's motivation is advantageous. Patient will benefit from continued skilled therapeutic intervention to address deficits in posture, PFM strength, PFM coordination, bladder behaviors, and IAP management in order to increase function and improve overall QOL. ? ? ?Objective impairments: Abnormal gait, decreased activity tolerance, decreased coordination, decreased endurance, decreased strength, improper body mechanics, and postural dysfunction.  ? ?Activity limitations: community activity, driving, laundry, and shopping.  ? ?Personal factors: Age, Behavior pattern, Past/current experiences, Time since  onset of injury/illness/exacerbation, and 3+ comorbidities: DM, nephrolithiasis, osteoporosis, hyperlipidemia, back surgery x2, CAD  are also affecting patient's functional outcome.  ? ?Rehab Potential: Fair

## 2021-08-19 ENCOUNTER — Ambulatory Visit: Payer: Medicare Other | Admitting: Physical Therapy

## 2021-08-19 ENCOUNTER — Encounter: Payer: Self-pay | Admitting: Physical Therapy

## 2021-08-19 DIAGNOSIS — R278 Other lack of coordination: Secondary | ICD-10-CM | POA: Diagnosis not present

## 2021-08-19 DIAGNOSIS — M6281 Muscle weakness (generalized): Secondary | ICD-10-CM

## 2021-08-19 NOTE — Therapy (Signed)
?OUTPATIENT PHYSICAL THERAPY TREATMENT NOTE ? ? ?Patient Name: [name] ?MRN: [id number] ?DOB:11-Jun-1945, 76 y.o., female ?Today's Date: 08/19/2021 ? ?PCP: Nelda Marseille, MD ?REFERRING PROVIDER: Nelda Marseille* ? ?END OF SESSION:  ? PT End of Session - 08/19/21 1026   ? ? Visit Number 2   ? Number of Visits 12   ? Date for PT Re-Evaluation 11/04/21   ? Authorization Type IE 08/12/2021   ? PT Start Time 1025   ? PT Stop Time 1105   ? PT Time Calculation (min) 40 min   ? Activity Tolerance Patient tolerated treatment well   ? Behavior During Therapy [facility] for tasks assessed/performed   ? ?  ?  ? ?  ? ? ?Past Medical History:  ?Diagnosis Date  ? Diabetes mellitus   ? DVT (deep venous thrombosis) ([place])   ? Foot fracture, right   ? History of back surgery 1980  ? X2  ? History of colon polyps   ? Hyperlipidemia   ? Osteoporosis   ? Pneumonia   ? Vitamin D deficiency   ? ?Past Surgical History:  ?Procedure Laterality Date  ? ABDOMINAL HYSTERECTOMY    ? total  ? APPENDECTOMY    ? arhtroscopy    ? left knee  ? CARDIAC CATHETERIZATION    ? [facility]   ? CARDIOVASCULAR STRESS TEST    ? abnormal, had cath which was normal  ? CARPAL TUNNEL RELEASE    ? CHOLECYSTECTOMY    ? COLECTOMY    ? for benign polyps  ? GLAUCOMA SURGERY    ? [facility]  ? ?Patient Active Problem List  ? Diagnosis Date Noted  ? Coronary artery disease due to lipid rich plaque 03/09/2015  ? DJD (degenerative joint disease), cervical 08/15/2014  ? Right shoulder pain 07/03/2014  ? Hypomagnesemia 03/13/2014  ? Nephrolithiasis 10/10/2013  ? Medicare annual wellness visit, subsequent 03/10/2013  ? Postmenopausal estrogen deficiency 03/10/2013  ? Hyperlipidemia 02/21/2013  ? Diabetes mellitus type 2 in obese ([place]) 12/06/2012  ? Right hip pain 09/06/2012  ? Morbid obesity ([place]) 01/09/2011  ? Osteopenia 12/25/2010  ? ? ?REFERRING DIAG: N39.41 (ICD-10-CM) - Urge incontinence  ? ?THERAPY DIAG:  ?Other lack of coordination ? ?Muscle weakness  (generalized) ? ?PERTINENT HISTORY: Scoliosis Negative. ?Pulmonary disease/dysfunction Positive for OSA. ?Surgical history: Positive for see above. ? ?PRECAUTIONS: None ? ?SUBJECTIVE: Patient states that she has been consuming more water this week and has found using propel packets to be helpful. Patient notes that she has had a pain in her side all weekend and thinks this may be a kidney stone. Patient is working on getting [place] completed.  ? ?PAIN:  ?Are you having pain? Yes: NPRS scale: 2-3/10 ?Pain location: L flank ?Pain description: aching ? ?TREATMENT ? ?Pre-treatment assessment: ? ?EXTERNAL PELVIC EXAM: Patient educated on the purpose of the pelvic exam and articulated understanding; patient consented to the exam verbally. ?Breath coordination: present ?Cued Lengthen: perineal descent  ?Cued Contraction: x5 reps, 2-3/5 MMT ?Cued Relaxation: full ?Cough: perineal ascent ? ?Manual Therapy: ? ? ?Neuromuscular Re-education: ?Patient educated extensively on typical bladder function, typical bladder habits, basics of urge suppression, bladder irritants in order to better regulate bladder through behavioral changes.  ?Supine hooklying diaphragmatic breathing with VCs and TCs for downregulation of the nervous system and improved management of IAP ?Supine hooklying, PFM contractions with exhalation. VCs and TCs to decrease compensatory patterns and encourage activation of the PFM. ? ?Therapeutic Exercise: ? ? ?  Treatments unbilled: ? ?Post-treatment assessment: ? ?Patient educated throughout session on appropriate technique and form using multi-modal cueing, HEP, and activity modification. Patient articulated understanding and returned demonstration. ? ?Patient Response to interventions: ? ? ?ASSESSMENT ?Clinical impression: ?Patient presents to clinic with excellent motivation to participate in therapy. Patient demonstrates deficits in posture, PFM strength, PFM coordination, bladder behaviors, and IAP management.  Patient able to achieve coordinated PFM contraction with palpable squeeze and lift for 5 repetitions during today's session and responded positively to educational interventions. Patient will benefit from continued skilled therapeutic intervention to address remaining deficits in posture, PFM strength, PFM coordination, bladder behaviors, and IAP management in order to increase function and improve overall QOL. ? ?Objective impairments: Abnormal gait, decreased activity tolerance, decreased coordination, decreased endurance, decreased strength, improper body mechanics, and postural dysfunction.  ? ?Activity limitations: community activity, driving, laundry, and shopping.  ? ?Personal factors: Age, Behavior pattern, Past/current experiences, Time since onset of injury/illness/exacerbation, and 3+ comorbidities: DM, nephrolithiasis, osteoporosis, hyperlipidemia, back surgery x2, CAD are also affecting patient's functional outcome.  ? ?Rehab Potential: Fair  ? ?Clinical decision making: Evolving/moderate complexity ? ?Evaluation complexity: Moderate ? ? ?GOALS: ?Goals reviewed with patient? Yes ? ? ?LONG TERM GOALS: Target date: 11/04/2021 ? ?Patient will demonstrate improved function as evidenced by a score of 60 on FOTO Urinary Problem measure for full participation in activities at home and in the community. ? ?Baseline: 53 ?Goal status: INITIAL ? ?2. Patient will report confidence in ability to control bladder > 7/10 in order to demonstrate improved function and ability to participate more fully in activities at home and in the community. ? ?Baseline: 5 ?Goal status: INITIAL ? ?3.  Patient will demonstrate improved function as evidenced by a score of < 20 on FOTO PFDI Urinary measure for full participation in activities at home and in the community. ? ?Baseline: 42 ?Goal status: INITIAL ? ?4.  Patient will demonstrate circumferential and sequential contraction of >3/5 MMT, > 6 sec hold x10 and 5 consecutive quick  flicks with </= 10 min rest between testing bouts, and relaxation of the PFM coordinated with breath for improved management of intra-abdominal pressure and normal bowel and bladder function without the presence of pain nor incontinence in order to improve participation at home and in the community. ? ?Baseline: not assessed ?Goal status: INITIAL ? ? ? ?PLAN: ?Rehab frequency: 1x/week ? ?Rehab duration: 12 weeks ? ?Planned interventions: Therapeutic exercises, Therapeutic activity, Neuromuscular re-education, Balance training, Gait training, Patient/Family education, Joint mobilization, Electrical stimulation, Cryotherapy, Moist heat, scar mobilization, Taping, Biofeedback, and Manual therapy ? ? ? ? ? ? ?Myles Gip PT, DPT [phone]  ?08/19/2021, 10:27 AM ? ?  ? ?

## 2021-08-26 ENCOUNTER — Ambulatory Visit: Payer: Medicare Other | Admitting: Physical Therapy

## 2021-09-04 ENCOUNTER — Ambulatory Visit: Payer: Medicare Other | Admitting: Physical Therapy

## 2021-09-09 ENCOUNTER — Encounter: Payer: Medicare Other | Admitting: Physical Therapy

## 2022-01-27 ENCOUNTER — Encounter: Payer: Self-pay | Admitting: Physical Therapy

## 2022-01-27 ENCOUNTER — Ambulatory Visit: Payer: Medicare Other | Attending: Internal Medicine | Admitting: Physical Therapy

## 2022-01-27 DIAGNOSIS — R262 Difficulty in walking, not elsewhere classified: Secondary | ICD-10-CM | POA: Diagnosis present

## 2022-01-27 DIAGNOSIS — R278 Other lack of coordination: Secondary | ICD-10-CM | POA: Insufficient documentation

## 2022-01-27 DIAGNOSIS — M6281 Muscle weakness (generalized): Secondary | ICD-10-CM | POA: Diagnosis present

## 2022-01-27 DIAGNOSIS — M25552 Pain in left hip: Secondary | ICD-10-CM

## 2022-01-27 NOTE — Therapy (Signed)
OUTPATIENT PHYSICAL THERAPY LOWER QUARTER EVALUATION   Patient Name: [name] MRN: [id number] DOB:07-Nov-1945, 76 y.o., female Today's Date: 01/27/2022   PT End of Session - 01/27/22 1301     Visit Number 1    Number of Visits 17    Authorization Type Medicare 2023, VL based on medical necessity    Progress Note Due on Visit 10    PT Start Time 1305    PT Stop Time 1400    PT Time Calculation (min) 55 min    Activity Tolerance Patient tolerated treatment well    Behavior During Therapy WFL for tasks assessed/performed             Past Medical History:  Diagnosis Date   Diabetes mellitus    DVT (deep venous thrombosis) ([facility])    Foot fracture, right    History of back surgery 1980   X2   History of colon polyps    Hyperlipidemia    Osteoporosis    Pneumonia    Vitamin D deficiency    Past Surgical History:  Procedure Laterality Date   ABDOMINAL HYSTERECTOMY     total   [facility]    CARDIOVASCULAR STRESS TEST     abnormal, had cath which was normal   CARPAL TUNNEL RELEASE     CHOLECYSTECTOMY     COLECTOMY     for benign polyps   GLAUCOMA SURGERY     [facility]   Patient Active Problem List   Diagnosis Date Noted   Coronary artery disease due to lipid rich plaque 03/09/2015   DJD (degenerative joint disease), cervical 08/15/2014   Right shoulder pain 07/03/2014   Hypomagnesemia 03/13/2014   Nephrolithiasis 10/10/2013   Medicare annual wellness visit, subsequent 03/10/2013   Postmenopausal estrogen deficiency 03/10/2013   Hyperlipidemia 02/21/2013   Diabetes mellitus type 2 in obese ([place]) 12/06/2012   Right hip pain 09/06/2012   Morbid obesity ([place]) 01/09/2011   Osteopenia 12/25/2010    PCP: Nelda Marseille, MD  REFERRING PROVIDER: Nelda Marseille*  REFERRING DIAGNOSIS: T55.73 (ICD-10-CM) - Unilateral primary osteoarthritis, left hip  THERAPY DIAG: Pain in left hip  Muscle  weakness (generalized)  Difficulty in walking, not elsewhere classified  RATIONALE FOR EVALUATION AND TREATMENT: Rehabilitation  ONSET DATE: 12/2020  FOLLOW UP APPT WITH PROVIDER: Yes , f/u with Dr. Larene Beach PA in October   SUBJECTIVE:  Chief Complaint: Patient is a 76 year old female referred for left hip pain with med hx including Type 2 DM, HLD, osteoporosis, Vitamin D deficiency, hx of DVT, and hx of back surgery x 2.   Pertinent History Patient is a 76 year old female referred for left hip pain with med hx including Type 2 DM, stage 3 kidney failure, HLD, osteoporosis, Vitamin D deficiency, hx of DVT, and hx of back surgery x 2. Patient currently reports insidious onset of pain, atraumatic onset (patient reports no Hx of trauma or hip injury). Patient reports lateral hip and anterior hip pain with referral to L groin region. Pt reports that she has been told she may need THA. Patient reports mild neuropathy in her feet. Pt denies NT in her lower limbs otherwise. Patient reports most of the time, she does well with sleeping/lying down. Pt reports RLE did almost give way in grocery store one time; she had to grab her cart to not fall. No relief with OTC pain medications. Patient reports increased urinary urgency for which she had previous episode of pelvic floor PT. Pt has upcoming cataract surgeries in October, both eyes to be done in 2nd and 4th week of the month.   Pain:  Pain Intensity: Present: 5/10, Best: 0/10, Worst: 10/10 Pain location: R lateral hip, anterior hip, groin Pain Quality: dull, throbbing Radiating: No  Numbness/Tingling: No Focal Weakness: Yes, side of hip/leg  Aggravating factors: sitting, prolonged walking (gets weaker) Relieving factors: lying down 24-hour pain behavior: worse later in  the day How long can you sit: no specific time limit to sitting History of prior hip/back injury, pain, surgery, or therapy: Yes, Hx of backsurgery x 2 Falls: Has patient fallen in last 6 months? No, Number of falls: N/A Follow-up appointment with MD: Yes, in October  Imaging: Yes , radiograph of L hip (August 2022)  Moderate joint space narrowing of both hips. 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 Enesthopathy of distal insertion of gluteal tendons.   Prior level of function: Independent with community mobility without device Hobbies: reading, crochet, gardening (vegetables) Red flags (bowel/bladder changes, saddle paresthesia, personal history of cancer, h/o spinal tumors, h/o compression fx, h/o abdominal aneurysm, abdominal pain, chills/fever, night sweats, nausea, vomiting, unrelenting pain, first onset of insidious LBP <20 y/o): Negative  Precautions: None  Weight Bearing Restrictions: No  Living Environment Lives with: lives alone, pt has 2 daughters who live locally Lives in: House/apartment, pt has ramp to get into home Has following equipment at home: None   Patient Goals: No falls, able to walk better     OBJECTIVE:   Patient Surveys  FOTO 43, predicted outcome score of 55   Cognition Patient is oriented to person, place, and time.  Recent memory is intact.  Remote memory is intact.  Attention span and concentration are intact.  Expressive speech is intact.  Patient's fund of knowledge is within normal limits for educational level.     Gross Musculoskeletal Assessment Tremor: None Bulk: Normal Tone: Normal No visible step-off along spinal column, no signs of scoliosis   GAIT: Pt demonstrates forward flexed posture, dec stance time on LLE, compensated Trendelenburg   Posture: Lumbar lordosis: WNL Elevated scapulae, increased thoracic kyphosis, dec lumbar lordosis Pt sits with weight shifted to R ischium    AROM  AROM (Normal range in degrees) AROM   01/27/2022  Lumbar   Flexion (65) 100% stiff with return to neural   Extension (30) 75%  Right lateral flexion (25) 50% pulls on L  hip*  Left lateral flexion (25) 25%*  Right rotation (30) 25%*  Left rotation (30) 75%      Hip Right Left  Flexion (125) 90 90*  Extension (15)    Abduction (40)    Adduction  35 30*  Internal Rotation (45) 45 30  External Rotation (45) 30 30      (* = pain; Blank rows = not tested)   LE MMT:  MMT (out of 5) Right 01/27/2022 Left 01/27/2022  Hip flexion 4 4-*  Hip extension    Hip abduction (seated) 4 4  Hip adduction 4 4  Hip internal rotation    Hip external rotation    Knee flexion 4 4-*  Knee extension 4+ 4  Ankle dorsiflexion 5 4  Ankle plantarflexion    Ankle inversion    Ankle eversion    (* = pain; Blank rows = not tested)   Sensation Grossly intact to light touch bilateral LEs as determined by testing dermatomes L2-S2. Proprioception, and hot/cold testing deferred on this date.   Muscle Length Hamstrings: R: Negative L: Lacking 20 deg extension (onset of L iliolumbar and L hip pain*)   Palpation  Location LEFT  RIGHT           Lumbar paraspinals  1  Quadratus Lumborum    Gluteus Maximus  2  Gluteus Medius  2  Deep hip external rotators  2  PSIS  2  Fortin's Area (SIJ)    Greater Trochanter  2  Hip flexors  2  (Blank rows = not tested) Graded on 0-4 scale (0 = no pain, 1 = pain, 2 = pain with wincing/grimacing/flinching, 3 = pain with withdrawal, 4 = unwilling to allow palpation), (Blank rows = not tested)     SPECIAL TESTS Lumbar Radiculopathy and Discogenic: Centralization and Peripheralization (SN 92, -LR 0.12): Not examined Slump (SN 83, -LR 0.32): R: Negative L: Positive for lateral hip pain  SLR (SN 92, -LR 0.29): R: Negative L:  Positive Crossed SLR (SP 90): R: Negative L: Negative Lumbar general traction in hooklying: Negative for pain relief   Hip: FABER (SN 81): R: Not examined L: Positive FADIR  (SN 94): R: Not examined L: Not examined Hip scour (SN 50): R: Not examined L: Positive  SIJ:  Thigh Thrust (SN 88, -LR 0.18) : R: Not examined L: Positive      TODAY'S TREATMENT    Therapeutic Exercise - for HEP establishment, discussion on appropriate exercise/activity modification, PT education   Reviewed baseline home exercises and provided handout for [facility] program (see Access Code); tactile cueing and therapist demonstration utilized as needed for carryover of proper technique to HEP.    Patient education on current condition, anatomy involved, prognosis, plan of care.     PATIENT EDUCATION:  Education details: see above for patient education details Person educated: Patient Education method: Explanation, Demonstration, and Handouts Education comprehension: verbalized understanding   HOME EXERCISE PROGRAM: Access Code: [email or web address] URL: [email or web address] Date: 01/27/2022 Prepared by: Valentina Gu  Exercises - Supine Lower Trunk Rotation  - 2 x daily - 7 x weekly - 2 sets - 10 reps - Supine Bridge  - 1 x daily - 7 x weekly - 2 sets - 10 reps - Seated Hip Adduction Isometrics with Ball  - 2 x daily - 7 x weekly - 2 sets - 10 reps - 5sec hold - Seated Hip Abduction with Resistance  - 2 x daily - 7 x weekly - 2 sets -  10 reps   ASSESSMENT:  CLINICAL IMPRESSION: Patient is a 76 y.o. female who was seen today for physical therapy evaluation and treatment for left hip pain. Patient has history of lumbar spine decompression x 3, no history of fusion per patient. Given examination findings and positive testing above, overlay of lumbar spine referred pain could be contributing to gluteal and posterolateral hip pain. Pt does have known moderate degenerative changes in her hips and likely does have pain stemming from hip joint/OA. Objective impairments include Abnormal gait, decreased activity tolerance, difficulty walking, decreased ROM, decreased  strength, impaired flexibility, and pain. These impairments are limiting patient from driving, shopping, and community activity. Personal factors including Age, Past/current experiences, Time since onset of injury/illness/exacerbation, and 3+ comorbidities: osteoporosis,   are also affecting patient's functional outcome. Patient will benefit from skilled PT to address above impairments and improve overall function.  REHAB POTENTIAL: Good  CLINICAL DECISION MAKING: Evolving/moderate complexity  EVALUATION COMPLEXITY: Moderate   GOALS:  SHORT TERM GOALS: Target date: 02/17/2022  Pt will be independent with HEP to improve strength and decrease back pain to improve pain-free function at home and work. Baseline: 01/27/22: Baseline HEP initiated Goal status: INITIAL   LONG TERM GOALS: Target date: 03/24/2022  Pt will increase FOTO to at least 55 to demonstrate significant improvement in function at home and work related to back pain  Baseline: 01/27/22: 43 Goal status: INITIAL  2.  Pt will decrease worst hip pain by at least 3 points on the NPRS in order to demonstrate clinically significant reduction in hip pain. Baseline: 01/27/22: 10/10 at worst Goal status: INITIAL  3.  Pt will improve tested lower extremity manual muscle tests to at least 4+ or greater for all motions without reproduction of hip pain indicative of improved tolerance to loading and strength as needed for improved ability to perform transfers and loading-bearing tasks Baseline: 01/27/22: MMTs 4- to 4+ with exception of 5/5 R ankle dorsiflexion Goal status: INITIAL  4.  Patient will demonstrate normalized gait pattern with no significant compensated Trendelenburg or lurch for home-mobility distance (150 ft or greater) indicative of improved ability to negotiate her home Baseline: 01/27/22: Pt demonstrates forward flexed posture, dec stance time on LLE, compensated Trendelenburg Goal status: INITIAL  5.  Patient will perform  sit to stand with no UE support without reproduction of symptoms indicative of improved functional LE strength as needed for transferring and power production to prevent fall during episode of LOB Baseline: 01/27/22: heavy UE assist for sit to stand Goal status: INITIAL  PLAN: PT FREQUENCY: 2x/week  PT DURATION: 8 weeks  PLANNED INTERVENTIONS: Therapeutic exercises, Therapeutic activity, Neuromuscular re-education, Balance training, Gait training, Patient/Family education, Joint manipulation, Joint mobilization, Dry Needling, Electrical stimulation, Spinal mobilization, Cryotherapy, Moist heat, Traction, and Manual therapy  PLAN FOR NEXT SESSION: Manual techniques and modalities prn to improve sensitivity of L hip and gluteal muscles. Review HEP. Continue with low-impact hip and general LE strengthening. Gradually progress with closed-chain drills as able.      Valentina Gu, PT, DPT #[id number]  Eilleen Kempf 01/27/2022, 3:19 PM

## 2022-01-29 ENCOUNTER — Encounter: Payer: Medicare Other | Admitting: Physical Therapy

## 2022-02-03 ENCOUNTER — Ambulatory Visit: Payer: Medicare Other

## 2022-02-03 DIAGNOSIS — M25552 Pain in left hip: Secondary | ICD-10-CM

## 2022-02-03 DIAGNOSIS — R262 Difficulty in walking, not elsewhere classified: Secondary | ICD-10-CM

## 2022-02-03 DIAGNOSIS — M6281 Muscle weakness (generalized): Secondary | ICD-10-CM

## 2022-02-03 DIAGNOSIS — R278 Other lack of coordination: Secondary | ICD-10-CM

## 2022-02-03 NOTE — Therapy (Signed)
OUTPATIENT PHYSICAL THERAPY LOWER QUARTER TREATMENT   Patient Name: [name] MRN: [id number] DOB:1945-11-25, 76 y.o., female Today's Date: 02/03/2022   PT End of Session - 02/03/22 1324     Visit Number 2    Number of Visits 17    Authorization Type Medicare 2023, VL based on medical necessity    Progress Note Due on Visit 10    PT Start Time 1329    PT Stop Time 1413    PT Time Calculation (min) 44 min    Activity Tolerance Patient tolerated treatment well    Behavior During Therapy WFL for tasks assessed/performed             Past Medical History:  Diagnosis Date   Diabetes mellitus    DVT (deep venous thrombosis) (HCC)    Foot fracture, right    History of back surgery 1980   X2   History of colon polyps    Hyperlipidemia    Osteoporosis    Pneumonia    Vitamin D deficiency    Past Surgical History:  Procedure Laterality Date   ABDOMINAL HYSTERECTOMY     total   [facility]    CARDIOVASCULAR STRESS TEST     abnormal, had cath which was normal   CARPAL TUNNEL RELEASE     CHOLECYSTECTOMY     COLECTOMY     for benign polyps   GLAUCOMA SURGERY     [facility]   Patient Active Problem List   Diagnosis Date Noted   Coronary artery disease due to lipid rich plaque 03/09/2015   DJD (degenerative joint disease), cervical 08/15/2014   Right shoulder pain 07/03/2014   Hypomagnesemia 03/13/2014   Nephrolithiasis 10/10/2013   Medicare annual wellness visit, subsequent 03/10/2013   Postmenopausal estrogen deficiency 03/10/2013   Hyperlipidemia 02/21/2013   Diabetes mellitus type 2 in obese ([place]) 12/06/2012   Right hip pain 09/06/2012   Morbid obesity ([place]) 01/09/2011   Osteopenia 12/25/2010    PCP: Nelda Marseille, MD  REFERRING PROVIDER: Nelda Marseille*  REFERRING DIAGNOSIS: T01.60 (ICD-10-CM) - Unilateral primary osteoarthritis, left hip  THERAPY DIAG: Pain in left hip  Muscle  weakness (generalized)  Difficulty in walking, not elsewhere classified  Other lack of coordination  RATIONALE FOR EVALUATION AND TREATMENT: Rehabilitation  ONSET DATE: 12/2020  FOLLOW UP APPT WITH PROVIDER: Yes , f/u with Dr. Larene Beach PA in October   SUBJECTIVE:  Chief Complaint: Patient is a 76 year old female referred for left hip pain with med hx including Type 2 DM, HLD, osteoporosis, Vitamin D deficiency, hx of DVT, and hx of back surgery x 2.   Pertinent History Patient is a 76 year old female referred for left hip pain with med hx including Type 2 DM, stage 3 kidney failure, HLD, osteoporosis, Vitamin D deficiency, hx of DVT, and hx of back surgery x 2. Patient currently reports insidious onset of pain, atraumatic onset (patient reports no Hx of trauma or hip injury). Patient reports lateral hip and anterior hip pain with referral to L groin region. Pt reports that she has been told she may need THA. Patient reports mild neuropathy in her feet. Pt denies NT in her lower limbs otherwise. Patient reports most of the time, she does well with sleeping/lying down. Pt reports RLE did almost give way in grocery store one time; she had to grab her cart to not fall. No relief with OTC pain medications. Patient reports increased urinary urgency for which she had previous episode of pelvic floor PT. Pt has upcoming cataract surgeries in October, both eyes to be done in 2nd and 4th week of the month.   Pain:  Pain Intensity: Present: 5/10, Best: 0/10, Worst: 10/10 Pain location: R lateral hip, anterior hip, groin Pain Quality: dull, throbbing Radiating: No  Numbness/Tingling: No Focal Weakness: Yes, side of hip/leg  Aggravating factors: sitting, prolonged walking (gets weaker) Relieving factors: lying down 24-hour  pain behavior: worse later in the day How long can you sit: no specific time limit to sitting History of prior hip/back injury, pain, surgery, or therapy: Yes, Hx of backsurgery x 2 Falls: Has patient fallen in last 6 months? No, Number of falls: N/A Follow-up appointment with MD: Yes, in October  Imaging: Yes , radiograph of L hip (August 2022)  Moderate joint space narrowing of both hips. 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 Enesthopathy of distal insertion of gluteal tendons.   Prior level of function: Independent with community mobility without device Hobbies: reading, crochet, gardening (vegetables) Red flags (bowel/bladder changes, saddle paresthesia, personal history of cancer, h/o spinal tumors, h/o compression fx, h/o abdominal aneurysm, abdominal pain, chills/fever, night sweats, nausea, vomiting, unrelenting pain, first onset of insidious LBP <20 y/o): Negative  Precautions: None  Weight Bearing Restrictions: No  Living Environment Lives with: lives alone, pt has 2 daughters who live locally Lives in: House/apartment, pt has ramp to get into home Has following equipment at home: None   Patient Goals: No falls, able to walk better     OBJECTIVE:   Patient Surveys  FOTO 43, predicted outcome score of 55   Cognition Patient is oriented to person, place, and time.  Recent memory is intact.  Remote memory is intact.  Attention span and concentration are intact.  Expressive speech is intact.  Patient's fund of knowledge is within normal limits for educational level.     Gross Musculoskeletal Assessment Tremor: None Bulk: Normal Tone: Normal No visible step-off along spinal column, no signs of scoliosis   GAIT: Pt demonstrates forward flexed posture, dec stance time on LLE, compensated Trendelenburg   Posture: Lumbar lordosis: WNL Elevated scapulae, increased thoracic kyphosis, dec lumbar lordosis Pt sits with weight shifted to R ischium    AROM  AROM (Normal  range in degrees) AROM  02/03/2022  Lumbar   Flexion (65) 100% stiff with return to neural   Extension (30) 75%  Right lateral flexion (25) 50% pulls on L  hip*  Left lateral flexion (25) 25%*  Right rotation (30) 25%*  Left rotation (30) 75%      Hip Right Left  Flexion (125) 90 90*  Extension (15)    Abduction (40)    Adduction  35 30*  Internal Rotation (45) 45 30  External Rotation (45) 30 30      (* = pain; Blank rows = not tested)   LE MMT:  MMT (out of 5) Right 02/03/2022 Left 02/03/2022  Hip flexion 4 4-*  Hip extension    Hip abduction (seated) 4 4  Hip adduction 4 4  Hip internal rotation    Hip external rotation    Knee flexion 4 4-*  Knee extension 4+ 4  Ankle dorsiflexion 5 4  Ankle plantarflexion    Ankle inversion    Ankle eversion    (* = pain; Blank rows = not tested)   Sensation Grossly intact to light touch bilateral LEs as determined by testing dermatomes L2-S2. Proprioception, and hot/cold testing deferred on this date.   Muscle Length Hamstrings: R: Negative L: Lacking 20 deg extension (onset of L iliolumbar and L hip pain*)   Palpation  Location LEFT  RIGHT           Lumbar paraspinals  1  Quadratus Lumborum    Gluteus Maximus  2  Gluteus Medius  2  Deep hip external rotators  2  PSIS  2  Fortin's Area (SIJ)    Greater Trochanter  2  Hip flexors  2  (Blank rows = not tested) Graded on 0-4 scale (0 = no pain, 1 = pain, 2 = pain with wincing/grimacing/flinching, 3 = pain with withdrawal, 4 = unwilling to allow palpation), (Blank rows = not tested)     SPECIAL TESTS Lumbar Radiculopathy and Discogenic: Centralization and Peripheralization (SN 92, -LR 0.12): Not examined Slump (SN 83, -LR 0.32): R: Negative L: Positive for lateral hip pain  SLR (SN 92, -LR 0.29): R: Negative L:  Positive Crossed SLR (SP 90): R: Negative L: Negative Lumbar general traction in hooklying: Negative for pain relief   Hip: FABER (SN 81): R: Not  examined L: Positive FADIR (SN 94): R: Not examined L: Not examined Hip scour (SN 50): R: Not examined L: Positive  SIJ:  Thigh Thrust (SN 88, -LR 0.18) : R: Not examined L: Positive      TODAY'S TREATMENT : 02/03/22  SUBJECTIVE: Pt reports 4/10 LBP and L hip pain. Thinks one of her exercises aggravated her back/hip.    There.ex:   Nu-Step L2 for 4 minutes for UE/LE use and gentle lumbar mobility.    Review of HEP   Access Code: [email or web address] URL: [email or web address] Date: 01/27/2022 Prepared by: Valentina Gu   Exercises - Supine Lower Trunk Rotation  - 2 x daily - 7 x weekly - 2 sets - 10 reps - Supine Bridge  - 1 x daily - 7 x weekly - 2 sets - 10 reps - Seated Hip Adduction Isometrics with Ball  - 2 x daily - 7 x weekly - 2 sets - 10 reps - 5sec hold - Seated Hip Abduction with Resistance  - 2 x daily - 7 x weekly - 2 sets - 10 reps  R Side lying hip abduction for LLE hip strength: 2x12, min multimodal cues for form/technique with good carryover.    Hook lying SLR: 2x10, Noted anterior hip tightness limiting neutral hip extension bilaterally.   Heel raises: 2x12, BUE support.  Hip flexion marches, alternating: 2x12, BUE support.   Education on L sided half moon stretch to improve tissue flexibility on L sided paraspinals, lats, QL.   PATIENT EDUCATION:  Education details: form/technique with exercise. Person educated: Patient Education method: Explanation, Demonstration, and Handouts Education comprehension: verbalized understanding   HOME EXERCISE PROGRAM: Access Code: [email or web address] URL: [email or web address] Date: 01/27/2022 Prepared by: Valentina Gu  Exercises - Supine Lower Trunk Rotation  - 2 x daily - 7 x weekly - 2 sets - 10 reps - Supine Bridge  - 1 x daily - 7 x weekly - 2 sets - 10 reps - Seated Hip Adduction Isometrics with Ball  - 2 x daily - 7 x weekly - 2 sets - 10 reps - 5sec hold - Seated Hip Abduction with  Resistance  - 2 x daily - 7 x weekly - 2 sets - 10 reps   ASSESSMENT:  CLINICAL IMPRESSION: Continuing PT POC with review of HEP. Pt does have some LBP with seated hip abduction thus experimented with varying resistance levels and alternate positions. Education provided on progressions and regression based off of pain response to low back with pt verbalizing understanding. Continuing OKC exercises to prevent onset of L hip pain with success. Pt will continue to benefit from skilled PT services to reduce LBP and L hip pain to return to PLOF.   REHAB POTENTIAL: Good  CLINICAL DECISION MAKING: Evolving/moderate complexity  EVALUATION COMPLEXITY: Moderate   GOALS:  SHORT TERM GOALS: Target date: 02/24/2022  Pt will be independent with HEP to improve strength and decrease back pain to improve pain-free function at home and work. Baseline: 01/27/22: Baseline HEP initiated Goal status: INITIAL   LONG TERM GOALS: Target date: 03/31/2022  Pt will increase FOTO to at least 55 to demonstrate significant improvement in function at home and work related to back pain  Baseline: 01/27/22: 43 Goal status: INITIAL  2.  Pt will decrease worst hip pain by at least 3 points on the NPRS in order to demonstrate clinically significant reduction in hip pain. Baseline: 01/27/22: 10/10 at worst Goal status: INITIAL  3.  Pt will improve tested lower extremity manual muscle tests to at least 4+ or greater for all motions without reproduction of hip pain indicative of improved tolerance to loading and strength as needed for improved ability to perform transfers and loading-bearing tasks Baseline: 01/27/22: MMTs 4- to 4+ with exception of 5/5 R ankle dorsiflexion Goal status: INITIAL  4.  Patient will demonstrate normalized gait pattern with no significant compensated Trendelenburg or lurch for home-mobility distance (150 ft or greater) indicative of improved ability to negotiate her home Baseline: 01/27/22: Pt  demonstrates forward flexed posture, dec stance time on LLE, compensated Trendelenburg Goal status: INITIAL  5.  Patient will perform sit to stand with no UE support without reproduction of symptoms indicative of improved functional LE strength as needed for transferring and power production to prevent fall during episode of LOB Baseline: 01/27/22: heavy UE assist for sit to stand Goal status: INITIAL  PLAN: PT FREQUENCY: 2x/week  PT DURATION: 8 weeks  PLANNED INTERVENTIONS: Therapeutic exercises, Therapeutic activity, Neuromuscular re-education, Balance training, Gait training, Patient/Family education, Joint manipulation, Joint mobilization, Dry Needling, Electrical stimulation, Spinal mobilization, Cryotherapy, Moist heat, Traction, and Manual therapy  PLAN FOR NEXT SESSION: Manual techniques and modalities prn to improve sensitivity of L hip and gluteal muscles. Review HEP. Continue with low-impact hip and general LE strengthening. Gradually progress with closed-chain drills as able.  Salem Caster. Fairly IV, PT, DPT Physical Therapist- [facility]  02/03/2022, 2:15 PM

## 2022-02-05 ENCOUNTER — Ambulatory Visit: Payer: Medicare Other

## 2022-02-05 DIAGNOSIS — R278 Other lack of coordination: Secondary | ICD-10-CM

## 2022-02-05 DIAGNOSIS — R262 Difficulty in walking, not elsewhere classified: Secondary | ICD-10-CM

## 2022-02-05 DIAGNOSIS — M6281 Muscle weakness (generalized): Secondary | ICD-10-CM

## 2022-02-05 DIAGNOSIS — M25552 Pain in left hip: Secondary | ICD-10-CM | POA: Diagnosis not present

## 2022-02-05 NOTE — Therapy (Signed)
OUTPATIENT PHYSICAL THERAPY LOWER QUARTER TREATMENT   Patient Name: [name] MRN: [id number] DOB:11-25-1945, 76 y.o., female Today's Date: 02/05/2022   PT End of Session - 02/05/22 1327     Visit Number 3    Number of Visits 17    Authorization Type Medicare 2023, VL based on medical necessity    Progress Note Due on Visit 10    PT Start Time 1330    PT Stop Time 1414    PT Time Calculation (min) 44 min    Activity Tolerance Patient tolerated treatment well    Behavior During Therapy WFL for tasks assessed/performed             Past Medical History:  Diagnosis Date   Diabetes mellitus    DVT (deep venous thrombosis) (HCC)    Foot fracture, right    History of back surgery 1980   X2   History of colon polyps    Hyperlipidemia    Osteoporosis    Pneumonia    Vitamin D deficiency    Past Surgical History:  Procedure Laterality Date   ABDOMINAL HYSTERECTOMY     total   [facility]    CARDIOVASCULAR STRESS TEST     abnormal, had cath which was normal   CARPAL TUNNEL RELEASE     CHOLECYSTECTOMY     COLECTOMY     for benign polyps   GLAUCOMA SURGERY     [facility]   Patient Active Problem List   Diagnosis Date Noted   Coronary artery disease due to lipid rich plaque 03/09/2015   DJD (degenerative joint disease), cervical 08/15/2014   Right shoulder pain 07/03/2014   Hypomagnesemia 03/13/2014   Nephrolithiasis 10/10/2013   Medicare annual wellness visit, subsequent 03/10/2013   Postmenopausal estrogen deficiency 03/10/2013   Hyperlipidemia 02/21/2013   Diabetes mellitus type 2 in obese ([place]) 12/06/2012   Right hip pain 09/06/2012   Morbid obesity ([place]) 01/09/2011   Osteopenia 12/25/2010    PCP: Nelda Marseille, MD  REFERRING PROVIDER: Nelda Marseille*  REFERRING DIAGNOSIS: P38.25 (ICD-10-CM) - Unilateral primary osteoarthritis, left hip  THERAPY DIAG: Pain in left hip  Muscle  weakness (generalized)  Difficulty in walking, not elsewhere classified  Other lack of coordination  RATIONALE FOR EVALUATION AND TREATMENT: Rehabilitation  ONSET DATE: 12/2020  FOLLOW UP APPT WITH PROVIDER: Yes , f/u with Dr. Larene Beach PA in October   SUBJECTIVE:  Chief Complaint: Patient is a 76 year old female referred for left hip pain with med hx including Type 2 DM, HLD, osteoporosis, Vitamin D deficiency, hx of DVT, and hx of back surgery x 2.   Pertinent History Patient is a 76 year old female referred for left hip pain with med hx including Type 2 DM, stage 3 kidney failure, HLD, osteoporosis, Vitamin D deficiency, hx of DVT, and hx of back surgery x 2. Patient currently reports insidious onset of pain, atraumatic onset (patient reports no Hx of trauma or hip injury). Patient reports lateral hip and anterior hip pain with referral to L groin region. Pt reports that she has been told she may need THA. Patient reports mild neuropathy in her feet. Pt denies NT in her lower limbs otherwise. Patient reports most of the time, she does well with sleeping/lying down. Pt reports RLE did almost give way in grocery store one time; she had to grab her cart to not fall. No relief with OTC pain medications. Patient reports increased urinary urgency for which she had previous episode of pelvic floor PT. Pt has upcoming cataract surgeries in October, both eyes to be done in 2nd and 4th week of the month.   Pain:  Pain Intensity: Present: 5/10, Best: 0/10, Worst: 10/10 Pain location: R lateral hip, anterior hip, groin Pain Quality: dull, throbbing Radiating: No  Numbness/Tingling: No Focal Weakness: Yes, side of hip/leg  Aggravating factors: sitting, prolonged walking (gets weaker) Relieving factors: lying down 24-hour  pain behavior: worse later in the day How long can you sit: no specific time limit to sitting History of prior hip/back injury, pain, surgery, or therapy: Yes, Hx of backsurgery x 2 Falls: Has patient fallen in last 6 months? No, Number of falls: N/A Follow-up appointment with MD: Yes, in October  Imaging: Yes , radiograph of L hip (August 2022)  Moderate joint space narrowing of both hips. 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 Enesthopathy of distal insertion of gluteal tendons.   Prior level of function: Independent with community mobility without device Hobbies: reading, crochet, gardening (vegetables) Red flags (bowel/bladder changes, saddle paresthesia, personal history of cancer, h/o spinal tumors, h/o compression fx, h/o abdominal aneurysm, abdominal pain, chills/fever, night sweats, nausea, vomiting, unrelenting pain, first onset of insidious LBP <20 y/o): Negative  Precautions: None  Weight Bearing Restrictions: No  Living Environment Lives with: lives alone, pt has 2 daughters who live locally Lives in: House/apartment, pt has ramp to get into home Has following equipment at home: None   Patient Goals: No falls, able to walk better     OBJECTIVE:   Patient Surveys  FOTO 43, predicted outcome score of 55   Cognition Patient is oriented to person, place, and time.  Recent memory is intact.  Remote memory is intact.  Attention span and concentration are intact.  Expressive speech is intact.  Patient's fund of knowledge is within normal limits for educational level.     Gross Musculoskeletal Assessment Tremor: None Bulk: Normal Tone: Normal No visible step-off along spinal column, no signs of scoliosis   GAIT: Pt demonstrates forward flexed posture, dec stance time on LLE, compensated Trendelenburg   Posture: Lumbar lordosis: WNL Elevated scapulae, increased thoracic kyphosis, dec lumbar lordosis Pt sits with weight shifted to R ischium    AROM  AROM (Normal  range in degrees) AROM  02/05/2022  Lumbar   Flexion (65) 100% stiff with return to neural   Extension (30) 75%  Right lateral flexion (25) 50% pulls on L  hip*  Left lateral flexion (25) 25%*  Right rotation (30) 25%*  Left rotation (30) 75%      Hip Right Left  Flexion (125) 90 90*  Extension (15)    Abduction (40)    Adduction  35 30*  Internal Rotation (45) 45 30  External Rotation (45) 30 30      (* = pain; Blank rows = not tested)   LE MMT:  MMT (out of 5) Right 02/05/2022 Left 02/05/2022  Hip flexion 4 4-*  Hip extension    Hip abduction (seated) 4 4  Hip adduction 4 4  Hip internal rotation    Hip external rotation    Knee flexion 4 4-*  Knee extension 4+ 4  Ankle dorsiflexion 5 4  Ankle plantarflexion    Ankle inversion    Ankle eversion    (* = pain; Blank rows = not tested)   Sensation Grossly intact to light touch bilateral LEs as determined by testing dermatomes L2-S2. Proprioception, and hot/cold testing deferred on this date.   Muscle Length Hamstrings: R: Negative L: Lacking 20 deg extension (onset of L iliolumbar and L hip pain*)   Palpation  Location LEFT  RIGHT           Lumbar paraspinals  1  Quadratus Lumborum    Gluteus Maximus  2  Gluteus Medius  2  Deep hip external rotators  2  PSIS  2  Fortin's Area (SIJ)    Greater Trochanter  2  Hip flexors  2  (Blank rows = not tested) Graded on 0-4 scale (0 = no pain, 1 = pain, 2 = pain with wincing/grimacing/flinching, 3 = pain with withdrawal, 4 = unwilling to allow palpation), (Blank rows = not tested)     SPECIAL TESTS Lumbar Radiculopathy and Discogenic: Centralization and Peripheralization (SN 92, -LR 0.12): Not examined Slump (SN 83, -LR 0.32): R: Negative L: Positive for lateral hip pain  SLR (SN 92, -LR 0.29): R: Negative L:  Positive Crossed SLR (SP 90): R: Negative L: Negative Lumbar general traction in hooklying: Negative for pain relief   Hip: FABER (SN 81): R: Not  examined L: Positive FADIR (SN 94): R: Not examined L: Not examined Hip scour (SN 50): R: Not examined L: Positive  SIJ:  Thigh Thrust (SN 88, -LR 0.18) : R: Not examined L: Positive      TODAY'S TREATMENT : 02/03/22  SUBJECTIVE: Pt reports 7/10 L hip pain today. Some pain and soreness after last session.   Manual Therapy: 10 minutes total  Supine:    L hip manual distraction with mob belt: 4x30sec to increase joint mobility.    L hip AP grade(s) 2-3 mobs for pain modulation and improve hip mobility. Bouts of 10-15 sec, x10 in varying ranges of hip flexion.   There.ex:               Nu-Step L2 for 4 minutes for UE/LE use and gentle lumbar mobility.     Seated physioball roll outs for lumbar mobility: forwards and R/L lateral deviations x15/direction, 2-3 sec holds. Min VC's for decreasing speed of motion.              Supine Lower Trunk Rotation: 2x10/side  Supine Bridge : 2x10. VC's to prevent lumbar extension.   Hook lying marches: 2x10  Standing 4 way hip strengthening: 2x10/LE for all exercises. Min VC's for form/technique, neutral spine positioning.     Flexion    Abduction  Extension   L sided, modified Half moon stretch for L side QL/lat/ and paraspinal stretch with arms at side: 4x30 sec    Heel raises: 2x12     PATIENT EDUCATION:  Education details: form/technique with exercise. Person educated: Patient Education method: Explanation, Demonstration, and Handouts Education comprehension: verbalized understanding   HOME EXERCISE PROGRAM: Access Code: [email or web address] URL: [email or web address] Date: 01/27/2022 Prepared by: Valentina Gu  Exercises - Supine Lower Trunk Rotation  - 2 x daily - 7 x weekly - 2 sets - 10 reps - Supine Bridge  - 1 x daily - 7 x weekly - 2 sets - 10 reps - Seated Hip Adduction Isometrics with Ball  - 2 x daily - 7 x weekly - 2 sets - 10 reps - 5sec hold - Seated Hip Abduction with Resistance  - 2 x daily - 7 x  weekly - 2 sets - 10 reps   ASSESSMENT:  CLINICAL IMPRESSION: Increased pain levels noted today in hip prior to treatment. Use of manual interventions which demonstrates improvement to L hip pain indicative some of pt's back pain is related to hip pathology. Focus of session on [place] strengthening to prevent WB pain most likely related to OA changes based off of description of pain and provocative positions. Pt reports decreased hip pain with [place] intervention but pain remains the same post treatment endorsing LE fatigue with exercise. Pt will continue to benefit from skilled PT services to reduce LBP and L hip pain to return to PLOF.     REHAB POTENTIAL: Good  CLINICAL DECISION MAKING: Evolving/moderate complexity  EVALUATION COMPLEXITY: Moderate   GOALS:  SHORT TERM GOALS: Target date: 02/26/2022  Pt will be independent with HEP to improve strength and decrease back pain to improve pain-free function at home and work. Baseline: 01/27/22: Baseline HEP initiated Goal status: INITIAL   LONG TERM GOALS: Target date: 04/02/2022  Pt will increase FOTO to at least 55 to demonstrate significant improvement in function at home and work related to back pain  Baseline: 01/27/22: 43 Goal status: INITIAL  2.  Pt will decrease worst hip pain by at least 3 points on the NPRS in order to demonstrate clinically significant reduction in hip pain. Baseline: 01/27/22: 10/10 at worst Goal status: INITIAL  3.  Pt will improve tested lower extremity manual muscle tests to at least 4+ or greater for all motions without reproduction of hip pain indicative of improved tolerance to loading and strength as needed for improved ability to perform transfers and loading-bearing tasks Baseline: 01/27/22: MMTs 4- to 4+ with exception of 5/5 R ankle dorsiflexion Goal status: INITIAL  4.  Patient will demonstrate normalized gait pattern with no significant compensated Trendelenburg or lurch for home-mobility  distance (150 ft or greater) indicative of improved ability to negotiate her home Baseline: 01/27/22: Pt demonstrates forward flexed posture, dec stance time on LLE, compensated Trendelenburg Goal status: INITIAL  5.  Patient will perform sit to stand with no UE support without reproduction of symptoms indicative of improved functional LE strength as needed for transferring and power production to prevent fall during episode of LOB Baseline: 01/27/22: heavy UE assist for sit to stand Goal status: INITIAL  PLAN: PT FREQUENCY: 2x/week  PT DURATION: 8 weeks  PLANNED INTERVENTIONS: Therapeutic exercises, Therapeutic activity, Neuromuscular re-education, Balance training, Gait training, Patient/Family education, Joint manipulation, Joint mobilization, Dry Needling, Electrical stimulation, Spinal mobilization, Cryotherapy, Moist heat, Traction, and Manual therapy  PLAN FOR NEXT SESSION: Manual techniques and modalities prn  to improve sensitivity of L hip and gluteal muscles. Review HEP. Continue with low-impact hip and general LE strengthening. Gradually progress with closed-chain drills as able.     Salem Caster. Fairly IV, PT, DPT Physical Therapist- [facility]  02/05/2022, 2:14 PM

## 2022-02-10 ENCOUNTER — Ambulatory Visit: Payer: Medicare Other | Attending: Internal Medicine

## 2022-02-10 DIAGNOSIS — R262 Difficulty in walking, not elsewhere classified: Secondary | ICD-10-CM | POA: Insufficient documentation

## 2022-02-10 DIAGNOSIS — M25552 Pain in left hip: Secondary | ICD-10-CM | POA: Diagnosis present

## 2022-02-10 DIAGNOSIS — M6281 Muscle weakness (generalized): Secondary | ICD-10-CM | POA: Diagnosis present

## 2022-02-10 DIAGNOSIS — R278 Other lack of coordination: Secondary | ICD-10-CM | POA: Insufficient documentation

## 2022-02-10 NOTE — Therapy (Signed)
OUTPATIENT PHYSICAL THERAPY LOWER QUARTER TREATMENT   Patient Name: [name] MRN: [id number] DOB:08-21-1945, 76 y.o., female Today's Date: 02/10/2022   PT End of Session - 02/10/22 1337     Visit Number 4    Number of Visits 17    Authorization Type Medicare 2023, VL based on medical necessity    Progress Note Due on Visit 10    PT Start Time 1330    PT Stop Time 1415    PT Time Calculation (min) 45 min    Activity Tolerance Patient tolerated treatment well    Behavior During Therapy WFL for tasks assessed/performed             Past Medical History:  Diagnosis Date   Diabetes mellitus    DVT (deep venous thrombosis) (HCC)    Foot fracture, right    History of back surgery 1980   X2   History of colon polyps    Hyperlipidemia    Osteoporosis    Pneumonia    Vitamin D deficiency    Past Surgical History:  Procedure Laterality Date   ABDOMINAL HYSTERECTOMY     total   [facility]    CARDIOVASCULAR STRESS TEST     abnormal, had cath which was normal   CARPAL TUNNEL RELEASE     CHOLECYSTECTOMY     COLECTOMY     for benign polyps   GLAUCOMA SURGERY     [facility]   Patient Active Problem List   Diagnosis Date Noted   Coronary artery disease due to lipid rich plaque 03/09/2015   DJD (degenerative joint disease), cervical 08/15/2014   Right shoulder pain 07/03/2014   Hypomagnesemia 03/13/2014   Nephrolithiasis 10/10/2013   Medicare annual wellness visit, subsequent 03/10/2013   Postmenopausal estrogen deficiency 03/10/2013   Hyperlipidemia 02/21/2013   Diabetes mellitus type 2 in obese ([place]) 12/06/2012   Right hip pain 09/06/2012   Morbid obesity ([place]) 01/09/2011   Osteopenia 12/25/2010    PCP: Nelda Marseille, MD  REFERRING PROVIDER: Nelda Marseille*  REFERRING DIAGNOSIS: Z60.10 (ICD-10-CM) - Unilateral primary osteoarthritis, left hip  THERAPY DIAG: Pain in left hip  Muscle  weakness (generalized)  RATIONALE FOR EVALUATION AND TREATMENT: Rehabilitation  ONSET DATE: 12/2020  FOLLOW UP APPT WITH PROVIDER: Yes , f/u with Dr. Larene Beach PA in October   SUBJECTIVE:                                                                                                                                                                                         Chief Complaint:  Patient is a 76 year old female referred for left hip pain with med hx including Type 2 DM, HLD, osteoporosis, Vitamin D deficiency, hx of DVT, and hx of back surgery x 2.   Pertinent History Patient is a 76 year old female referred for left hip pain with med hx including Type 2 DM, stage 3 kidney failure, HLD, osteoporosis, Vitamin D deficiency, hx of DVT, and hx of back surgery x 2. Patient currently reports insidious onset of pain, atraumatic onset (patient reports no Hx of trauma or hip injury). Patient reports lateral hip and anterior hip pain with referral to L groin region. Pt reports that she has been told she may need THA. Patient reports mild neuropathy in her feet. Pt denies NT in her lower limbs otherwise. Patient reports most of the time, she does well with sleeping/lying down. Pt reports RLE did almost give way in grocery store one time; she had to grab her cart to not fall. No relief with OTC pain medications. Patient reports increased urinary urgency for which she had previous episode of pelvic floor PT. Pt has upcoming cataract surgeries in October, both eyes to be done in 2nd and 4th week of the month.   Pain:  Pain Intensity: Present: 5/10, Best: 0/10, Worst: 10/10 Pain location: R lateral hip, anterior hip, groin Pain Quality: dull, throbbing Radiating: No  Numbness/Tingling: No Focal Weakness: Yes, side of hip/leg  Aggravating factors: sitting, prolonged walking (gets weaker) Relieving factors: lying down 24-hour pain behavior: worse later in the day How long can you sit: no specific time  limit to sitting History of prior hip/back injury, pain, surgery, or therapy: Yes, Hx of backsurgery x 2 Falls: Has patient fallen in last 6 months? No, Number of falls: N/A Follow-up appointment with MD: Yes, in October  Imaging: Yes , radiograph of L hip (August 2022)  Moderate joint space narrowing of both hips. 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 Enesthopathy of distal insertion of gluteal tendons.   Prior level of function: Independent with community mobility without device Hobbies: reading, crochet, gardening (vegetables) Red flags (bowel/bladder changes, saddle paresthesia, personal history of cancer, h/o spinal tumors, h/o compression fx, h/o abdominal aneurysm, abdominal pain, chills/fever, night sweats, nausea, vomiting, unrelenting pain, first onset of insidious LBP <20 y/o): Negative  Precautions: None  Weight Bearing Restrictions: No  Living Environment Lives with: lives alone, pt has 2 daughters who live locally Lives in: House/apartment, pt has ramp to get into home Has following equipment at home: None   Patient Goals: No falls, able to walk better     OBJECTIVE:   Patient Surveys  FOTO 43, predicted outcome score of 55   Cognition Patient is oriented to person, place, and time.  Recent memory is intact.  Remote memory is intact.  Attention span and concentration are intact.  Expressive speech is intact.  Patient's fund of knowledge is within normal limits for educational level.     Gross Musculoskeletal Assessment Tremor: None Bulk: Normal Tone: Normal No visible step-off along spinal column, no signs of scoliosis   GAIT: Pt demonstrates forward flexed posture, dec stance time on LLE, compensated Trendelenburg   Posture: Lumbar lordosis: WNL Elevated scapulae, increased thoracic kyphosis, dec lumbar lordosis Pt sits with weight shifted to R ischium    AROM  AROM (Normal range in degrees) AROM  02/10/2022  Lumbar   Flexion (65) 100% stiff with  return to neural   Extension (30) 75%  Right lateral flexion (25) 50% pulls on L hip*  Left lateral flexion (25) 25%*  Right rotation (30) 25%*  Left rotation (30) 75%      Hip Right Left  Flexion (125) 90 90*  Extension (15)    Abduction (40)    Adduction  35 30*  Internal Rotation (45) 45 30  External Rotation (45) 30 30      (* = pain; Blank rows = not tested)   LE MMT:  MMT (out of 5) Right 02/10/2022 Left 02/10/2022  Hip flexion 4 4-*  Hip extension    Hip abduction (seated) 4 4  Hip adduction 4 4  Hip internal rotation    Hip external rotation    Knee flexion 4 4-*  Knee extension 4+ 4  Ankle dorsiflexion 5 4  Ankle plantarflexion    Ankle inversion    Ankle eversion    (* = pain; Blank rows = not tested)   Sensation Grossly intact to light touch bilateral LEs as determined by testing dermatomes L2-S2. Proprioception, and hot/cold testing deferred on this date.   Muscle Length Hamstrings: R: Negative L: Lacking 20 deg extension (onset of L iliolumbar and L hip pain*)   Palpation  Location LEFT  RIGHT           Lumbar paraspinals  1  Quadratus Lumborum    Gluteus Maximus  2  Gluteus Medius  2  Deep hip external rotators  2  PSIS  2  Fortin's Area (SIJ)    Greater Trochanter  2  Hip flexors  2  (Blank rows = not tested) Graded on 0-4 scale (0 = no pain, 1 = pain, 2 = pain with wincing/grimacing/flinching, 3 = pain with withdrawal, 4 = unwilling to allow palpation), (Blank rows = not tested)     SPECIAL TESTS Lumbar Radiculopathy and Discogenic: Centralization and Peripheralization (SN 92, -LR 0.12): Not examined Slump (SN 83, -LR 0.32): R: Negative L: Positive for lateral hip pain  SLR (SN 92, -LR 0.29): R: Negative L:  Positive Crossed SLR (SP 90): R: Negative L: Negative Lumbar general traction in hooklying: Negative for pain relief   Hip: FABER (SN 81): R: Not examined L: Positive FADIR (SN 94): R: Not examined L: Not examined Hip scour  (SN 50): R: Not examined L: Positive  SIJ:  Thigh Thrust (SN 88, -LR 0.18) : R: Not examined L: Positive      TODAY'S TREATMENT : 02/03/22  SUBJECTIVE: Pt reports 7/10 L hip pain today. She notes that sitting is painful for her L hip and prolonged standing.   Manual Therapy: 10 minutes total  Supine:    L hip manual distraction with mob belt: 4x30sec to increase joint mobility.    L hip AP grade(s) 2-3 mobs for pain modulation and improve hip mobility. Bouts of 10-15 sec, x10 in varying ranges of hip flexion.   There.ex:               Nu-Step L2 for 4 minutes for UE/LE use and gentle lumbar mobility.     Seated physioball roll outs for lumbar mobility: forwards and R/L lateral deviations x15/direction, 2-3 sec holds. Min VC's for decreasing speed of motion.              Supine Lower Trunk Rotation: 2x10/side  Supine Bridge : 2x10. VC's to prevent lumbar extension.   Hook lying marches: 2x10  Standing 4 way hip strengthening: 2x10/LE for all exercises. Min VC's for form/technique, neutral spine positioning.     Flexion  Abduction     Extension   L sided, modified Half moon stretch for L side QL/lat/ and paraspinal stretch with arms at side: 4x30 sec    Heel raises: 2x12     PATIENT EDUCATION:  Education details: form/technique with exercise. Person educated: Patient Education method: Explanation, Demonstration, and Handouts Education comprehension: verbalized understanding   HOME EXERCISE PROGRAM: Access Code: [email or web address] URL: [email or web address] Date: 01/27/2022 Prepared by: Valentina Gu  Exercises - Supine Lower Trunk Rotation  - 2 x daily - 7 x weekly - 2 sets - 10 reps - Supine Bridge  - 1 x daily - 7 x weekly - 2 sets - 10 reps - Seated Hip Adduction Isometrics with Ball  - 2 x daily - 7 x weekly - 2 sets - 10 reps - 5sec hold - Seated Hip Abduction with Resistance  - 2 x daily - 7 x weekly - 2 sets - 10 reps   ASSESSMENT:  CLINICAL  IMPRESSION: Pt continues to have short term relief of L hip pain with manual therapy interventions; carryover though does not occur when she transitions to standing positions afterwards.  She does still amb with antalgic gait.  May benefit from further discussion of using AD for pain control/normal gait promotion.  Pt will continue to benefit from skilled PT services to reduce LBP and L hip pain to return to PLOF.     REHAB POTENTIAL: Good  CLINICAL DECISION MAKING: Evolving/moderate complexity  EVALUATION COMPLEXITY: Moderate   GOALS:  SHORT TERM GOALS: Target date: 03/03/2022  Pt will be independent with HEP to improve strength and decrease back pain to improve pain-free function at home and work. Baseline: 01/27/22: Baseline HEP initiated Goal status: INITIAL   LONG TERM GOALS: Target date: 04/07/2022  Pt will increase FOTO to at least 55 to demonstrate significant improvement in function at home and work related to back pain  Baseline: 01/27/22: 43 Goal status: INITIAL  2.  Pt will decrease worst hip pain by at least 3 points on the NPRS in order to demonstrate clinically significant reduction in hip pain. Baseline: 01/27/22: 10/10 at worst Goal status: INITIAL  3.  Pt will improve tested lower extremity manual muscle tests to at least 4+ or greater for all motions without reproduction of hip pain indicative of improved tolerance to loading and strength as needed for improved ability to perform transfers and loading-bearing tasks Baseline: 01/27/22: MMTs 4- to 4+ with exception of 5/5 R ankle dorsiflexion Goal status: INITIAL  4.  Patient will demonstrate normalized gait pattern with no significant compensated Trendelenburg or lurch for home-mobility distance (150 ft or greater) indicative of improved ability to negotiate her home Baseline: 01/27/22: Pt demonstrates forward flexed posture, dec stance time on LLE, compensated Trendelenburg Goal status: INITIAL  5.  Patient will  perform sit to stand with no UE support without reproduction of symptoms indicative of improved functional LE strength as needed for transferring and power production to prevent fall during episode of LOB Baseline: 01/27/22: heavy UE assist for sit to stand Goal status: INITIAL  PLAN: PT FREQUENCY: 2x/week  PT DURATION: 8 weeks  PLANNED INTERVENTIONS: Therapeutic exercises, Therapeutic activity, Neuromuscular re-education, Balance training, Gait training, Patient/Family education, Joint manipulation, Joint mobilization, Dry Needling, Electrical stimulation, Spinal mobilization, Cryotherapy, Moist heat, Traction, and Manual therapy  PLAN FOR NEXT SESSION: Manual techniques and modalities prn to improve sensitivity of L hip and gluteal muscles. Review HEP. Continue with low-impact hip and general LE strengthening. Gradually progress with  closed-chain drills as able.     Merdis Delay, PT, DPT, OCS  [phone]  Physical Therapist- [facility]  02/10/2022, 5:35 PM

## 2022-02-12 ENCOUNTER — Ambulatory Visit: Payer: Medicare Other | Admitting: Physical Therapy

## 2022-02-12 DIAGNOSIS — M6281 Muscle weakness (generalized): Secondary | ICD-10-CM

## 2022-02-12 DIAGNOSIS — M25552 Pain in left hip: Secondary | ICD-10-CM | POA: Diagnosis not present

## 2022-02-12 DIAGNOSIS — R262 Difficulty in walking, not elsewhere classified: Secondary | ICD-10-CM

## 2022-02-12 NOTE — Therapy (Signed)
OUTPATIENT PHYSICAL THERAPY TREATMENT   Patient Name: [name] MRN: [id number] DOB:August 02, 1945, 76 y.o., female Today's Date: 02/12/2022   PT End of Session - 02/12/22 1334     Visit Number 5    Number of Visits 17    Authorization Type Medicare 2023, VL based on medical necessity    Progress Note Due on Visit 10    PT Start Time 1330    PT Stop Time 1410    PT Time Calculation (min) 40 min    Activity Tolerance Patient tolerated treatment well    Behavior During Therapy WFL for tasks assessed/performed              Past Medical History:  Diagnosis Date   Diabetes mellitus    DVT (deep venous thrombosis) ([place])    Foot fracture, right    History of back surgery 1980   X2   History of colon polyps    Hyperlipidemia    Osteoporosis    Pneumonia    Vitamin D deficiency    Past Surgical History:  Procedure Laterality Date   ABDOMINAL HYSTERECTOMY     total   [facility]    CARDIOVASCULAR STRESS TEST     abnormal, had cath which was normal   CARPAL TUNNEL RELEASE     CHOLECYSTECTOMY     COLECTOMY     for benign polyps   GLAUCOMA SURGERY     [facility]   Patient Active Problem List   Diagnosis Date Noted   Coronary artery disease due to lipid rich plaque 03/09/2015   DJD (degenerative joint disease), cervical 08/15/2014   Right shoulder pain 07/03/2014   Hypomagnesemia 03/13/2014   Nephrolithiasis 10/10/2013   Medicare annual wellness visit, subsequent 03/10/2013   Postmenopausal estrogen deficiency 03/10/2013   Hyperlipidemia 02/21/2013   Diabetes mellitus type 2 in obese ([place]) 12/06/2012   Right hip pain 09/06/2012   Morbid obesity ([place]) 01/09/2011   Osteopenia 12/25/2010    PCP: Nelda Marseille, MD  REFERRING PROVIDER: Nelda Marseille*  REFERRING DIAGNOSIS: V95.63 (ICD-10-CM) - Unilateral primary osteoarthritis, left hip  THERAPY DIAG: Pain in left hip  Muscle weakness  (generalized)  Difficulty in walking, not elsewhere classified  RATIONALE FOR EVALUATION AND TREATMENT: Rehabilitation  ONSET DATE: 12/2020  FOLLOW UP APPT WITH PROVIDER: Yes , f/u with Dr. Larene Beach PA in October   SUBJECTIVE (INITIAL EVAL):  Chief Complaint: Patient is a 76 year old female referred for left hip pain with med hx including Type 2 DM, HLD, osteoporosis, Vitamin D deficiency, hx of DVT, and hx of back surgery x 2.   Pertinent History Patient is a 76 year old female referred for left hip pain with med hx including Type 2 DM, stage 3 kidney failure, HLD, osteoporosis, Vitamin D deficiency, hx of DVT, and hx of back surgery x 2. Patient currently reports insidious onset of pain, atraumatic onset (patient reports no Hx of trauma or hip injury). Patient reports lateral hip and anterior hip pain with referral to L groin region. Pt reports that she has been told she may need THA. Patient reports mild neuropathy in her feet. Pt denies NT in her lower limbs otherwise. Patient reports most of the time, she does well with sleeping/lying down. Pt reports RLE did almost give way in grocery store one time; she had to grab her cart to not fall. No relief with OTC pain medications. Patient reports increased urinary urgency for which she had previous episode of pelvic floor PT. Pt has upcoming cataract surgeries in October, both eyes to be done in 2nd and 4th week of the month.   Pain:  Pain Intensity: Present: 5/10, Best: 0/10, Worst: 10/10 Pain location: R lateral hip, anterior hip, groin Pain Quality: dull, throbbing Radiating: No  Numbness/Tingling: No Focal Weakness: Yes, side of hip/leg  Aggravating factors: sitting, prolonged walking (gets weaker) Relieving factors: lying down 24-hour pain behavior: worse  later in the day How long can you sit: no specific time limit to sitting History of prior hip/back injury, pain, surgery, or therapy: Yes, Hx of backsurgery x 2 Falls: Has patient fallen in last 6 months? No, Number of falls: N/A Follow-up appointment with MD: Yes, in October  Imaging: Yes , radiograph of L hip (August 2022)  Moderate joint space narrowing of both hips. 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 Enesthopathy of distal insertion of gluteal tendons.   Prior level of function: Independent with community mobility without device Hobbies: reading, crochet, gardening (vegetables) Red flags (bowel/bladder changes, saddle paresthesia, personal history of cancer, h/o spinal tumors, h/o compression fx, h/o abdominal aneurysm, abdominal pain, chills/fever, night sweats, nausea, vomiting, unrelenting pain, first onset of insidious LBP <20 y/o): Negative  Precautions: None  Weight Bearing Restrictions: No  Living Environment Lives with: lives alone, pt has 2 daughters who live locally Lives in: House/apartment, pt has ramp to get into home Has following equipment at home: None   Patient Goals: No falls, able to walk better     OBJECTIVE:   Patient Surveys  FOTO 43, predicted outcome score of 55   Cognition Patient is oriented to person, place, and time.  Recent memory is intact.  Remote memory is intact.  Attention span and concentration are intact.  Expressive speech is intact.  Patient's fund of knowledge is within normal limits for educational level.     Gross Musculoskeletal Assessment Tremor: None Bulk: Normal Tone: Normal No visible step-off along spinal column, no signs of scoliosis   GAIT: Pt demonstrates forward flexed posture, dec stance time on LLE, compensated Trendelenburg   Posture: Lumbar lordosis: WNL Elevated scapulae, increased thoracic kyphosis, dec lumbar lordosis Pt sits with weight shifted to R ischium    AROM  AROM (Normal range in degrees) AROM   02/12/2022  Lumbar   Flexion (65) 100% stiff with return to neural   Extension (30) 75%  Right lateral flexion (25) 50% pulls on L  hip*  Left lateral flexion (25) 25%*  Right rotation (30) 25%*  Left rotation (30) 75%      Hip Right Left  Flexion (125) 90 90*  Extension (15)    Abduction (40)    Adduction  35 30*  Internal Rotation (45) 45 30  External Rotation (45) 30 30      (* = pain; Blank rows = not tested)   LE MMT:  MMT (out of 5) Right 02/12/2022 Left 02/12/2022  Hip flexion 4 4-*  Hip extension    Hip abduction (seated) 4 4  Hip adduction 4 4  Hip internal rotation    Hip external rotation    Knee flexion 4 4-*  Knee extension 4+ 4  Ankle dorsiflexion 5 4  Ankle plantarflexion    Ankle inversion    Ankle eversion    (* = pain; Blank rows = not tested)   Sensation Grossly intact to light touch bilateral LEs as determined by testing dermatomes L2-S2. Proprioception, and hot/cold testing deferred on this date.   Muscle Length Hamstrings: R: Negative L: Lacking 20 deg extension (onset of L iliolumbar and L hip pain*)   Palpation  Location LEFT  RIGHT           Lumbar paraspinals  1  Quadratus Lumborum    Gluteus Maximus  2  Gluteus Medius  2  Deep hip external rotators  2  PSIS  2  Fortin's Area (SIJ)    Greater Trochanter  2  Hip flexors  2  (Blank rows = not tested) Graded on 0-4 scale (0 = no pain, 1 = pain, 2 = pain with wincing/grimacing/flinching, 3 = pain with withdrawal, 4 = unwilling to allow palpation), (Blank rows = not tested)     SPECIAL TESTS Lumbar Radiculopathy and Discogenic: Centralization and Peripheralization (SN 92, -LR 0.12): Not examined Slump (SN 83, -LR 0.32): R: Negative L: Positive for lateral hip pain  SLR (SN 92, -LR 0.29): R: Negative L:  Positive Crossed SLR (SP 90): R: Negative L: Negative Lumbar general traction in hooklying: Negative for pain relief   Hip: FABER (SN 81): R: Not examined L: Positive FADIR  (SN 94): R: Not examined L: Not examined Hip scour (SN 50): R: Not examined L: Positive  SIJ:  Thigh Thrust (SN 88, -LR 0.18) : R: Not examined L: Positive      TODAY'S TREATMENT : 02/03/22  SUBJECTIVE: Pt reports 6-7/10 L hip pain today. She reports pain affecting groin and along lateral hip/greater trochanteric region. She is unsure if she is having a hard time with certain exercises at home (hip abduction and adduction work at home).    MHP (unbilled) utilized prior to manual therapy for analgesic effect and improved soft tissue extensibility; along L hip in R sidelying (pillow in between knees to improve positional tolerance); x 5 minutes    Manual Therapy: 20 minutes total R sidelying with pillow between legs:   STM and IASTM with [employer] along L gluteus medius/maximus x 10 minutes Supine:    L hip long-leg distraction in loose-packed position, increased degree of hip flexion to improve pt tolerance, 10 sec on, 5 sec off; x 5 minutes     There.ex:               Nu-Step L1 for 4 minutes for UE/LE use and gentle lumbar mobility.     -Decreased resistance due to L lateral hip pain    -Subjective information gathered during this  time      Hooklying adduction isometric for reciprocal inhibition and review for HEP; x10, 5 sec hold    Supine Bridge : 2x8 with verbal/tactile cueing for technique    Seated Hip abduction with resistance band; Red Tband; 2x10   PATIENT EDUCATION: Discussed modification of force applied with hip isometrics/isotonics at home and to further discuss with PT if pt is unable to perform without significant pain.       *not today* Seated physioball roll outs for lumbar mobility: forwards and R/L lateral deviations x15/direction, 2-3 sec holds. Min VC's for decreasing speed of motion.              Supine Lower Trunk Rotation: 2x10/side L sided, modified Half moon stretch for L side QL/lat/ and paraspinal stretch with arms at side: 4x30 sec   Heel  raises: 2x12 Standing 3-way hip strengthening: 2x10/LE for all exercises. Min VC's for form/technique, neutral spine positioning.  Hook lying marches: 2x10    PATIENT EDUCATION:  Education details: form/technique with exercise. Person educated: Patient Education method: Explanation, Demonstration, and Handouts Education comprehension: verbalized understanding   HOME EXERCISE PROGRAM: Access Code: [email or web address] URL: [email or web address] Date: 01/27/2022 Prepared by: Valentina Gu  Exercises - Supine Lower Trunk Rotation  - 2 x daily - 7 x weekly - 2 sets - 10 reps - Supine Bridge  - 1 x daily - 7 x weekly - 2 sets - 10 reps - Seated Hip Adduction Isometrics with Ball  - 2 x daily - 7 x weekly - 2 sets - 10 reps - 5sec hold - Seated Hip Abduction with Resistance  - 2 x daily - 7 x weekly - 2 sets - 10 reps   ASSESSMENT:  CLINICAL IMPRESSION: Patient has remaining difficulty with HEP performance, voicing difficulty with performing hip adduction isometric exercise and seated hip abduction with resistance band. Reviewed exercises today with discussion on modification to ROM and force applied to ensure pain is not increasing. Pt has ongoing L hip ROM deficits and will benefit from further MWM strategies to improve hip mobility. Will consider use of AD to offload hip given context of significant compensated Trendelenburg and pain with weightbearing on L hip. Pt will continue to benefit from skilled PT services to reduce LBP and L hip pain to return to PLOF.     REHAB POTENTIAL: Good  CLINICAL DECISION MAKING: Evolving/moderate complexity  EVALUATION COMPLEXITY: Moderate   GOALS:  SHORT TERM GOALS: Target date: 03/05/2022  Pt will be independent with HEP to improve strength and decrease back pain to improve pain-free function at home and work. Baseline: 01/27/22: Baseline HEP initiated Goal status: INITIAL   LONG TERM GOALS: Target date: 04/09/2022  Pt will  increase FOTO to at least 55 to demonstrate significant improvement in function at home and work related to back pain  Baseline: 01/27/22: 43 Goal status: INITIAL  2.  Pt will decrease worst hip pain by at least 3 points on the NPRS in order to demonstrate clinically significant reduction in hip pain. Baseline: 01/27/22: 10/10 at worst Goal status: INITIAL  3.  Pt will improve tested lower extremity manual muscle tests to at least 4+ or greater for all motions without reproduction of hip pain indicative of improved tolerance to loading and strength as needed for improved ability to perform transfers and loading-bearing tasks Baseline: 01/27/22: MMTs 4- to 4+ with exception of 5/5 R ankle dorsiflexion Goal status: INITIAL  4.  Patient will demonstrate normalized gait pattern with  no significant compensated Trendelenburg or lurch for home-mobility distance (150 ft or greater) indicative of improved ability to negotiate her home Baseline: 01/27/22: Pt demonstrates forward flexed posture, dec stance time on LLE, compensated Trendelenburg Goal status: INITIAL  5.  Patient will perform sit to stand with no UE support without reproduction of symptoms indicative of improved functional LE strength as needed for transferring and power production to prevent fall during episode of LOB Baseline: 01/27/22: heavy UE assist for sit to stand Goal status: INITIAL  PLAN: PT FREQUENCY: 2x/week  PT DURATION: 8 weeks  PLANNED INTERVENTIONS: Therapeutic exercises, Therapeutic activity, Neuromuscular re-education, Balance training, Gait training, Patient/Family education, Joint manipulation, Joint mobilization, Dry Needling, Electrical stimulation, Spinal mobilization, Cryotherapy, Moist heat, Traction, and Manual therapy  PLAN FOR NEXT SESSION: Manual techniques and modalities prn to improve sensitivity of L hip and gluteal muscles. Review HEP. Continue with low-impact hip and general LE strengthening. Gradually  progress with closed-chain drills as able.     Valentina Gu, PT, DPT #[id number]  Eilleen Kempf 02/12/2022, 1:34 PM

## 2022-02-13 ENCOUNTER — Encounter: Payer: Self-pay | Admitting: Physical Therapy

## 2022-02-17 ENCOUNTER — Ambulatory Visit: Payer: Medicare Other | Admitting: Physical Therapy

## 2022-02-17 DIAGNOSIS — M25552 Pain in left hip: Secondary | ICD-10-CM | POA: Diagnosis not present

## 2022-02-17 DIAGNOSIS — M6281 Muscle weakness (generalized): Secondary | ICD-10-CM

## 2022-02-17 DIAGNOSIS — R278 Other lack of coordination: Secondary | ICD-10-CM

## 2022-02-17 DIAGNOSIS — R262 Difficulty in walking, not elsewhere classified: Secondary | ICD-10-CM

## 2022-02-17 NOTE — Therapy (Unsigned)
OUTPATIENT PHYSICAL THERAPY TREATMENT   Patient Name: [name] MRN: [id number] DOB:03-21-1946, 76 y.o., female Today's Date: 02/17/2022   PT End of Session - 02/17/22 1404     Visit Number 6    Number of Visits 17    Authorization Type Medicare 2023, VL based on medical necessity    Progress Note Due on Visit 10    PT Start Time 1330    PT Stop Time 1414    PT Time Calculation (min) 44 min    Activity Tolerance Patient tolerated treatment well    Behavior During Therapy WFL for tasks assessed/performed             Past Medical History:  Diagnosis Date   Diabetes mellitus    DVT (deep venous thrombosis) ([place])    Foot fracture, right    History of back surgery 1980   X2   History of colon polyps    Hyperlipidemia    Osteoporosis    Pneumonia    Vitamin D deficiency    Past Surgical History:  Procedure Laterality Date   ABDOMINAL HYSTERECTOMY     total   [facility]    CARDIOVASCULAR STRESS TEST     abnormal, had cath which was normal   CARPAL TUNNEL RELEASE     CHOLECYSTECTOMY     COLECTOMY     for benign polyps   GLAUCOMA SURGERY     [facility]   Patient Active Problem List   Diagnosis Date Noted   Coronary artery disease due to lipid rich plaque 03/09/2015   DJD (degenerative joint disease), cervical 08/15/2014   Right shoulder pain 07/03/2014   Hypomagnesemia 03/13/2014   Nephrolithiasis 10/10/2013   Medicare annual wellness visit, subsequent 03/10/2013   Postmenopausal estrogen deficiency 03/10/2013   Hyperlipidemia 02/21/2013   Diabetes mellitus type 2 in obese ([place]) 12/06/2012   Right hip pain 09/06/2012   Morbid obesity ([place]) 01/09/2011   Osteopenia 12/25/2010    PCP: Nelda Marseille, MD  REFERRING PROVIDER: Nelda Marseille*  REFERRING DIAGNOSIS: X10.62 (ICD-10-CM) - Unilateral primary osteoarthritis, left hip  THERAPY DIAG: Pain in left hip  Muscle weakness  (generalized)  Difficulty in walking, not elsewhere classified  Other lack of coordination  RATIONALE FOR EVALUATION AND TREATMENT: Rehabilitation  ONSET DATE: 12/2020  FOLLOW UP APPT WITH PROVIDER: Yes , f/u with Dr. Larene Beach PA in October   SUBJECTIVE (INITIAL EVAL):  Chief Complaint: Patient is a 76 year old female referred for left hip pain with med hx including Type 2 DM, HLD, osteoporosis, Vitamin D deficiency, hx of DVT, and hx of back surgery x 2.   Pertinent History Patient is a 76 year old female referred for left hip pain with med hx including Type 2 DM, stage 3 kidney failure, HLD, osteoporosis, Vitamin D deficiency, hx of DVT, and hx of back surgery x 2. Patient currently reports insidious onset of pain, atraumatic onset (patient reports no Hx of trauma or hip injury). Patient reports lateral hip and anterior hip pain with referral to L groin region. Pt reports that she has been told she may need THA. Patient reports mild neuropathy in her feet. Pt denies NT in her lower limbs otherwise. Patient reports most of the time, she does well with sleeping/lying down. Pt reports RLE did almost give way in grocery store one time; she had to grab her cart to not fall. No relief with OTC pain medications. Patient reports increased urinary urgency for which she had previous episode of pelvic floor PT. Pt has upcoming cataract surgeries in October, both eyes to be done in 2nd and 4th week of the month.   Pain:  Pain Intensity: Present: 5/10, Best: 0/10, Worst: 10/10 Pain location: R lateral hip, anterior hip, groin Pain Quality: dull, throbbing Radiating: No  Numbness/Tingling: No Focal Weakness: Yes, side of hip/leg  Aggravating factors: sitting, prolonged walking (gets weaker) Relieving factors: lying  down 24-hour pain behavior: worse later in the day How long can you sit: no specific time limit to sitting History of prior hip/back injury, pain, surgery, or therapy: Yes, Hx of backsurgery x 2 Falls: Has patient fallen in last 6 months? No, Number of falls: N/A Follow-up appointment with MD: Yes, in October  Imaging: Yes , radiograph of L hip (August 2022)  Moderate joint space narrowing of both hips. 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 Enesthopathy of distal insertion of gluteal tendons.   Prior level of function: Independent with community mobility without device Hobbies: reading, crochet, gardening (vegetables) Red flags (bowel/bladder changes, saddle paresthesia, personal history of cancer, h/o spinal tumors, h/o compression fx, h/o abdominal aneurysm, abdominal pain, chills/fever, night sweats, nausea, vomiting, unrelenting pain, first onset of insidious LBP <20 y/o): Negative  Precautions: None  Weight Bearing Restrictions: No  Living Environment Lives with: lives alone, pt has 2 daughters who live locally Lives in: House/apartment, pt has ramp to get into home Has following equipment at home: None   Patient Goals: No falls, able to walk better     OBJECTIVE:   Patient Surveys  FOTO 43, predicted outcome score of 55   Cognition Patient is oriented to person, place, and time.  Recent memory is intact.  Remote memory is intact.  Attention span and concentration are intact.  Expressive speech is intact.  Patient's fund of knowledge is within normal limits for educational level.     Gross Musculoskeletal Assessment Tremor: None Bulk: Normal Tone: Normal No visible step-off along spinal column, no signs of scoliosis   GAIT: Pt demonstrates forward flexed posture, dec stance time on LLE, compensated Trendelenburg   Posture: Lumbar lordosis: WNL Elevated scapulae, increased thoracic kyphosis, dec lumbar lordosis Pt sits with weight shifted to R ischium     AROM  AROM (Normal range in degrees) AROM  02/17/2022  Lumbar   Flexion (65) 100% stiff with return to neural   Extension (30) 75%  Right lateral flexion (25) 50% pulls on L  hip*  Left lateral flexion (25) 25%*  Right rotation (30) 25%*  Left rotation (30) 75%      Hip Right Left  Flexion (125) 90 90*  Extension (15)    Abduction (40)    Adduction  35 30*  Internal Rotation (45) 45 30  External Rotation (45) 30 30      (* = pain; Blank rows = not tested)   LE MMT:  MMT (out of 5) Right 02/17/2022 Left 02/17/2022  Hip flexion 4 4-*  Hip extension    Hip abduction (seated) 4 4  Hip adduction 4 4  Hip internal rotation    Hip external rotation    Knee flexion 4 4-*  Knee extension 4+ 4  Ankle dorsiflexion 5 4  Ankle plantarflexion    Ankle inversion    Ankle eversion    (* = pain; Blank rows = not tested)   Sensation Grossly intact to light touch bilateral LEs as determined by testing dermatomes L2-S2. Proprioception, and hot/cold testing deferred on this date.   Muscle Length Hamstrings: R: Negative L: Lacking 20 deg extension (onset of L iliolumbar and L hip pain*)   Palpation  Location LEFT  RIGHT           Lumbar paraspinals  1  Quadratus Lumborum    Gluteus Maximus  2  Gluteus Medius  2  Deep hip external rotators  2  PSIS  2  Fortin's Area (SIJ)    Greater Trochanter  2  Hip flexors  2  (Blank rows = not tested) Graded on 0-4 scale (0 = no pain, 1 = pain, 2 = pain with wincing/grimacing/flinching, 3 = pain with withdrawal, 4 = unwilling to allow palpation), (Blank rows = not tested)     SPECIAL TESTS Lumbar Radiculopathy and Discogenic: Centralization and Peripheralization (SN 92, -LR 0.12): Not examined Slump (SN 83, -LR 0.32): R: Negative L: Positive for lateral hip pain  SLR (SN 92, -LR 0.29): R: Negative L:  Positive Crossed SLR (SP 90): R: Negative L: Negative Lumbar general traction in hooklying: Negative for pain  relief   Hip: FABER (SN 81): R: Not examined L: Positive FADIR (SN 94): R: Not examined L: Not examined Hip scour (SN 50): R: Not examined L: Positive  SIJ:  Thigh Thrust (SN 88, -LR 0.18) : R: Not examined L: Positive      TODAY'S TREATMENT : 02/17/22  SUBJECTIVE: Pt reports tolerating home exercises better after modification discussed. Patient reports taking extra Tylenol and this has helped (500 mg extra strength). Patient reports ongoing hip pain at about the same intensity. Patient reports 4-5/10 pain at arrival to PT.    MHP (unbilled) utilized prior to manual therapy for analgesic effect and improved soft tissue extensibility; along L hip in R sidelying (pillow in between knees to improve positional tolerance); x 5 minutes   Manual Therapy: 20 minutes total R sidelying with pillow between legs:   STM and IASTM with [employer] along L gluteus medius/maximus x 15 minutes Supine:   Attempted use of Mulligan belt for lateral distraction and for MWM, pt does not toleate pull of belt laterally or inferolaterally.   L hip long-leg distraction in loose-packed position, increased degree of hip flexion to improve pt tolerance, 10 sec on, 5 sec off; x 5 minutes     There.ex:                  Sidelying Clamshell; 2x10 - verbal cueing and demonstration  to limit trunk rotation       Supine Lower Trunk Rotation: 2x10/side    Supine Bridge : 2x10 with verbal/tactile cueing for technique    Standing minisquat; 1x15 - verbal cueing and demonstration for technique, careful CGA with PT's lower limb behind patient in case of loss of eccentric control        *not today* Hooklying adduction isometric for reciprocal inhibition and review for HEP; x10, 5 sec hold   Nu-Step L1 for 4 minutes for UE/LE use and gentle lumbar mobility.  Seated physioball roll outs for lumbar mobility: forwards and R/L lateral deviations x15/direction, 2-3 sec holds. Min VC's for decreasing speed of motion. L  sided, modified Half moon stretch for L side QL/lat/ and paraspinal stretch with arms at side: 4x30 sec   Heel raises: 2x12 Standing 3-way hip strengthening: 2x10/LE for all exercises. Min VC's for form/technique, neutral spine positioning.  Hook lying marches: 2x10    PATIENT EDUCATION:  Education details: form/technique with exercise. Person educated: Patient Education method: Explanation, Demonstration, and Handouts Education comprehension: verbalized understanding   HOME EXERCISE PROGRAM: Access Code: [email or web address] URL: [email or web address] Date: 01/27/2022 Prepared by: Valentina Gu  Exercises - Supine Lower Trunk Rotation  - 2 x daily - 7 x weekly - 2 sets - 10 reps - Supine Bridge  - 1 x daily - 7 x weekly - 2 sets - 10 reps - Seated Hip Adduction Isometrics with Ball  - 2 x daily - 7 x weekly - 2 sets - 10 reps - 5sec hold - Seated Hip Abduction with Resistance  - 2 x daily - 7 x weekly - 2 sets - 10 reps   ASSESSMENT:  CLINICAL IMPRESSION: Patient fortunately has improved tolerance of HEP performance. She has ongoing hip mobility deficits and continued lateral hip and groin pain. Patient has limited tolerance of Mulligan belt techniques today. She has fair response with long-leg distraction, but pain does resume with resumption of weightbearing/walking. Continued with low-impact gluteal isotonics and introduced light closed-chain loading today in limited ROM - pt is apprehensive with performance of minisquat without UE support initially; pt as guarded carefully from behind during this exercise. Pt will continue to benefit from skilled PT services to reduce LBP and L hip pain to return to PLOF.     REHAB POTENTIAL: Good  CLINICAL DECISION MAKING: Evolving/moderate complexity  EVALUATION COMPLEXITY: Moderate   GOALS:  SHORT TERM GOALS: Target date: 03/10/2022  Pt will be independent with HEP to improve strength and decrease back pain to improve pain-free  function at home and work. Baseline: 01/27/22: Baseline HEP initiated Goal status: INITIAL   LONG TERM GOALS: Target date: 04/14/2022  Pt will increase FOTO to at least 55 to demonstrate significant improvement in function at home and work related to back pain  Baseline: 01/27/22: 43 Goal status: INITIAL  2.  Pt will decrease worst hip pain by at least 3 points on the NPRS in order to demonstrate clinically significant reduction in hip pain. Baseline: 01/27/22: 10/10 at worst Goal status: INITIAL  3.  Pt will improve tested lower extremity manual muscle tests to at least 4+ or greater for all motions without reproduction of hip pain indicative of improved tolerance to loading and strength as needed for improved ability to perform transfers and loading-bearing tasks Baseline: 01/27/22: MMTs 4- to 4+ with exception of 5/5 R ankle dorsiflexion Goal status: INITIAL  4.  Patient will demonstrate normalized gait pattern with no significant compensated Trendelenburg or  lurch for home-mobility distance (150 ft or greater) indicative of improved ability to negotiate her home Baseline: 01/27/22: Pt demonstrates forward flexed posture, dec stance time on LLE, compensated Trendelenburg Goal status: INITIAL  5.  Patient will perform sit to stand with no UE support without reproduction of symptoms indicative of improved functional LE strength as needed for transferring and power production to prevent fall during episode of LOB Baseline: 01/27/22: heavy UE assist for sit to stand Goal status: INITIAL  PLAN: PT FREQUENCY: 2x/week  PT DURATION: 8 weeks  PLANNED INTERVENTIONS: Therapeutic exercises, Therapeutic activity, Neuromuscular re-education, Balance training, Gait training, Patient/Family education, Joint manipulation, Joint mobilization, Dry Needling, Electrical stimulation, Spinal mobilization, Cryotherapy, Moist heat, Traction, and Manual therapy  PLAN FOR NEXT SESSION: Manual techniques and  modalities prn to improve sensitivity of L hip and gluteal muscles. Review HEP. Continue with low-impact hip and general LE strengthening. Gradually progress with closed-chain drills as able.     Valentina Gu, PT, DPT #[id number]  Eilleen Kempf 02/17/2022, 2:05 PM

## 2022-02-18 ENCOUNTER — Encounter: Payer: Self-pay | Admitting: Ophthalmology

## 2022-02-18 ENCOUNTER — Encounter: Payer: Self-pay | Admitting: Physical Therapy

## 2022-02-19 ENCOUNTER — Ambulatory Visit: Payer: Medicare Other | Admitting: Physical Therapy

## 2022-02-19 NOTE — Discharge Instructions (Signed)

## 2022-02-20 NOTE — Anesthesia Preprocedure Evaluation (Addendum)
Anesthesia Evaluation  Patient identified by MRN, date of birth, ID band Patient awake    Reviewed: Allergy & Precautions, NPO status , Patient's Chart, lab work & pertinent test results  History of Anesthesia Complications (+) PONV and history of anesthetic complications  Airway Mallampati: I   Neck ROM: Full    Dental  (+) Upper Dentures, Missing Crowns :   Pulmonary sleep apnea ,    Pulmonary exam normal breath sounds clear to auscultation       Cardiovascular hypertension, Normal cardiovascular exam Rhythm:Regular Rate:Normal  Hx DVT, remote   Neuro/Psych Vertigo   Neuromuscular disease (diabetic polyneuropathy)    GI/Hepatic negative GI ROS,   Endo/Other  diabetes, Type 2, Insulin DependentClass 3 obesity  Renal/GU Renal disease (stage III CKD, nephrolithiasis)     Musculoskeletal   Abdominal   Peds  Hematology negative hematology ROS (+)   Anesthesia Other Findings   Reproductive/Obstetrics                            Anesthesia Physical Anesthesia Plan  ASA: 3  Anesthesia Plan: MAC   Post-op Pain Management:    Induction: Intravenous  PONV Risk Score and Plan: 3 and TIVA, Treatment may vary due to age or medical condition and Midazolam  Airway Management Planned: Natural Airway and Nasal Cannula  Additional Equipment:   Intra-op Plan:   Post-operative Plan:   Informed Consent: I have reviewed the patients History and Physical, chart, labs and discussed the procedure including the risks, benefits and alternatives for the proposed anesthesia with the patient or authorized representative who has indicated his/her understanding and acceptance.     Dental advisory given  Plan Discussed with: CRNA  Anesthesia Plan Comments: (LMA/GETA backup discussed.  Patient consented for risks of anesthesia including but not limited to:  - adverse reactions to medications - damage  to eyes, teeth, lips or other oral mucosa - nerve damage due to positioning  - sore throat or hoarseness - damage to heart, brain, nerves, lungs, other parts of body or loss of life  Informed patient about role of CRNA in peri- and intra-operative care.  Patient voiced understanding.)        Anesthesia Quick Evaluation

## 2022-02-24 ENCOUNTER — Encounter
Admission: RE | Disposition: A | Discharge status: Home / Self Care | Payer: Self-pay | Source: Home / Self Care | Attending: Ophthalmology

## 2022-02-24 ENCOUNTER — Other Ambulatory Visit: Payer: Self-pay

## 2022-02-24 ENCOUNTER — Ambulatory Visit
Admission: RE | Admit: 2022-02-24 | Discharge: 2022-02-24 | Disposition: A | Discharge status: Home / Self Care | Payer: Medicare Other | Attending: Ophthalmology | Admitting: Ophthalmology

## 2022-02-24 ENCOUNTER — Encounter: Payer: Self-pay | Admitting: Ophthalmology

## 2022-02-24 ENCOUNTER — Ambulatory Visit: Payer: Medicare Other | Admitting: Anesthesiology

## 2022-02-24 ENCOUNTER — Encounter: Payer: Medicare Other | Admitting: Physical Therapy

## 2022-02-24 DIAGNOSIS — E1136 Type 2 diabetes mellitus with diabetic cataract: Secondary | ICD-10-CM | POA: Insufficient documentation

## 2022-02-24 DIAGNOSIS — I129 Hypertensive chronic kidney disease with stage 1 through stage 4 chronic kidney disease, or unspecified chronic kidney disease: Secondary | ICD-10-CM | POA: Diagnosis not present

## 2022-02-24 DIAGNOSIS — Z6841 Body Mass Index (BMI) 40.0 and over, adult: Secondary | ICD-10-CM | POA: Insufficient documentation

## 2022-02-24 DIAGNOSIS — N183 Chronic kidney disease, stage 3 unspecified: Secondary | ICD-10-CM | POA: Insufficient documentation

## 2022-02-24 DIAGNOSIS — Z794 Long term (current) use of insulin: Secondary | ICD-10-CM | POA: Insufficient documentation

## 2022-02-24 DIAGNOSIS — G4730 Sleep apnea, unspecified: Secondary | ICD-10-CM | POA: Insufficient documentation

## 2022-02-24 DIAGNOSIS — H2511 Age-related nuclear cataract, right eye: Secondary | ICD-10-CM | POA: Diagnosis not present

## 2022-02-24 DIAGNOSIS — Z8249 Family history of ischemic heart disease and other diseases of the circulatory system: Secondary | ICD-10-CM | POA: Diagnosis not present

## 2022-02-24 DIAGNOSIS — H401111 Primary open-angle glaucoma, right eye, mild stage: Secondary | ICD-10-CM | POA: Diagnosis not present

## 2022-02-24 DIAGNOSIS — E669 Obesity, unspecified: Secondary | ICD-10-CM

## 2022-02-24 DIAGNOSIS — E1142 Type 2 diabetes mellitus with diabetic polyneuropathy: Secondary | ICD-10-CM | POA: Insufficient documentation

## 2022-02-24 DIAGNOSIS — E1122 Type 2 diabetes mellitus with diabetic chronic kidney disease: Secondary | ICD-10-CM | POA: Diagnosis not present

## 2022-02-24 HISTORY — DX: Other specified postprocedural states: Z98.890

## 2022-02-24 HISTORY — DX: Dizziness and giddiness: R42

## 2022-02-24 HISTORY — DX: Sleep apnea, unspecified: G47.30

## 2022-02-24 HISTORY — DX: Chronic kidney disease, stage 3 unspecified: N18.30

## 2022-02-24 HISTORY — DX: Personal history of urinary calculi: Z87.442

## 2022-02-24 HISTORY — PX: CATARACT EXTRACTION W/PHACO: SHX586

## 2022-02-24 HISTORY — DX: Presence of dental prosthetic device (complete) (partial): Z97.2

## 2022-02-24 LAB — GLUCOSE, CAPILLARY
Glucose-Capillary: 117 mg/dL — ABNORMAL HIGH (ref 70–99)
Glucose-Capillary: 134 mg/dL — ABNORMAL HIGH (ref 70–99)

## 2022-02-24 SURGERY — PHACOEMULSIFICATION, CATARACT, WITH IOL INSERTION
Anesthesia: Monitor Anesthesia Care | Site: Eye | Laterality: Right

## 2022-02-24 MED ORDER — MIDAZOLAM HCL 2 MG/2ML IJ SOLN
INTRAMUSCULAR | Status: DC | PRN
  Administered 2022-02-24 (×2): 1 mg via INTRAVENOUS

## 2022-02-24 MED ORDER — MOXIFLOXACIN HCL 0.5 % OP SOLN
OPHTHALMIC | Status: DC | PRN
  Administered 2022-02-24: 0.2 mL via OPHTHALMIC

## 2022-02-24 MED ORDER — FENTANYL CITRATE (PF) 100 MCG/2ML IJ SOLN
INTRAMUSCULAR | Status: DC | PRN
  Administered 2022-02-24 (×2): 50 ug via INTRAVENOUS

## 2022-02-24 MED ORDER — TETRACAINE HCL 0.5 % OP SOLN
1.0000 [drp] | OPHTHALMIC | Status: DC | PRN
  Administered 2022-02-24 (×3): 1 [drp] via OPHTHALMIC

## 2022-02-24 MED ORDER — SIGHTPATH DOSE#1 NA HYALUR & NA CHOND-NA HYALUR IO KIT
PACK | INTRAOCULAR | Status: DC | PRN
  Administered 2022-02-24: 1 via OPHTHALMIC

## 2022-02-24 MED ORDER — ARMC OPHTHALMIC DILATING DROPS
1.0000 | OPHTHALMIC | Status: DC | PRN
  Administered 2022-02-24 (×3): 1 via OPHTHALMIC

## 2022-02-24 MED ORDER — SIGHTPATH DOSE#1 BSS IO SOLN
INTRAOCULAR | Status: DC | PRN
  Administered 2022-02-24: 15 mL

## 2022-02-24 MED ORDER — SIGHTPATH DOSE#1 BSS IO SOLN
INTRAOCULAR | Status: DC | PRN
  Administered 2022-02-24: 103 mL via OPHTHALMIC

## 2022-02-24 MED ORDER — LIDOCAINE HCL (PF) 2 % IJ SOLN
INTRAOCULAR | Status: DC | PRN
  Administered 2022-02-24: 1 mL via INTRAOCULAR

## 2022-02-24 SURGICAL SUPPLY — 14 items
CATARACT SUITE SIGHTPATH (MISCELLANEOUS) ×1 IMPLANT
DISSECTOR HYDRO NUCLEUS 50X22 (MISCELLANEOUS) ×1 IMPLANT
FEE CATARACT SUITE SIGHTPATH (MISCELLANEOUS) ×1 IMPLANT
GLOVE SURG GAMMEX PI TX LF 7.5 (GLOVE) ×1 IMPLANT
GLOVE SURG SYN 8.5  E (GLOVE) ×1
GLOVE SURG SYN 8.5 E (GLOVE) ×1 IMPLANT
GLOVE SURG SYN 8.5 PF PI (GLOVE) ×1 IMPLANT
LENS IOL TECNIS EYHANCE 20.0 (Intraocular Lens) IMPLANT
NDL FILTER BLUNT 18X1 1/2 (NEEDLE) ×1 IMPLANT
NEEDLE FILTER BLUNT 18X1 1/2 (NEEDLE) ×1 IMPLANT
STENT OPTH STRL GLAUCOMA IMPLANT
SYR 3ML LL SCALE MARK (SYRINGE) ×1 IMPLANT
SYR 5ML LL (SYRINGE) ×1 IMPLANT
WATER STERILE IRR 250ML POUR (IV SOLUTION) ×1 IMPLANT

## 2022-02-24 NOTE — H&P (Signed)
[facility]   Primary Care Physician:  Nelda Marseille, MD Ophthalmologist: Dr. Benay Pillow  Pre-Procedure History & Physical: HPI:  [name] is a 77 y.o. female here for cataract surgery.   Past Medical History:  Diagnosis Date   CKD (chronic kidney disease), stage III ([place])    3b   Diabetes mellitus    DVT (deep venous thrombosis) ([place])    "In my 20's, after abdominal surgery"   Foot fracture, right    History of back surgery 1980   X2   History of colon polyps    History of kidney stones    Hyperlipidemia    Osteoporosis    Pneumonia    PONV (postoperative nausea and vomiting)    Sleep apnea    no CPAP   Vertigo    2 "bad" episodes.  most recent over 5 years ago   Vitamin D deficiency    Wears dentures    full upper    Past Surgical History:  Procedure Laterality Date   ABDOMINAL HYSTERECTOMY     total   [facility]    CARDIOVASCULAR STRESS TEST     abnormal, had cath which was normal   CARPAL TUNNEL RELEASE     CHOLECYSTECTOMY     COLECTOMY     for benign polyps   GLAUCOMA SURGERY     [facility]   KIDNEY STONE SURGERY     x3.  [facility]    Prior to Admission medications   Medication Sig Start Date End Date Taking? Authorizing Provider  acetaminophen (TYLENOL) 500 MG tablet Take 1,000 mg by mouth every 6 (six) hours as needed.   Yes [provider]  ALPHA LIPOIC ACID PO Take by mouth in the morning and at bedtime.   Yes [provider]  amLODipine (NORVASC) 5 MG tablet Take 5 mg by mouth daily. Take with 2.5 mg for total of 7.5 mg   Yes [provider]  citalopram (CELEXA) 10 MG tablet Take 10 mg by mouth daily.   Yes [provider]  Insulin Detemir (LEVEMIR FLEXTOUCH) 100 UNIT/ML Pen Inject 60 Units into the skin 2 (two) times daily. Patient taking differently: Inject 44 Units into the skin 2 (two) times daily. 11/29/15  Yes Jackolyn Confer, MD   latanoprost (XALATAN) 0.005 % ophthalmic solution Place 1 drop into both eyes at bedtime.   Yes [provider]  MECLIZINE HCL PO Take by mouth.   Yes [provider]  metoprolol succinate (TOPROL-XL) 25 MG 24 hr tablet Take 25 mg by mouth daily.   Yes [provider]  mirabegron ER (MYRBETRIQ) 50 MG TB24 tablet Take 50 mg by mouth daily.   Yes [provider]  nitroGLYCERIN (NITROSTAT) 0.4 MG SL tablet Place 1 tablet (0.4 mg total) under the tongue every 5 (five) minutes as needed for chest pain. 04/09/12  Yes Jackolyn Confer, MD  Potassium Citrate 15 MEQ (1620 MG) TBCR Take by mouth 2 (two) times daily.   Yes [provider]  rosuvastatin (CRESTOR) 10 MG tablet Take 10 mg by mouth daily.   Yes [provider]  timolol (BETIMOL) 0.5 % ophthalmic solution Place 1 drop into both eyes 2 (two) times daily.   Yes [provider]  Turmeric 500 MG TABS Take by mouth at bedtime.   Yes [provider]  amLODipine (NORVASC) 2.5 MG tablet Take 2.5 mg  by mouth daily. Take with 5 mg for total of 7.5 mg Patient not taking: Reported on 02/24/2022    [provider]  glucose blood (FREESTYLE LITE) test strip Check sugar 2-3 times daily Dx E10.69 02/07/15   Jackolyn Confer, MD    Allergies as of 01/21/2022 - Review Complete 08/19/2021  Allergen Reaction Noted   Tramadol Shortness Of Breath and Nausea And Vomiting 04/22/2011   Buprenex [buprenorphine]  02/18/2013   Iodinated contrast media  12/25/2010   Lisinopril  09/06/2012   Losartan  12/06/2012   Other Other (See Comments) 11/15/2015   Sulfa drugs cross reactors  12/25/2010   Ventolin [albuterol]  02/18/2013    Family History  Problem Relation Age of Onset   Heart attack Father 60       MI   Heart disease Father    Arrhythmia Sister        pacemaker   Breast cancer Sister    Breast cancer Paternal Aunt    Breast cancer Cousin 53       pat cousin     Social History   Socioeconomic History   Marital status: Married    Spouse name: Not on file   Number of children: Not on file   Years of education: Not on file   Highest education level: Not on file  Occupational History   Not on file  Tobacco Use   Smoking status: Never   Smokeless tobacco: Never  Vaping Use   Vaping Use: Never used  Substance and Sexual Activity   Alcohol use: No   Drug use: No   Sexual activity: Not on file  Other Topics Concern   Not on file  Social History Narrative   Not on file   Social Determinants of Health   Financial Resource Strain: Not on file  Food Insecurity: Not on file  Transportation Needs: Not on file  Physical Activity: Not on file  Stress: Not on file  Social Connections: Not on file  Intimate Partner Violence: Not on file    Review of Systems: See HPI, otherwise negative ROS  Physical Exam: BP (!) 184/68   Temp (!) 97.3 F (36.3 C) (Tympanic)   Ht 5' 2.01" (1.575 m)   Wt 101.2 kg   SpO2 97%   BMI 40.79 kg/m  General:   Alert, cooperative in NAD Head:  Normocephalic and atraumatic. Respiratory:  Normal work of breathing. Cardiovascular:  RRR  Impression/Plan: [name] is here for cataract surgery.  Risks, benefits, limitations, and alternatives regarding cataract surgery have been reviewed with the patient.  Questions have been answered.  All parties agreeable.   Benay Pillow, MD  02/24/2022, 7:50 AM

## 2022-02-24 NOTE — Transfer of Care (Signed)
Immediate Anesthesia Transfer of Care Note  Patient: [name]  Procedure(s) Performed: CATARACT EXTRACTION PHACO AND INTRAOCULAR LENS PLACEMENT (IOC) RIGHT DIABETIC HYDRUS MICROSTENT (Right: Eye)  Patient Location: PACU  Anesthesia Type: MAC  Level of Consciousness: awake, alert  and patient cooperative  Airway and Oxygen Therapy: Patient Spontanous Breathing and Patient connected to supplemental oxygen  Post-op Assessment: Post-op Vital signs reviewed, Patient's Cardiovascular Status Stable, Respiratory Function Stable, Patent Airway and No signs of Nausea or vomiting  Post-op Vital Signs: Reviewed and stable  Complications: No notable events documented.

## 2022-02-24 NOTE — Op Note (Signed)
OPERATIVE NOTE  [name] [id number] 02/24/2022  PREOPERATIVE DIAGNOSIS:   1.  Mild PRIMARY open angle glaucoma, right eye. H40.1111 2.  Nuclear sclerotic cataract right eye.  H25.11   POSTOPERATIVE DIAGNOSIS:    same.   PROCEDURE:   1.  Placement of trabecular bypass stent (hydrus) and phacoemusification with posterior chamber intraocular lens placement of the right eye  CPT [phone]   LENS: Implant Name Type Inv. Item Serial No. Manufacturer Lot No. LRB No. Used Action  LENS IOL TECNIS EYHANCE 20.0 - [id number] Intraocular Lens LENS IOL TECNIS EYHANCE 20.0 [id number] [employer]  Right 1 Implanted      Procedure(s) with comments: CATARACT EXTRACTION PHACO AND INTRAOCULAR LENS PLACEMENT (IOC) RIGHT DIABETIC HYDRUS MICROSTENT (Right) - Diabetic 4.18 00:44.9  DIB00 +20.0   ULTRASOUND TIME: 0 minutes 44 seconds.  CDE 4.18   SURGEON:  Benay Pillow, MD, MPH  ANESTHESIOLOGIST: Anesthesiologist: Darrin Nipper, MD CRNA: Moises Blood, CRNA   ANESTHESIA:  MAC and intracameral preservative-free lidocaine 4%.  ESTIMATED BLOOD LOSS: less than 1 mL.   COMPLICATIONS:  None.   DESCRIPTION OF PROCEDURE:  The patient was identified in the holding room and transported to the operating room.   The patient was placed in the supine position under the operating microscope.  The right eye was prepped and draped in the usual sterile ophthalmic fashion.   A 1.0 millimeter clear-corneal paracentesis was made at the 10:30 position and a second paracentesis at 7:00.  0.5 ml of preservative-free 1% lidocaine with epinephrine was injected into the anterior chamber.  The anterior chamber was filled with viscoelastic.  A 2.4 millimeter keratome was used to make a near-clear corneal incision at the 8:00 position.   Attention was turned to the hydrus.  The patients head was turned to the left and the microscope was tilted to 035 degrees.  Ocular instruments/Glaukos OAL/H2 gonioprism was used  coupled with viscoelastic on the cornea was used to visualize the trabecular meshwork. The hydrus was opened and introduced into the eye.  The meshwork was engaged with the tip of the injector and the hydrus stent was deployed into Schlemm's canal at 4:00.  The stent was well seated and in good position.  Next, attention was turned to the phacoemulsification A curvilinear capsulorrhexis was made with a cystotome and capsulorrhexis forceps.  Balanced salt solution was used to hydrodissect and hydrodelineate the nucleus.   Phacoemulsification was then used in stop and chop fashion to remove the lens nucleus and epinucleus.  The remaining cortex was then removed using the irrigation and aspiration handpiece. Viscoelastic was then placed into the capsular bag to distend it for lens placement.  A lens was then injected into the capsular bag.  The remaining viscoelastic was aspirated.   Wounds were hydrated with balanced salt solution.  The anterior chamber was inflated to a physiologic pressure with balanced salt solution.   Intracameral vigamox 0.1 mL undiluted was injected into the eye and a drop placed onto the ocular surface.  No wound leaks were noted. The patient was taken to the recovery room in stable condition without complications of anesthesia or surgery   Benay Pillow 02/24/2022, 8:30 AM

## 2022-02-24 NOTE — Anesthesia Postprocedure Evaluation (Signed)
Anesthesia Post Note  Patient: Aprile Dickenson  Procedure(s) Performed: CATARACT EXTRACTION PHACO AND INTRAOCULAR LENS PLACEMENT (IOC) RIGHT DIABETIC HYDRUS MICROSTENT (Right: Eye)  Patient location during evaluation: PACU Anesthesia Type: MAC Level of consciousness: awake and alert, oriented and patient cooperative Pain management: pain level controlled Vital Signs Assessment: post-procedure vital signs reviewed and stable Respiratory status: spontaneous breathing, nonlabored ventilation and respiratory function stable Cardiovascular status: blood pressure returned to baseline and stable Postop Assessment: adequate PO intake Anesthetic complications: no   No notable events documented.   Last Vitals:  Vitals:   02/24/22 0831 02/24/22 0838  BP: (!) 145/58 (!) 135/58  Pulse: (!) 57 (!) 54  Resp: 18 15  Temp: 36.7 C 36.6 C  SpO2: 95% 95%    Last Pain:  Vitals:   02/24/22 0838  TempSrc:   PainSc: 0-No pain                 Darrin Nipper

## 2022-02-25 ENCOUNTER — Encounter: Payer: Self-pay | Admitting: Ophthalmology

## 2022-02-25 ENCOUNTER — Other Ambulatory Visit: Payer: Self-pay

## 2022-02-26 ENCOUNTER — Encounter: Payer: Medicare Other | Admitting: Physical Therapy

## 2022-03-03 ENCOUNTER — Encounter: Payer: Medicare Other | Admitting: Physical Therapy

## 2022-03-05 ENCOUNTER — Encounter: Payer: Medicare Other | Admitting: Physical Therapy

## 2022-03-07 NOTE — Discharge Instructions (Signed)

## 2022-03-10 ENCOUNTER — Encounter: Payer: Self-pay | Admitting: Ophthalmology

## 2022-03-10 ENCOUNTER — Other Ambulatory Visit: Payer: Self-pay

## 2022-03-10 ENCOUNTER — Ambulatory Visit
Admission: RE | Admit: 2022-03-10 | Discharge: 2022-03-10 | Disposition: A | Discharge status: Home / Self Care | Payer: Medicare Other | Attending: Ophthalmology | Admitting: Ophthalmology

## 2022-03-10 ENCOUNTER — Ambulatory Visit: Payer: Medicare Other | Admitting: Anesthesiology

## 2022-03-10 ENCOUNTER — Encounter
Admission: RE | Disposition: A | Discharge status: Home / Self Care | Payer: Self-pay | Source: Home / Self Care | Attending: Ophthalmology

## 2022-03-10 DIAGNOSIS — H2512 Age-related nuclear cataract, left eye: Secondary | ICD-10-CM | POA: Diagnosis not present

## 2022-03-10 DIAGNOSIS — G4730 Sleep apnea, unspecified: Secondary | ICD-10-CM | POA: Diagnosis not present

## 2022-03-10 DIAGNOSIS — Z6841 Body Mass Index (BMI) 40.0 and over, adult: Secondary | ICD-10-CM | POA: Insufficient documentation

## 2022-03-10 DIAGNOSIS — Z794 Long term (current) use of insulin: Secondary | ICD-10-CM | POA: Diagnosis not present

## 2022-03-10 DIAGNOSIS — E1122 Type 2 diabetes mellitus with diabetic chronic kidney disease: Secondary | ICD-10-CM | POA: Insufficient documentation

## 2022-03-10 DIAGNOSIS — E669 Obesity, unspecified: Secondary | ICD-10-CM

## 2022-03-10 DIAGNOSIS — N183 Chronic kidney disease, stage 3 unspecified: Secondary | ICD-10-CM | POA: Diagnosis not present

## 2022-03-10 DIAGNOSIS — H401121 Primary open-angle glaucoma, left eye, mild stage: Secondary | ICD-10-CM | POA: Diagnosis present

## 2022-03-10 DIAGNOSIS — E1136 Type 2 diabetes mellitus with diabetic cataract: Secondary | ICD-10-CM | POA: Diagnosis not present

## 2022-03-10 DIAGNOSIS — E1140 Type 2 diabetes mellitus with diabetic neuropathy, unspecified: Secondary | ICD-10-CM | POA: Insufficient documentation

## 2022-03-10 DIAGNOSIS — I129 Hypertensive chronic kidney disease with stage 1 through stage 4 chronic kidney disease, or unspecified chronic kidney disease: Secondary | ICD-10-CM | POA: Diagnosis not present

## 2022-03-10 HISTORY — PX: CATARACT EXTRACTION W/PHACO: SHX586

## 2022-03-10 LAB — GLUCOSE, CAPILLARY: Glucose-Capillary: 191 mg/dL — ABNORMAL HIGH (ref 70–99)

## 2022-03-10 SURGERY — PHACOEMULSIFICATION, CATARACT, WITH IOL INSERTION
Anesthesia: Monitor Anesthesia Care | Site: Eye | Laterality: Left

## 2022-03-10 MED ORDER — ARMC OPHTHALMIC DILATING DROPS
1.0000 | OPHTHALMIC | Status: DC | PRN
  Administered 2022-03-10 (×3): 1 via OPHTHALMIC

## 2022-03-10 MED ORDER — FENTANYL CITRATE (PF) 100 MCG/2ML IJ SOLN
INTRAMUSCULAR | Status: DC | PRN
  Administered 2022-03-10 (×2): 50 ug via INTRAVENOUS

## 2022-03-10 MED ORDER — LIDOCAINE HCL (PF) 2 % IJ SOLN
INTRAOCULAR | Status: DC | PRN
  Administered 2022-03-10: 1 mL via INTRAOCULAR

## 2022-03-10 MED ORDER — SIGHTPATH DOSE#1 BSS IO SOLN
INTRAOCULAR | Status: DC | PRN
  Administered 2022-03-10: 15 mL

## 2022-03-10 MED ORDER — TETRACAINE HCL 0.5 % OP SOLN
1.0000 [drp] | OPHTHALMIC | Status: DC | PRN
  Administered 2022-03-10 (×3): 1 [drp] via OPHTHALMIC

## 2022-03-10 MED ORDER — MIDAZOLAM HCL 2 MG/2ML IJ SOLN
INTRAMUSCULAR | Status: DC | PRN
  Administered 2022-03-10: 2 mg via INTRAVENOUS

## 2022-03-10 MED ORDER — SIGHTPATH DOSE#1 NA HYALUR & NA CHOND-NA HYALUR IO KIT
PACK | INTRAOCULAR | Status: DC | PRN
  Administered 2022-03-10: 1 via OPHTHALMIC

## 2022-03-10 MED ORDER — SIGHTPATH DOSE#1 BSS IO SOLN
INTRAOCULAR | Status: DC | PRN
  Administered 2022-03-10: 96 mL via OPHTHALMIC

## 2022-03-10 MED ORDER — LACTATED RINGERS IV SOLN: INTRAVENOUS | Status: DC

## 2022-03-10 MED ORDER — MOXIFLOXACIN HCL 0.5 % OP SOLN
OPHTHALMIC | Status: DC | PRN
  Administered 2022-03-10: 0.2 mL via OPHTHALMIC

## 2022-03-10 SURGICAL SUPPLY — 14 items
CATARACT SUITE SIGHTPATH (MISCELLANEOUS) ×1 IMPLANT
DISSECTOR HYDRO NUCLEUS 50X22 (MISCELLANEOUS) ×1 IMPLANT
FEE CATARACT SUITE SIGHTPATH (MISCELLANEOUS) ×1 IMPLANT
GLOVE SURG GAMMEX PI TX LF 7.5 (GLOVE) ×1 IMPLANT
GLOVE SURG SYN 8.5  E (GLOVE) ×1
GLOVE SURG SYN 8.5 E (GLOVE) ×1 IMPLANT
GLOVE SURG SYN 8.5 PF PI (GLOVE) ×1 IMPLANT
LENS IOL TECNIS EYHANCE 20.5 (Intraocular Lens) IMPLANT
NDL FILTER BLUNT 18X1 1/2 (NEEDLE) ×1 IMPLANT
NEEDLE FILTER BLUNT 18X1 1/2 (NEEDLE) ×1 IMPLANT
STENT OPTH STRL GLAUCOMA IMPLANT
SYR 3ML LL SCALE MARK (SYRINGE) ×1 IMPLANT
SYR 5ML LL (SYRINGE) ×1 IMPLANT
WATER STERILE IRR 250ML POUR (IV SOLUTION) ×1 IMPLANT

## 2022-03-10 NOTE — Anesthesia Postprocedure Evaluation (Signed)
Anesthesia Post Note  Patient: [name]  Procedure(s) Performed: CATARACT EXTRACTION PHACO AND INTRAOCULAR LENS PLACEMENT (IOC) LEFT DIABETIC HYDRUS MICROSTENT 5.86 00:37.6 (Left: Eye)  Patient location during evaluation: PACU Anesthesia Type: MAC Level of consciousness: awake and alert Pain management: pain level controlled Vital Signs Assessment: post-procedure vital signs reviewed and stable Respiratory status: spontaneous breathing, nonlabored ventilation, respiratory function stable and patient connected to nasal cannula oxygen Cardiovascular status: stable and blood pressure returned to baseline Postop Assessment: no apparent nausea or vomiting Anesthetic complications: no   No notable events documented.   Last Vitals:  Vitals:   03/10/22 1109 03/10/22 1113  BP: (!) 149/73 (!) 162/57  Pulse: (!) 56 (!) 51  Resp: 18 20  Temp: (!) 36.4 C (!) 36.4 C  SpO2: 98% 97%    Last Pain:  Vitals:   03/10/22 1113  TempSrc:   PainSc: 0-No pain                 Martha Clan

## 2022-03-10 NOTE — H&P (Signed)
[facility]   Primary Care Physician:  Nelda Marseille, MD Ophthalmologist: Dr. Benay Pillow  Pre-Procedure History & Physical: HPI:  [name] is a 76 y.o. female here for cataract surgery.   Past Medical History:  Diagnosis Date   CKD (chronic kidney disease), stage III ([place])    3b   Diabetes mellitus    DVT (deep venous thrombosis) ([place])    "In my 89's, after abdominal surgery"   Foot fracture, right    History of back surgery 1980   X2   History of colon polyps    History of kidney stones    Hyperlipidemia    Osteoporosis    Pneumonia    PONV (postoperative nausea and vomiting)    Sleep apnea    no CPAP   Vertigo    2 "bad" episodes.  most recent over 5 years ago   Vitamin D deficiency    Wears dentures    full upper    Past Surgical History:  Procedure Laterality Date   ABDOMINAL HYSTERECTOMY     total   [facility]    CARDIOVASCULAR STRESS TEST     abnormal, had cath which was normal   CARPAL TUNNEL RELEASE     CATARACT EXTRACTION W/PHACO Right 02/24/2022   Procedure: CATARACT EXTRACTION PHACO AND INTRAOCULAR LENS PLACEMENT ([place]) RIGHT DIABETIC HYDRUS MICROSTENT;  Surgeon: Eulogio Bear, MD;  Location: [place];  Service: Ophthalmology;  Laterality: Right;  Diabetic 4.18 00:44.9   CHOLECYSTECTOMY     COLECTOMY     for benign polyps   GLAUCOMA SURGERY     [facility]   KIDNEY STONE SURGERY     x3.  [facility]    Prior to Admission medications   Medication Sig Start Date End Date Taking? Authorizing Provider  acetaminophen (TYLENOL) 500 MG tablet Take 1,000 mg by mouth every 6 (six) hours as needed.   Yes [provider]  ALPHA LIPOIC ACID PO Take by mouth in the morning and at bedtime.   Yes [provider]  amLODipine (NORVASC) 5 MG tablet Take 5 mg by mouth daily. Take with 2.5 mg for total of 7.5 mg   Yes [provider]  citalopram (CELEXA) 10  MG tablet Take 10 mg by mouth daily.   Yes [provider]  Insulin Detemir (LEVEMIR FLEXTOUCH) 100 UNIT/ML Pen Inject 60 Units into the skin 2 (two) times daily. Patient taking differently: Inject 44 Units into the skin 2 (two) times daily. 11/29/15  Yes Jackolyn Confer, MD  latanoprost (XALATAN) 0.005 % ophthalmic solution Place 1 drop into both eyes at bedtime.   Yes [provider]  MECLIZINE HCL PO Take by mouth.   Yes [provider]  metoprolol succinate (TOPROL-XL) 25 MG 24 hr tablet Take 25 mg by mouth daily.   Yes [provider]  mirabegron ER (MYRBETRIQ) 50 MG TB24 tablet Take 50 mg by mouth daily.   Yes [provider]  Potassium Citrate 15 MEQ (1620 MG) TBCR Take by mouth 2 (two) times daily.   Yes [provider]  rosuvastatin (CRESTOR) 10 MG tablet Take 10 mg by mouth daily.   Yes [provider]  timolol (BETIMOL) 0.5 % ophthalmic solution Place 1 drop into both eyes 2 (two) times daily.   Yes [provider]  Turmeric 500 MG TABS Take by mouth at bedtime.   Yes [provider]  amLODipine (NORVASC) 2.5 MG tablet Take 2.5 mg by mouth daily. Take with 5 mg for total of 7.5 mg Patient not taking: Reported on 02/24/2022    [provider]  glucose blood (FREESTYLE LITE) test strip Check sugar 2-3 times daily Dx E10.69 02/07/15   Jackolyn Confer, MD  nitroGLYCERIN (NITROSTAT) 0.4 MG SL tablet Place 1 tablet (0.4 mg total) under the tongue every 5 (five) minutes as needed for chest pain. 04/09/12   Jackolyn Confer, MD    Allergies as of 01/21/2022 - Review Complete 08/19/2021  Allergen Reaction Noted   Tramadol Shortness Of Breath and Nausea And Vomiting 04/22/2011   Buprenex [buprenorphine]  02/18/2013   Iodinated contrast media  12/25/2010   Lisinopril  09/06/2012   Losartan  12/06/2012   Other Other (See Comments) 11/15/2015   Sulfa drugs cross reactors  12/25/2010   Ventolin  [albuterol]  02/18/2013    Family History  Problem Relation Age of Onset   Heart attack Father 2       MI   Heart disease Father    Arrhythmia Sister        pacemaker   Breast cancer Sister    Breast cancer Paternal Aunt    Breast cancer Cousin 36       pat cousin    Social History   Socioeconomic History   Marital status: Married    Spouse name: Not on file   Number of children: Not on file   Years of education: Not on file   Highest education level: Not on file  Occupational History   Not on file  Tobacco Use   Smoking status: Never   Smokeless tobacco: Never  Vaping Use   Vaping Use: Never used  Substance and Sexual Activity   Alcohol use: No   Drug use: No   Sexual activity: Not on file  Other Topics Concern   Not on file  Social History Narrative   Not on file   Social Determinants of Health   Financial Resource Strain: Not on file  Food Insecurity: Not on file  Transportation Needs: Not on file  Physical Activity: Not on file  Stress: Not on file  Social Connections: Not on file  Intimate Partner Violence: Not on file    Review of Systems: See HPI, otherwise negative ROS  Physical Exam: BP (!) 183/73   Pulse (!) 59   Temp (!) 97.3 F (36.3 C) (Temporal)   Ht '5\' 2"'$  (1.575 m)   Wt 102 kg   SpO2 98%   BMI 41.13 kg/m  General:   Alert, cooperative in NAD Head:  Normocephalic and atraumatic. Respiratory:  Normal work of breathing. Cardiovascular:  RRR  Impression/Plan: [name] is here for cataract surgery.  Risks, benefits, limitations, and alternatives regarding cataract surgery have been reviewed with the patient.  Questions have been answered.  All parties agreeable.   Benay Pillow, MD  03/10/2022, 10:28 AM

## 2022-03-10 NOTE — Transfer of Care (Signed)
Immediate Anesthesia Transfer of Care Note  Patient: [name]  Procedure(s) Performed: CATARACT EXTRACTION PHACO AND INTRAOCULAR LENS PLACEMENT (IOC) LEFT DIABETIC HYDRUS MICROSTENT 5.86 00:37.6 (Left: Eye)  Patient Location: PACU  Anesthesia Type: MAC  Level of Consciousness: awake, alert  and patient cooperative  Airway and Oxygen Therapy: Patient Spontanous Breathing and Patient connected to supplemental oxygen  Post-op Assessment: Post-op Vital signs reviewed, Patient's Cardiovascular Status Stable, Respiratory Function Stable, Patent Airway and No signs of Nausea or vomiting  Post-op Vital Signs: Reviewed and stable  Complications: No notable events documented.

## 2022-03-10 NOTE — Op Note (Signed)
OPERATIVE NOTE  [name] [id number] 03/10/2022  PREOPERATIVE DIAGNOSIS:   1.  Mild  PRIMARY open angle glaucoma, left eye. F68.1275  2.  Nuclear sclerotic cataract left eye.  H25.12   POSTOPERATIVE DIAGNOSIS:    same.   PROCEDURE:   1.  Placement of trabecular bypass stent (hydrus) and phacoemusification with posterior chamber intraocular lens placement of the left eye  CPT [phone]   LENS: Implant Name Type Inv. Item Serial No. Manufacturer Lot No. LRB No. Used Action  STENT OPTH STRL GLAUCOMA - [id number]  STENT OPTH STRL GLAUCOMA [id number] [employer]  Left 1 Implanted  LENS IOL TECNIS EYHANCE 20.5 - [id number] Intraocular Lens LENS IOL TECNIS EYHANCE 20.5 [id number] [employer]  Left 1 Implanted      Procedure(s) with comments: CATARACT EXTRACTION PHACO AND INTRAOCULAR LENS PLACEMENT (IOC) LEFT DIABETIC HYDRUS MICROSTENT 5.86 00:37.6 (Left) - Diabetic  SURGEON:  Benay Pillow, MD, MPH  ANESTHESIOLOGIST: Anesthesiologist: Martha Clan, MD CRNA: Tobie Poet, CRNA   ANESTHESIA:  MAC  and intracameral preservative-free intracameral lidocaine 4%.  ESTIMATED BLOOD LOSS: less than 1 mL.   COMPLICATIONS:  None.   DESCRIPTION OF PROCEDURE:  The patient was identified in the holding room and transported to the operating room.  The patient was placed in the supine position under the operating microscope.  The left eye was prepped and draped in the usual sterile ophthalmic fashion.   A 1.0 millimeter clear-corneal paracentesis was made at the 4:30 position, and a second paracentesis was made at 1:30 for the hydrus. 0.5 ml of preservative-free 1% lidocaine with epinephrine was injected into the anterior chamber.  The anterior chamber was filled with Healon 5 viscoelastic.  A 2.4 millimeter keratome was used to make a near-clear corneal incision at the 2:00 position.   Attention was turned to the hydrus stent.  The patients head was turned to the left and the microscope was  tilted to 035 degrees.  Ocular instruments/Glaukos OAL/H2 gonioprism was used with Healon 5 on the cornea to visualize the trabecular meshwork. The hydrus was introduced into the eye and the  meshwork was engaged with the tip of the and the stent was deployed into Schlemm's canal.  The stent was well seated and in good position.  Next, attention was turned to the phacoemulsification A curvilinear capsulorrhexis was made with a cystotome and capsulorrhexis forceps.  Balanced salt solution was used to hydrodissect and hydrodelineate the nucleus.   Phacoemulsification was then used in stop and chop fashion to remove the lens nucleus and epinucleus.  The remaining cortex was then removed using the irrigation and aspiration handpiece. Healon was then placed into the capsular bag to distend it for lens placement.  A lens was then injected into the capsular bag.  The remaining viscoelastic was aspirated.   Wounds were hydrated with balanced salt solution.  The anterior chamber was inflated to a physiologic pressure with balanced salt solution.   Intracameral vigamox 0.1 mL undiluted was injected into the eye and a drop placed onto the ocular surface.  No wound leaks were noted.  Protective glasses were placed on the patient.  The patient was taken to the recovery room in stable condition without complications of anesthesia or surgery   Benay Pillow 03/10/2022, 11:08 AM

## 2022-03-10 NOTE — Anesthesia Preprocedure Evaluation (Signed)
Anesthesia Evaluation  Patient identified by MRN, date of birth, ID band Patient awake    Reviewed: Allergy & Precautions, NPO status , Patient's Chart, lab work & pertinent test results  History of Anesthesia Complications (+) PONV and history of anesthetic complications  Airway Mallampati: I   Neck ROM: Full    Dental  (+) Upper Dentures, Missing Crowns :   Pulmonary sleep apnea ,    Pulmonary exam normal breath sounds clear to auscultation       Cardiovascular hypertension, Normal cardiovascular exam Rhythm:Regular Rate:Normal  Hx DVT, remote   Neuro/Psych Vertigo   Neuromuscular disease (diabetic polyneuropathy)    GI/Hepatic negative GI ROS,   Endo/Other  diabetes, Type 2, Insulin DependentClass 3 obesity  Renal/GU Renal disease (stage III CKD, nephrolithiasis)     Musculoskeletal   Abdominal   Peds  Hematology negative hematology ROS (+)   Anesthesia Other Findings   Reproductive/Obstetrics                             Anesthesia Physical  Anesthesia Plan  ASA: 3  Anesthesia Plan: MAC   Post-op Pain Management:    Induction: Intravenous  PONV Risk Score and Plan: 3 and TIVA, Treatment may vary due to age or medical condition and Midazolam  Airway Management Planned: Natural Airway and Nasal Cannula  Additional Equipment:   Intra-op Plan:   Post-operative Plan:   Informed Consent: I have reviewed the patients History and Physical, chart, labs and discussed the procedure including the risks, benefits and alternatives for the proposed anesthesia with the patient or authorized representative who has indicated his/her understanding and acceptance.     Dental advisory given  Plan Discussed with: CRNA  Anesthesia Plan Comments: (LMA/GETA backup discussed.  Patient consented for risks of anesthesia including but not limited to:  - adverse reactions to medications -  damage to eyes, teeth, lips or other oral mucosa - nerve damage due to positioning  - sore throat or hoarseness - damage to heart, brain, nerves, lungs, other parts of body or loss of life  Informed patient about role of CRNA in peri- and intra-operative care.  Patient voiced understanding.)        Anesthesia Quick Evaluation

## 2022-03-11 ENCOUNTER — Encounter: Payer: Self-pay | Admitting: Ophthalmology

## 2022-08-01 ENCOUNTER — Encounter: Payer: Self-pay | Admitting: Physical Therapy

## 2022-08-01 ENCOUNTER — Ambulatory Visit: Payer: Medicare Other | Attending: Internal Medicine | Admitting: Physical Therapy

## 2022-08-01 DIAGNOSIS — R262 Difficulty in walking, not elsewhere classified: Secondary | ICD-10-CM | POA: Diagnosis present

## 2022-08-01 DIAGNOSIS — R278 Other lack of coordination: Secondary | ICD-10-CM | POA: Diagnosis present

## 2022-08-01 DIAGNOSIS — M6281 Muscle weakness (generalized): Secondary | ICD-10-CM

## 2022-08-01 DIAGNOSIS — M25552 Pain in left hip: Secondary | ICD-10-CM

## 2022-08-01 NOTE — Therapy (Signed)
OUTPATIENT PHYSICAL THERAPY LOWER EXTREMITY EVALUATION   Patient Name: [name] MRN: [id number] DOB:[birth year], 77 y.o., female Today's Date: 08/01/2022  END OF SESSION:  PT End of Session - 08/01/22 1255     Visit Number 1    Number of Visits 12    Date for PT Re-Evaluation 09/12/22    Authorization Type --    PT Start Time 1247    PT Stop Time 1340    PT Time Calculation (min) 53 min    Activity Tolerance Patient tolerated treatment well    Behavior During Therapy WFL for tasks assessed/performed             Past Medical History:  Diagnosis Date   CKD (chronic kidney disease), stage III ([place])    3b   Diabetes mellitus    DVT (deep venous thrombosis) ([place])    "In my 50's, after abdominal surgery"   Foot fracture, right    History of back surgery 1980   X2   History of colon polyps    History of kidney stones    Hyperlipidemia    Osteoporosis    Pneumonia    PONV (postoperative nausea and vomiting)    Sleep apnea    no CPAP   Vertigo    2 "bad" episodes.  most recent over 5 years ago   Vitamin D deficiency    Wears dentures    full upper   Past Surgical History:  Procedure Laterality Date   ABDOMINAL HYSTERECTOMY     total   [facility]    CARDIOVASCULAR STRESS TEST     abnormal, had cath which was normal   CARPAL TUNNEL RELEASE     CATARACT EXTRACTION W/PHACO Right 02/24/2022   Procedure: CATARACT EXTRACTION PHACO AND INTRAOCULAR LENS PLACEMENT ([place]) RIGHT DIABETIC HYDRUS MICROSTENT;  Surgeon: Eulogio Bear, MD;  Location: [facility];  Service: Ophthalmology;  Laterality: Right;  Diabetic 4.18 00:44.9   CATARACT EXTRACTION W/PHACO Left 03/10/2022   Procedure: CATARACT EXTRACTION PHACO AND INTRAOCULAR LENS PLACEMENT ([facility]) LEFT DIABETIC HYDRUS MICROSTENT 5.86 00:37.6;  Surgeon: Eulogio Bear, MD;  Location: [facility];  Service: Ophthalmology;  Laterality: Left;   Diabetic   CHOLECYSTECTOMY     COLECTOMY     for benign polyps   GLAUCOMA SURGERY     [facility]   KIDNEY STONE SURGERY     x3.  [facility]   Patient Active Problem List   Diagnosis Date Noted   Coronary artery disease due to lipid rich plaque 03/09/2015   DJD (degenerative joint disease), cervical 08/15/2014   Right shoulder pain 07/03/2014   Hypomagnesemia 03/13/2014   Nephrolithiasis 10/10/2013   Medicare annual wellness visit, subsequent 03/10/2013   Postmenopausal estrogen deficiency 03/10/2013   Hyperlipidemia 02/21/2013   Diabetes mellitus type 2 in obese ([place]) 12/06/2012   Right hip pain 09/06/2012   Morbid obesity ([place]) 01/09/2011   Osteopenia 12/25/2010   PCP: Nelda Marseille, MD  REFERRING PROVIDER: Nelda Marseille, MD  REFERRING DIAG: Unilateral primary osteoarthritis, left hip   THERAPY DIAG:  Pain in left hip  Muscle weakness (generalized)  Difficulty in walking, not elsewhere classified  Other lack of coordination  Rationale for Evaluation and Treatment: Rehabilitation  ONSET DATE: 12/2021 (hip pain)  SUBJECTIVE:   SUBJECTIVE STATEMENT: Pt. Is currently off all meds except insulin.  7-8/10 L hip/ groin pain at worst with walking.  Pt. Referred  to PT to increase generalized strengthening and improve overall mobility prior to upcoming surgery.  Limited with walking due to pain/ fatigue.  Pt. Able to complete grocery shopping but limited.  No use of assistive device and no falls.    PERTINENT HISTORY:  CANCER HISTORY Extrahepatic Cholangiocarcinoma: - 6 weeks of generalized pruritus - Labs notable for transaminitis and elevated alk phos - 07/20/22: MRCP with severe intra and extrahepatic biliary dilatation extending to the distal CBD - 07/24/22: EUS with normal appearing pancreas, ERCP with structure in the lower third of the main bile duct, bile duct biopsy showing poorly differentiated adenocarcinoma, stent  placed ____________________________________________________________________  ASSESSMENT 1. 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 Weight Loss 3. Nausea 4. Left Hip Pain limiting mobility  RECOMMENDATIONS 1. CT Chest (non contrast d/t allergy) 2. CEA, CA 19-9, CMP, CBC 3. Scheduled to see surgery 4. Anticipate next follow up with med onc after surgery to discuss adjuvant therapy 5. Start work with physical therapy, patient already has referral  DISCUSSION We reviewed her diagnostic work up thus far which shows a localized extrahepatic cholangiocarcinoma. Imaging does not show evidence of nodal disease. We will obtain CT chest to complete staging. As this appears to be a resectable cancer we would generally favor upfront surgery most likely followed by adjuvant capecitabine. She has had significant physical decline recently and her mobility has been especially limited due to her left hip. She was evaluated by an orthopedic surgeon recently but now cancer diagnosis is interrupted this workup. Given the extensive surgery required for resection of her tumor we did discuss the possibility of neoadjuvant chemotherapy and prehabilitation if she is not deemed a current surgical candidate.  This patient was seen and discussed with Dr. Altamease Oiler.  Avelina Laine, MD Hematology / Oncology Fellow  ______________________________________________________________________ HISTORY  [name] is seen today at the [facility] for consultation regarding extrahepatic cholangiocarcinoma at the request of Dr. Quita Skye.  She initially presented with over a month of diffuse pruritus. This prompted evaluation in clinic with labs revealing transaminitis and elevated alkaline phosphatase. Subsequent imaging showed narrowing of common bile duct. ERCP with biopsy of distal bile duct revealed adenocarcinoma.  Over the  last few months she has really been limited by left hip pain. She was evaluated by an orthopedic surgeon with plan for a steroid injection but this has been delayed with new cancer diagnosis. She remains independent in ADLs and lives alone. She is now needing assistance with IADLs. For example when she goes grocery shopping she normally sits in the car while her children shop. She denies any chest pain. She does have dyspnea with exertion. No issues with constipation or diarrhea. She has been having some persistent mild nausea without vomiting. Her p.o. intake is also decreased recently. She denies any significant abdominal pain following biliary stent placement.  MEDICAL HISTORY 1. CAD (no prior PCI or surgery) 2. HTN 3. T2DM c/b polyneuropathy, on insulin, follows with endocrinology 4. CKD - G3bA3, follows with nephrology 5. Nephrolithiasis 6. HLD - on rosuvastatin 7. DVT 8. IgG Kappa MGUS  Allergies Allergen Reactions Albuterol Shortness Of Breath Iodine And Iodide Containing Products Hives Metrizamide Swelling Other Swelling Uncoded Allergy. Allergen: SHELLFISH, Other Reaction: OTHER REACTION Tramadol Nausea And Vomiting, Shortness Of Breath and Nausea Only Chest pain Sulfur Other (See Comments) Iodinated Contrast Media Ioxaglate Sodium Metoprolol Other (See Comments) SEVERE BRADYCARDIA IN 30'S UNKNOWN IF IT WAS A  TRUE HEART BLCOK Statins-Hmg-Coa Reductase Inhibitors Other (See Comments) Muscle aches and severe fatigue Sulfasalazine Buprenorphine Rash Losartan Nausea Only Nausea, lightheaded Sulfa (Sulfonamide Antibiotics) Rash and Other (See Comments) Trospium Itching  Medications reviewed in the EMR  FAMILY HISTORY Brother thyroid cancer Sister breast cancer  SOCIAL HISTORY Lives alone. Here with son and daughter. Son is a Marine scientist at [employer] surgical clinic. No tobacco, alcohol, or drug use.  REVIEW OF SYSTEMS The remainder of a comprehensive 10 systems review is  negative.  PHYSICAL EXAM BP 156/64  Pulse 66  Temp 36.7 C (98 F) (Temporal)  Resp 18  Ht 156.5 cm (5' 1.61")  Wt 97 kg (213 lb 12.8 oz)  SpO2 100%  BMI 39.60 kg/m GENERAL: well developed, well nourished, in no distress PSYCH: full and appropriate range of affect with good insight and judgement HEENT: NCAT, pupils equal, sclerae anicteric, OP clear without erythema, exudate or ulceration; neck supple without thyromegaly or masses; LYMPH: no cervical, supraclavicular, periumbilical adenopathy PULM: normal resp rate, clear bilaterally without wheezes, rhonchi, rales COR: regular without murmur, rub, gallop GI: abdomen soft, nontender, nondistended, no palpable hepatosplenomegaly EXT: warm and well perfused, no edema SKIN: No rashes  OBJECTIVE DATA LABS 07/16/22: transaminitis, normal bili, elevated alk phos and GGT, Cr 1.87, lipase 160 07/12/22: CBC without cytopenias  PATHOLOGY: reviewed  RADIOLOGY RESULTS (scans are personally reviewed by me and my impression) MRI Abd/MRCP 07/20/22: - biliary dilatation with obstruction in distal CBD, normal appearing liver, GB surgically absent, pancrease with multiple sub centimeter cystic lesions in the hear that 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 ATTENDING Based on imaging/ERCP this looks to be an extrahepatic cholangiocarcinoma wtihout nodal involvement. We discussed that even small tumors can spread so we do usually do adjuvant therapy. We do not have evidenec that neoadjuvant treatment offers much benefit vs adjuvant therapy, though is certainly something we consider in the setting of preoperative evidence of bulky or high risk nodal disease. I am a little concerned about her conditioning for surgery, so will  discuss with multid team after she had spoken with our surgical teams. I saw and evaluated the patient, participating in the key portions of the service. I reviewed the resident's note. I agree with the resident's findings and plan. I have personally reviewed the patient's medical records and imaging. Ulice Dash, MD   PAIN:  Are you having pain? Yes: NPRS scale: 4-5/10 Pain location: L hip Pain description: aching Aggravating factors: walking Relieving factors: resting/ "getting off the hip"  PRECAUTIONS: None  WEIGHT BEARING RESTRICTIONS: No  FALLS:  Has patient fallen in last 6 months? No  LIVING ENVIRONMENT: Lives with: lives alone  Pt. Has good family support that lives local.   Lives in: House/apartment Stairs: No Has following equipment at home: Ramped entry  OCCUPATION: Retired  PLOF: Independent   PATIENT GOALS: Increase muscle strength/ improve pain-free mobility and conditioning.  NEXT MD VISIT: 08/11/22 (meeting with surgeon)  OBJECTIVE:   DIAGNOSTIC FINDINGS: EXAM:  MR OF THE LEFT HIP WITHOUT CONTRAST   TECHNIQUE:  Multiplanar, multisequence MR imaging was performed. No intravenous  contrast was administered.   COMPARISON:  None Available.   FINDINGS:  Bones:   No hip fracture, dislocation or avascular necrosis.   No periosteal reaction or bone  destruction. No aggressive osseous  lesion.   Normal sacrum and sacroiliac joints. No SI joint widening or erosive  changes.   Degenerative disease with disc height loss and facet arthropathy at  L3-4, L4-5 and L5-S1.   Mild subchondral bone marrow edema on either side of the pubic  symphysis as can be seen with mild osteitis pubis.   Articular cartilage and labrum   Articular cartilage: Partial-thickness cartilage loss of the femoral  head and acetabulum bilaterally.   Labrum:  Left superior labral degeneration.   Joint or bursal effusion   Joint effusion:  No hip joint effusion.  No SI joint  effusion.   Bursae:  No bursal fluid.   Muscles and tendons   Flexors: Normal.   Extensors: Normal.   Abductors: Normal.   Adductors: Normal.   Gluteals: Normal.   Hamstrings: Normal.   Other findings   No pelvic free fluid. No fluid collection or hematoma. No inguinal  lymphadenopathy. No inguinal hernia.   IMPRESSION:  1. No hip fracture, dislocation or avascular necrosis.  2. Mild osteoarthritis of bilateral hips.  3. Left superior labral degeneration.  4. Degenerative disease with disc height loss and facet arthropathy  at L3-4, L4-5 and L5-S1.  5. Mild subchondral bone marrow edema on either side of the pubic  symphysis as can be seen with mild osteitis pubis.    Electronically Signed    By: Kathreen Devoid M.D.    On: 07/07/2022 04:54   PATIENT SURVEYS:  FOTO initial 50/ goal 65  COGNITION: Overall cognitive status: Within functional limits for tasks assessed     SENSATION: WFL  EDEMA:  NT  MUSCLE LENGTH: Hamstrings: Right NT deg; Left NT deg Marcello Moores test: Right NT deg; Left NT deg  POSTURE: rounded shoulders and forward head  PALPATION: Generalized L hip tenderness.    LOWER/ UPPER EXTREMITY ROM: B UE AROM WFL (no pain in shoulder)- h/o R shoulder surgery in past  B LE AROM WFL (B hip flexion limited to 95 deg./ B hip abduction 30 deg.).    LOWER EXTREMITY MMT:  MMT Right eval Left eval  Hip flexion 4 4-  Hip extension    Hip abduction 4 3+  Hip adduction 4 4  Hip internal rotation    Hip external rotation    Knee flexion 5 5  Knee extension 5 5  Ankle dorsiflexion    Ankle plantarflexion    Ankle inversion    Ankle eversion     (Blank rows = not tested)  B shoulder flexion 4/5 MMT, biceps 5/5 MMT, triceps 4+/5 MMT  LOWER EXTREMITY SPECIAL TESTS:  Hip special tests: Saralyn Pilar (FABER) test: positive  on L, negative on R.    FUNCTIONAL TESTS:  5 times sit to stand: 17.06 sec./ 16.10 sec.  HR: 88 bpm, O2 sat. 100% 3 minute walk  test: 402 feet (increase L hip pain to 4/10)- HR 99 bpm., O2 sat. 99%  Marked fatigue.  Pt. Unable to complete 6 MWT today.    GAIT: Distance walked: in clinic Assistive device utilized: None Level of assistance: Complete Independence Comments: L antalgic gait with occasional shuffling of feet/ limited heel strike.  Cuing to increase hip flexion/ step pattern/ heel strike while walking in hallway.  Increase in L hip pain after 1 minute of walking in hallway.  Discussed use of SPC.  Tandem stance <10 sec./ difficulty with tandem gait and requires light UE assist in //-bars for safety.  TODAY'S TREATMENT:                                                                                                                              DATE: 08/01/2022   Evaluation/ standing hip ex. (Flexion/ abduction/ hamstring curls)- //-bars.  Nustep L2 10 min. B UE/LE 10 min. (Consistent cadence)- no increase c/o L hip pain reported.    PATIENT EDUCATION:  Education details: Walking (discussed SPC)/ gym ex. With dtr. Chrys Racer) Person educated: Patient and Child(ren) Education method: Customer service manager Education comprehension: verbalized understanding and returned demonstration  HOME EXERCISE PROGRAM: TBD  ASSESSMENT:  CLINICAL IMPRESSION: Patient is a pleasant 77 y.o. female who was seen today for physical therapy evaluation and treatment for L hip pain/ generalized deconditioning.   Pt. Presents to PT with moderate L hip pain which is exacerbated with prolonged standing/ walking.  Pt. Presents with LE muscle weakness and generalized fatigue with daily tasks.  Pt. Referred to PT to develop ex. Program to improve pain-free mobility/ function prior to upcoming surgery for cancer.  Pt. Motivated and will benefit from skilled PT services to increase LE muscle strength/ functional mobility to prepare for surgery.    OBJECTIVE IMPAIRMENTS: Abnormal gait, decreased activity tolerance, decreased  balance, decreased endurance, decreased mobility, difficulty walking, decreased ROM, decreased strength, hypomobility, impaired flexibility, improper body mechanics, postural dysfunction, obesity, and pain.   ACTIVITY LIMITATIONS: carrying, bending, standing, stairs, transfers, and locomotion level  PARTICIPATION LIMITATIONS: meal prep, cleaning, shopping, and community activity  PERSONAL FACTORS: Fitness and Past/current experiences are also affecting patient's functional outcome.   REHAB POTENTIAL: Good  CLINICAL DECISION MAKING: Evolving/moderate complexity  EVALUATION COMPLEXITY: Moderate   GOALS: Goals reviewed with patient? Yes  SHORT TERM GOALS: Target date: 08/22/22 Pt. Will be independent with HEP to increase hip strength 1/2 muscle grade to improve standing/ walking tolerance.   Baseline: Goal status: INITIAL   LONG TERM GOALS: Target date: 09/12/22  Pt. Will increase FOTO to 65 to improve pain-free mobility.   Baseline: initial 50 Goal status: INITIAL  2.  Pt. Will complete 5xSTS to <12 seconds to improve functional mobility.   Baseline: see above Goal status: INITIAL  3.  Pt. Able to complete 30 minutes of walking/ exercise with no increase L hip pain to improve generalized conditioning/ daily mobility.    Baseline: pt. Currently not exercising.   Goal status: INITIAL  PLAN:  PT FREQUENCY: 2x/week  PT DURATION: 6 weeks  PLANNED INTERVENTIONS: Therapeutic exercises, Therapeutic activity, Neuromuscular re-education, Balance training, Gait training, Patient/Family education, Self Care, Joint mobilization, Stair training, DME instructions, Aquatic Therapy, Cryotherapy, Traction, Manual therapy, and Re-evaluation  PLAN FOR NEXT SESSION: Issue HEP.  Discuss symptoms after initial evaluation/ weekend walking.   Pura Spice, PT, DPT # [phone]  08/01/2022, 8:44 PM

## 2022-08-03 NOTE — Addendum Note (Signed)
Addended by: Pura Spice on: 08/03/2022 04:24 PM   Modules accepted: Orders

## 2022-08-04 ENCOUNTER — Ambulatory Visit: Payer: Medicare Other

## 2022-08-04 DIAGNOSIS — M25552 Pain in left hip: Secondary | ICD-10-CM

## 2022-08-04 DIAGNOSIS — M6281 Muscle weakness (generalized): Secondary | ICD-10-CM

## 2022-08-04 DIAGNOSIS — R262 Difficulty in walking, not elsewhere classified: Secondary | ICD-10-CM

## 2022-08-04 NOTE — Therapy (Signed)
OUTPATIENT PHYSICAL THERAPY LOWER EXTREMITY TREATMENT   Patient Name: [name] MRN: [id number] DOB:[birth year], 77 y.o., female Today's Date: 08/04/2022  END OF SESSION:  PT End of Session - 08/04/22 1153     Visit Number 1    Number of Visits 12    Date for PT Re-Evaluation 09/12/22    Authorization Type Medicare A & B, VL based on medical necessity    PT Start Time 1150    PT Stop Time 1230    PT Time Calculation (min) 40 min    Activity Tolerance Patient tolerated treatment well    Behavior During Therapy WFL for tasks assessed/performed            Past Medical History:  Diagnosis Date   CKD (chronic kidney disease), stage III ([name])    3b   Diabetes mellitus    DVT (deep venous thrombosis) ([name])    "In my 70's, after abdominal surgery"   Foot fracture, right    History of back surgery 1980   X2   History of colon polyps    History of kidney stones    Hyperlipidemia    Osteoporosis    Pneumonia    PONV (postoperative nausea and vomiting)    Sleep apnea    no CPAP   Vertigo    2 "bad" episodes.  most recent over 5 years ago   Vitamin D deficiency    Wears dentures    full upper   Past Surgical History:  Procedure Laterality Date   ABDOMINAL HYSTERECTOMY     total   [facility]    CARDIOVASCULAR STRESS TEST     abnormal, had cath which was normal   CARPAL TUNNEL RELEASE     CATARACT EXTRACTION W/PHACO Right 02/24/2022   Procedure: CATARACT EXTRACTION PHACO AND INTRAOCULAR LENS PLACEMENT ([name]) RIGHT DIABETIC HYDRUS MICROSTENT;  Surgeon: Brenda Bear, MD;  Location: [name];  Service: Ophthalmology;  Laterality: Right;  Diabetic 4.18 00:44.9   CATARACT EXTRACTION W/PHACO Left 03/10/2022   Procedure: CATARACT EXTRACTION PHACO AND INTRAOCULAR LENS PLACEMENT ([facility]) LEFT DIABETIC HYDRUS MICROSTENT 5.86 00:37.6;  Surgeon: Brenda Bear, MD;  Location: [name];  Service:  Ophthalmology;  Laterality: Left;  Diabetic   CHOLECYSTECTOMY     COLECTOMY     for benign polyps   GLAUCOMA SURGERY     [name]   KIDNEY STONE SURGERY     x3.  [name]   Patient Active Problem List   Diagnosis Date Noted   Coronary artery disease due to lipid rich plaque 03/09/2015   DJD (degenerative joint disease), cervical 08/15/2014   Right shoulder pain 07/03/2014   Hypomagnesemia 03/13/2014   Nephrolithiasis 10/10/2013   Medicare annual wellness visit, subsequent 03/10/2013   Postmenopausal estrogen deficiency 03/10/2013   Hyperlipidemia 02/21/2013   Diabetes mellitus type 2 in obese ([name]) 12/06/2012   Right hip pain 09/06/2012   Morbid obesity ([name]) 01/09/2011   Osteopenia 12/25/2010   PCP: Brenda Marseille, MD  REFERRING PROVIDER: Nelda Marseille, MD  REFERRING DIAG: Unilateral primary osteoarthritis, left hip   THERAPY DIAG:  Pain in left hip  Muscle weakness (generalized)  Difficulty in walking, not elsewhere classified  Rationale for Evaluation and Treatment: Rehabilitation  ONSET DATE: 12/2021 (hip pain)  FROM THE INITIAL EVALUATION SUBJECTIVE:   SUBJECTIVE STATEMENT: Pt. Is currently off all meds except insulin.  7-8/10 L hip/ groin pain at  worst with walking.  Pt. Referred to PT to increase generalized strengthening and improve overall mobility prior to upcoming surgery.  Limited with walking due to pain/ fatigue.  Pt. Able to complete grocery shopping but limited.  No use of assistive device and no falls.    PERTINENT HISTORY:  CANCER HISTORY Extrahepatic Cholangiocarcinoma: - 6 weeks of generalized pruritus - Labs notable for transaminitis and elevated alk phos - 07/20/22: MRCP with severe intra and extrahepatic biliary dilatation extending to the distal CBD - 07/24/22: EUS with normal appearing pancreas, ERCP with structure in the lower third of the main bile duct, bile duct biopsy showing poorly differentiated adenocarcinoma, stent  placed ____________________________________________________________________  ASSESSMENT 1. 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 Weight Loss 3. Nausea 4. Left Hip Pain limiting mobility  RECOMMENDATIONS 1. CT Chest (non contrast d/t allergy) 2. CEA, CA 19-9, CMP, CBC 3. Scheduled to see surgery 4. Anticipate next follow up with med onc after surgery to discuss adjuvant therapy 5. Start work with physical therapy, patient already has referral  DISCUSSION We reviewed her diagnostic work up thus far which shows a localized extrahepatic cholangiocarcinoma. Imaging does not show evidence of nodal disease. We will obtain CT chest to complete staging. As this appears to be a resectable cancer we would generally favor upfront surgery most likely followed by adjuvant capecitabine. She has had significant physical decline recently and her mobility has been especially limited due to her left hip. She was evaluated by an orthopedic surgeon recently but now cancer diagnosis is interrupted this workup. Given the extensive surgery required for resection of her tumor we did discuss the possibility of neoadjuvant chemotherapy and prehabilitation if she is not deemed a current surgical candidate.  This patient was seen and discussed with Dr. Altamease Larson.  Brenda Laine, MD Hematology / Oncology Fellow  ______________________________________________________________________ HISTORY  [name] is seen today at the [facility] for consultation regarding extrahepatic cholangiocarcinoma at the request of Dr. Quita Larson.  She initially presented with over a month of diffuse pruritus. This prompted evaluation in clinic with labs revealing transaminitis and elevated alkaline phosphatase. Subsequent imaging showed narrowing of common bile duct. ERCP with biopsy of distal bile duct revealed adenocarcinoma.  Over the  last few months she has really been limited by left hip pain. She was evaluated by an orthopedic surgeon with plan for a steroid injection but this has been delayed with new cancer diagnosis. She remains independent in ADLs and lives alone. She is now needing assistance with IADLs. For example when she goes grocery shopping she normally sits in the car while her children shop. She denies any chest pain. She does have dyspnea with exertion. No issues with constipation or diarrhea. She has been having some persistent mild nausea without vomiting. Her p.o. intake is also decreased recently. She denies any significant abdominal pain following biliary stent placement.  MEDICAL HISTORY 1. CAD (no prior PCI or surgery) 2. HTN 3. T2DM c/b polyneuropathy, on insulin, follows with endocrinology 4. CKD - G3bA3, follows with nephrology 5. Nephrolithiasis 6. HLD - on rosuvastatin 7. DVT 8. IgG Kappa MGUS  Allergies Allergen Reactions Albuterol Shortness Of Breath Iodine And Iodide Containing Products Hives Metrizamide Swelling Other Swelling Uncoded Allergy. Allergen: SHELLFISH, Other Reaction: OTHER REACTION Tramadol Nausea And Vomiting, Shortness Of Breath and Nausea Only Chest pain Sulfur Other (See Comments) Iodinated Contrast Media Ioxaglate Sodium Metoprolol Other (See Comments) SEVERE BRADYCARDIA IN  30'S UNKNOWN IF IT WAS A TRUE HEART BLCOK Statins-Hmg-Coa Reductase Inhibitors Other (See Comments) Muscle aches and severe fatigue Sulfasalazine Buprenorphine Rash Losartan Nausea Only Nausea, lightheaded Sulfa (Sulfonamide Antibiotics) Rash and Other (See Comments) Trospium Itching  Medications reviewed in the EMR  FAMILY HISTORY Brother thyroid cancer Sister breast cancer  SOCIAL HISTORY Lives alone. Here with son and daughter. Son is a Marine scientist at [employer] surgical clinic. No tobacco, alcohol, or drug use.  REVIEW OF SYSTEMS The remainder of a comprehensive 10 systems review is  negative.  PHYSICAL EXAM BP 156/64  Pulse 66  Temp 36.7 C (98 F) (Temporal)  Resp 18  Ht 156.5 cm (5' 1.61")  Wt 97 kg (213 lb 12.8 oz)  SpO2 100%  BMI 39.60 kg/m GENERAL: well developed, well nourished, in no distress PSYCH: full and appropriate range of affect with good insight and judgement HEENT: NCAT, pupils equal, sclerae anicteric, OP clear without erythema, exudate or ulceration; neck supple without thyromegaly or masses; LYMPH: no cervical, supraclavicular, periumbilical adenopathy PULM: normal resp rate, clear bilaterally without wheezes, rhonchi, rales COR: regular without murmur, rub, gallop GI: abdomen soft, nontender, nondistended, no palpable hepatosplenomegaly EXT: warm and well perfused, no edema SKIN: No rashes  OBJECTIVE DATA LABS 07/16/22: transaminitis, normal bili, elevated alk phos and GGT, Cr 1.87, lipase 160 07/12/22: CBC without cytopenias  PATHOLOGY: reviewed  RADIOLOGY RESULTS (scans are personally reviewed by me and my impression) MRI Abd/MRCP 07/20/22: - biliary dilatation with obstruction in distal CBD, normal appearing liver, GB surgically absent, pancrease with multiple sub centimeter cystic lesions in the hear that 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 We discussed that even small tumors can spread so we do usually do adjuvant therapy. We do not have evidenec that neoadjuvant treatment offers much benefit vs adjuvant therapy, though is certainly something we consider in the setting of preoperative evidence of bulky or high risk nodal disease. I am a little concerned about her conditioning for surgery, so will discuss with multid team after she had spoken with our surgical teams. I saw and evaluated the patient, participating in the key portions of the service. I reviewed the resident's note. I agree with the  resident's findings and plan. I have personally reviewed the patient's medical records and imaging. Ulice Dash, MD   PAIN:  Are you having pain? Yes: NPRS scale: 4-5/10 Pain location: L hip Pain description: aching Aggravating factors: walking Relieving factors: resting/ "getting off the hip"  PRECAUTIONS: None  WEIGHT BEARING RESTRICTIONS: No  FALLS:  Has patient fallen in last 6 months? No  LIVING ENVIRONMENT: Lives with: lives alone  Pt. Has good family support that lives local.   Lives in: House/apartment Stairs: No Has following equipment at home: Ramped entry  OCCUPATION: Retired  PLOF: Independent   PATIENT GOALS: Increase muscle strength/ improve pain-free mobility and conditioning.  NEXT MD VISIT: 08/11/22 (meeting with surgeon)   OBJECTIVE:   DIAGNOSTIC FINDINGS: EXAM:  MR OF THE LEFT HIP WITHOUT CONTRAST   TECHNIQUE:  Multiplanar, multisequence MR imaging was performed. No intravenous  contrast was administered.   COMPARISON:  None Available.   FINDINGS:  Bones:   No hip fracture, dislocation or avascular necrosis.   No periosteal reaction or bone destruction. No aggressive osseous  lesion.   Normal sacrum and sacroiliac joints. No SI joint widening or erosive  changes.   Degenerative disease with  disc height loss and facet arthropathy at  L3-4, L4-5 and L5-S1.   Mild subchondral bone marrow edema on either side of the pubic  symphysis as can be seen with mild osteitis pubis.   Articular cartilage and labrum   Articular cartilage: Partial-thickness cartilage loss of the femoral  head and acetabulum bilaterally.   Labrum:  Left superior labral degeneration.   Joint or bursal effusion   Joint effusion:  No hip joint effusion.  No SI joint effusion.   Bursae:  No bursal fluid.   Muscles and tendons   Flexors: Normal.   Extensors: Normal.   Abductors: Normal.   Adductors: Normal.   Gluteals: Normal.   Hamstrings: Normal.    Other findings   No pelvic free fluid. No fluid collection or hematoma. No inguinal  lymphadenopathy. No inguinal hernia.   IMPRESSION:  1. No hip fracture, dislocation or avascular necrosis.  2. Mild osteoarthritis of bilateral hips.  3. Left superior labral degeneration.  4. Degenerative disease with disc height loss and facet arthropathy  at L3-4, L4-5 and L5-S1.  5. Mild subchondral bone marrow edema on either side of the pubic  symphysis as can be seen with mild osteitis pubis.    On: 07/07/2022 04:54   PATIENT SURVEYS:  FOTO initial 50/ goal 65  COGNITION: Overall cognitive status: Within functional limits for tasks assessed     SENSATION: WFL  EDEMA:  NT  MUSCLE LENGTH: Hamstrings: Right NT deg; Left NT deg Marcello Moores test: Right NT deg; Left NT deg  POSTURE: rounded shoulders and forward head  PALPATION: Generalized L hip tenderness.    LOWER/ UPPER EXTREMITY ROM: B UE AROM WFL (no pain in shoulder)- h/o R shoulder surgery in past B LE AROM WFL (B hip flexion limited to 95 deg./ B hip abduction 30 deg.).    LOWER EXTREMITY MMT:  MMT Right eval Left eval  Hip flexion 4 4-  Hip extension    Hip abduction 4 3+  Hip adduction 4 4  Hip internal rotation    Hip external rotation    Knee flexion 5 5  Knee extension 5 5  Ankle dorsiflexion    Ankle plantarflexion    Ankle inversion    Ankle eversion     (Blank rows = not tested)  B shoulder flexion 4/5 MMT, biceps 5/5 MMT, triceps 4+/5 MMT  LOWER EXTREMITY SPECIAL TESTS:  Hip special tests: Saralyn Pilar (FABER) test: positive  on L, negative on R.    FUNCTIONAL TESTS:  5 times sit to stand: 17.06 sec./ 16.10 sec.  HR: 88 bpm, O2 sat. 100% 3 minute walk test: 402 feet (increase L hip pain to 4/10)- HR 99 bpm., O2 sat. 99%  Marked fatigue.  Pt. Unable to complete 6 MWT today.    GAIT: Distance walked: in clinic Assistive device utilized: None Level of assistance: Complete Independence Comments: L  antalgic gait with occasional shuffling of feet/ limited heel strike.  Cuing to increase hip flexion/ step pattern/ heel strike while walking in hallway.  Increase in L hip pain after 1 minute of walking in hallway.  Discussed use of SPC.  Tandem stance <10 sec./ difficulty with tandem gait and requires light UE assist in //-bars for safety.     TODAY'S TREATMENT:  DATE: 08/04/2022   SUBJECTIVE: Pt reports that she is doing well today but is a little tired upon arrival. She spent the morning cleaning her house and getting rid of things she no longer wants. She went to the gym twice over the weekend and used the NuStep for 20 minutes each time. She has been having some L hip pain however no more than typical. No specific questions or concerns currently.   PAIN: L hip pain (chronic);   Ther-ex  Nustep L1-3 x 10 min. B UE/LE 10 min for LE strengthening and cardiovascular conditioning during interval history, therapist adjusting resistance and monitoring fatigue; Standing exercises in // bars with BUE support and 2# ankle weights (AW): Hip flexion marches x 10 BLE; Hip abduction x 10 BLE; Hamstring curls x 10 BLE; Hip extension x 10 BLE;  Sit to stand from regular height chair with Airex pad on seat 2 x 10; Side stepping in // bars with BUE support and 2# ankle weights x multiple laps; 6" forward step-ups with BUE support alternating leading LE x 10 on each side; 6" lateral step-ups with BUE x 10 toward each direction; Seated clams with green tband 2 x 10; Seated adductor ball squeeze 2 x 10; HEP issued and reviewed with patient;   PATIENT EDUCATION:  Education details: Pt educated throughout session about proper posture and technique with exercises. Improved exercise technique, movement at target joints, use of target muscles after min to mod verbal, visual, tactile  cues. HEP; Person educated: Patient and Child(ren) Education method: Customer service manager and handout Education comprehension: verbalized understanding and returned demonstration   HOME EXERCISE PROGRAM: Access Code: [email or web address] URL: [email or web address] Date: 08/04/2022 Prepared by: Roxana Hires  Exercises - Sit to Stand Without Arm Support  - 1 x daily - 7 x weekly - 2 sets - 10 reps - Standing March with Counter Support  - 1 x daily - 7 x weekly - 2 sets - 10 reps - Standing Hip Abduction with Unilateral Counter Support  - 1 x daily - 7 x weekly - 2 sets - 10 reps - Seated Hip Abduction with Resistance  - 1 x daily - 7 x weekly - 2 sets - 10 reps - Heel Raises with Counter Support  - 1 x daily - 7 x weekly - 2 sets - 10 reps   ASSESSMENT:  CLINICAL IMPRESSION: Progressed strengthening with patient during visit today. She is able to complete all exercises as instructed with mild increase in L hip pain and intermittent seated rest breaks. Issued HEP and reviewed with patient. Plan to progress strengthening at future sessions. Pt. Referred to PT to develop ex. Program to improve pain-free mobility/ function prior to upcoming surgery for cancer.  Pt. Motivated and will benefit from skilled PT services to increase LE muscle strength/ functional mobility to prepare for surgery.    OBJECTIVE IMPAIRMENTS: Abnormal gait, decreased activity tolerance, decreased balance, decreased endurance, decreased mobility, difficulty walking, decreased ROM, decreased strength, hypomobility, impaired flexibility, improper body mechanics, postural dysfunction, obesity, and pain.   ACTIVITY LIMITATIONS: carrying, bending, standing, stairs, transfers, and locomotion level  PARTICIPATION LIMITATIONS: meal prep, cleaning, shopping, and community activity  PERSONAL FACTORS: Fitness and Past/current experiences are also affecting patient's functional outcome.   REHAB POTENTIAL:  Good  CLINICAL DECISION MAKING: Evolving/moderate complexity  EVALUATION COMPLEXITY: Moderate   GOALS: Goals reviewed with patient? Yes  SHORT TERM GOALS: Target date: 08/22/22 Pt. Will be independent with HEP to increase hip strength 1/2 muscle  grade to improve standing/ walking tolerance.   Baseline: Goal status: INITIAL   LONG TERM GOALS: Target date: 09/12/22  Pt. Will increase FOTO to 65 to improve pain-free mobility.   Baseline: initial 50 Goal status: INITIAL  2.  Pt. Will complete 5xSTS to <12 seconds to improve functional mobility.   Baseline: see above Goal status: INITIAL  3.  Pt. Able to complete 30 minutes of walking/ exercise with no increase L hip pain to improve generalized conditioning/ daily mobility.    Baseline: pt. Currently not exercising.   Goal status: INITIAL  PLAN:  PT FREQUENCY: 2x/week  PT DURATION: 6 weeks  PLANNED INTERVENTIONS: Therapeutic exercises, Therapeutic activity, Neuromuscular re-education, Balance training, Gait training, Patient/Family education, Self Care, Joint mobilization, Stair training, DME instructions, Aquatic Therapy, Cryotherapy, Traction, Manual therapy, and Re-evaluation  PLAN FOR NEXT SESSION: Review/modify HEP as needed, progress strengthening.    Lyndel Safe Annleigh Knueppel PT, DPT, GCS  08/04/2022, 1:23 PM

## 2022-08-07 ENCOUNTER — Ambulatory Visit: Payer: Medicare Other

## 2022-08-13 ENCOUNTER — Ambulatory Visit: Payer: Medicare Other | Attending: Internal Medicine | Admitting: Physical Therapy

## 2022-08-13 ENCOUNTER — Encounter: Payer: Self-pay | Admitting: Physical Therapy

## 2022-08-13 DIAGNOSIS — R278 Other lack of coordination: Secondary | ICD-10-CM | POA: Diagnosis present

## 2022-08-13 DIAGNOSIS — M6281 Muscle weakness (generalized): Secondary | ICD-10-CM | POA: Diagnosis present

## 2022-08-13 DIAGNOSIS — M25552 Pain in left hip: Secondary | ICD-10-CM | POA: Diagnosis present

## 2022-08-13 DIAGNOSIS — R262 Difficulty in walking, not elsewhere classified: Secondary | ICD-10-CM | POA: Diagnosis present

## 2022-08-13 NOTE — Therapy (Signed)
OUTPATIENT PHYSICAL THERAPY LOWER EXTREMITY TREATMENT   Patient Name: [name] MRN: [id number] DOB:07-06-1945, 77 y.o., female Today's Date: 08/13/2022  END OF SESSION:  PT End of Session - 08/13/22 1111     Visit Number 3    Number of Visits 12    Date for PT Re-Evaluation 09/12/22    Authorization Type Medicare A & B, VL based on medical necessity    PT Start Time 1111    PT Stop Time 1155    PT Time Calculation (min) 44 min    Activity Tolerance Patient tolerated treatment well    Behavior During Therapy WFL for tasks assessed/performed            Past Medical History:  Diagnosis Date   CKD (chronic kidney disease), stage III    3b   Diabetes mellitus    DVT (deep venous thrombosis)    "In my 42's, after abdominal surgery"   Foot fracture, right    History of back surgery 1980   X2   History of colon polyps    History of kidney stones    Hyperlipidemia    Osteoporosis    Pneumonia    PONV (postoperative nausea and vomiting)    Sleep apnea    no CPAP   Vertigo    2 "bad" episodes.  most recent over 5 years ago   Vitamin D deficiency    Wears dentures    full upper   Past Surgical History:  Procedure Laterality Date   ABDOMINAL HYSTERECTOMY     total   [facility]    CARDIOVASCULAR STRESS TEST     abnormal, had cath which was normal   CARPAL TUNNEL RELEASE     CATARACT EXTRACTION W/PHACO Right 02/24/2022   Procedure: CATARACT EXTRACTION PHACO AND INTRAOCULAR LENS PLACEMENT ([place]) RIGHT DIABETIC HYDRUS MICROSTENT;  Surgeon: Eulogio Bear, MD;  Location: [place];  Service: Ophthalmology;  Laterality: Right;  Diabetic 4.18 00:44.9   CATARACT EXTRACTION W/PHACO Left 03/10/2022   Procedure: CATARACT EXTRACTION PHACO AND INTRAOCULAR LENS PLACEMENT ([facility]) LEFT DIABETIC HYDRUS MICROSTENT 5.86 00:37.6;  Surgeon: Eulogio Bear, MD;  Location: [place];  Service:  Ophthalmology;  Laterality: Left;  Diabetic   CHOLECYSTECTOMY     COLECTOMY     for benign polyps   GLAUCOMA SURGERY     [facility]   KIDNEY STONE SURGERY     x3.  [facility]   Patient Active Problem List   Diagnosis Date Noted   Coronary artery disease due to lipid rich plaque 03/09/2015   DJD (degenerative joint disease), cervical 08/15/2014   Right shoulder pain 07/03/2014   Hypomagnesemia 03/13/2014   Nephrolithiasis 10/10/2013   Medicare annual wellness visit, subsequent 03/10/2013   Postmenopausal estrogen deficiency 03/10/2013   Hyperlipidemia 02/21/2013   Diabetes mellitus type 2 in obese ([place]) 12/06/2012   Right hip pain 09/06/2012   Morbid obesity 01/09/2011   Osteopenia 12/25/2010   PCP: Nelda Marseille, MD  REFERRING PROVIDER: Nelda Marseille, MD  REFERRING DIAG: Unilateral primary osteoarthritis, left hip   THERAPY DIAG:  Pain in left hip  Muscle weakness (generalized)  Difficulty in walking, not elsewhere classified  Other lack of coordination  Rationale for Evaluation and Treatment: Rehabilitation  ONSET DATE: 12/2021 (hip pain)  FROM THE INITIAL EVALUATION SUBJECTIVE:   SUBJECTIVE STATEMENT: Pt. Is currently off all meds except insulin.  7-8/10 L hip/ groin  pain at worst with walking.  Pt. Referred to PT to increase generalized strengthening and improve overall mobility prior to upcoming surgery.  Limited with walking due to pain/ fatigue.  Pt. Able to complete grocery shopping but limited.  No use of assistive device and no falls.    PERTINENT HISTORY:  CANCER HISTORY Extrahepatic Cholangiocarcinoma: - 6 weeks of generalized pruritus - Labs notable for transaminitis and elevated alk phos - 07/20/22: MRCP with severe intra and extrahepatic biliary dilatation extending to the distal CBD - 07/24/22: EUS with normal appearing pancreas, ERCP with structure in the lower third of the main bile duct, bile duct biopsy showing poorly differentiated  adenocarcinoma, stent placed ____________________________________________________________________  ASSESSMENT 1. 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 Weight Loss 3. Nausea 4. Left Hip Pain limiting mobility  RECOMMENDATIONS 1. CT Chest (non contrast d/t allergy) 2. CEA, CA 19-9, CMP, CBC 3. Scheduled to see surgery 4. Anticipate next follow up with med onc after surgery to discuss adjuvant therapy 5. Start work with physical therapy, patient already has referral  DISCUSSION We reviewed her diagnostic work up thus far which shows a localized extrahepatic cholangiocarcinoma. Imaging does not show evidence of nodal disease. We will obtain CT chest to complete staging. As this appears to be a resectable cancer we would generally favor upfront surgery most likely followed by adjuvant capecitabine. She has had significant physical decline recently and her mobility has been especially limited due to her left hip. She was evaluated by an orthopedic surgeon recently but now cancer diagnosis is interrupted this workup. Given the extensive surgery required for resection of her tumor we did discuss the possibility of neoadjuvant chemotherapy and prehabilitation if she is not deemed a current surgical candidate.  This patient was seen and discussed with Dr. Altamease Oiler.  Avelina Laine, MD Hematology / Oncology Fellow  ______________________________________________________________________ HISTORY  [name] is seen today at the [facility] for consultation regarding extrahepatic cholangiocarcinoma at the request of Dr. Quita Skye.  She initially presented with over a month of diffuse pruritus. This prompted evaluation in clinic with labs revealing transaminitis and elevated alkaline phosphatase. Subsequent imaging showed narrowing of common bile duct. ERCP with biopsy of distal bile duct revealed  adenocarcinoma.  Over the last few months she has really been limited by left hip pain. She was evaluated by an orthopedic surgeon with plan for a steroid injection but this has been delayed with new cancer diagnosis. She remains independent in ADLs and lives alone. She is now needing assistance with IADLs. For example when she goes grocery shopping she normally sits in the car while her children shop. She denies any chest pain. She does have dyspnea with exertion. No issues with constipation or diarrhea. She has been having some persistent mild nausea without vomiting. Her p.o. intake is also decreased recently. She denies any significant abdominal pain following biliary stent placement.  MEDICAL HISTORY 1. CAD (no prior PCI or surgery) 2. HTN 3. T2DM c/b polyneuropathy, on insulin, follows with endocrinology 4. CKD - G3bA3, follows with nephrology 5. Nephrolithiasis 6. HLD - on rosuvastatin 7. DVT 8. IgG Kappa MGUS  Allergies Allergen Reactions Albuterol Shortness Of Breath Iodine And Iodide Containing Products Hives Metrizamide Swelling Other Swelling Uncoded Allergy. Allergen: SHELLFISH, Other Reaction: OTHER REACTION Tramadol Nausea And Vomiting, Shortness Of Breath and Nausea Only Chest pain Sulfur Other (See Comments) Iodinated Contrast Media Ioxaglate Sodium Metoprolol Other (See Comments) SEVERE  BRADYCARDIA IN 30'S UNKNOWN IF IT WAS A TRUE HEART BLCOK Statins-Hmg-Coa Reductase Inhibitors Other (See Comments) Muscle aches and severe fatigue Sulfasalazine Buprenorphine Rash Losartan Nausea Only Nausea, lightheaded Sulfa (Sulfonamide Antibiotics) Rash and Other (See Comments) Trospium Itching  Medications reviewed in the EMR  FAMILY HISTORY Brother thyroid cancer Sister breast cancer  SOCIAL HISTORY Lives alone. Here with son and daughter. Son is a Marine scientist at [employer] surgical clinic. No tobacco, alcohol, or drug use.  REVIEW OF SYSTEMS The remainder of a comprehensive 10  systems review is negative.  PHYSICAL EXAM BP 156/64  Pulse 66  Temp 36.7 C (98 F) (Temporal)  Resp 18  Ht 156.5 cm (5' 1.61")  Wt 97 kg (213 lb 12.8 oz)  SpO2 100%  BMI 39.60 kg/m GENERAL: well developed, well nourished, in no distress PSYCH: full and appropriate range of affect with good insight and judgement HEENT: NCAT, pupils equal, sclerae anicteric, OP clear without erythema, exudate or ulceration; neck supple without thyromegaly or masses; LYMPH: no cervical, supraclavicular, periumbilical adenopathy PULM: normal resp rate, clear bilaterally without wheezes, rhonchi, rales COR: regular without murmur, rub, gallop GI: abdomen soft, nontender, nondistended, no palpable hepatosplenomegaly EXT: warm and well perfused, no edema SKIN: No rashes  OBJECTIVE DATA LABS 07/16/22: transaminitis, normal bili, elevated alk phos and GGT, Cr 1.87, lipase 160 07/12/22: CBC without cytopenias  PATHOLOGY: reviewed  RADIOLOGY RESULTS (scans are personally reviewed by me and my impression) MRI Abd/MRCP 07/20/22: - biliary dilatation with obstruction in distal CBD, normal appearing liver, GB surgically absent, pancrease with multiple sub centimeter cystic lesions in the hear that 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 We discussed that even small tumors can spread so we do usually do adjuvant therapy. We do not have evidenec that neoadjuvant treatment offers much benefit vs adjuvant therapy, though is certainly something we consider in the setting of preoperative evidence of bulky or high risk nodal disease. I am a little concerned about her conditioning for surgery, so will discuss with multid team after she had spoken with our surgical teams. I saw and evaluated the patient, participating in the key portions of the service. I reviewed the resident's note. I agree  with the resident's findings and plan. I have personally reviewed the patient's medical records and imaging. Ulice Dash, MD   PAIN:  Are you having pain? Yes: NPRS scale: 4-5/10 Pain location: L hip Pain description: aching Aggravating factors: walking Relieving factors: resting/ "getting off the hip"  PRECAUTIONS: None  WEIGHT BEARING RESTRICTIONS: No  FALLS:  Has patient fallen in last 6 months? No  LIVING ENVIRONMENT: Lives with: lives alone  Pt. Has good family support that lives local.   Lives in: House/apartment Stairs: No Has following equipment at home: Ramped entry  OCCUPATION: Retired  PLOF: Independent   PATIENT GOALS: Increase muscle strength/ improve pain-free mobility and conditioning.  NEXT MD VISIT: 08/11/22 (meeting with surgeon)   OBJECTIVE:   DIAGNOSTIC FINDINGS: EXAM:  MR OF THE LEFT HIP WITHOUT CONTRAST   TECHNIQUE:  Multiplanar, multisequence MR imaging was performed. No intravenous  contrast was administered.   COMPARISON:  None Available.   FINDINGS:  Bones:   No hip fracture, dislocation or avascular necrosis.   No periosteal reaction or bone destruction. No aggressive osseous  lesion.   Normal sacrum and sacroiliac joints. No SI joint widening or erosive  changes.   Degenerative  disease with disc height loss and facet arthropathy at  L3-4, L4-5 and L5-S1.   Mild subchondral bone marrow edema on either side of the pubic  symphysis as can be seen with mild osteitis pubis.   Articular cartilage and labrum   Articular cartilage: Partial-thickness cartilage loss of the femoral  head and acetabulum bilaterally.   Labrum:  Left superior labral degeneration.   Joint or bursal effusion   Joint effusion:  No hip joint effusion.  No SI joint effusion.   Bursae:  No bursal fluid.   Muscles and tendons   Flexors: Normal.   Extensors: Normal.   Abductors: Normal.   Adductors: Normal.   Gluteals: Normal.   Hamstrings:  Normal.   Other findings   No pelvic free fluid. No fluid collection or hematoma. No inguinal  lymphadenopathy. No inguinal hernia.   IMPRESSION:  1. No hip fracture, dislocation or avascular necrosis.  2. Mild osteoarthritis of bilateral hips.  3. Left superior labral degeneration.  4. Degenerative disease with disc height loss and facet arthropathy  at L3-4, L4-5 and L5-S1.  5. Mild subchondral bone marrow edema on either side of the pubic  symphysis as can be seen with mild osteitis pubis.    On: 07/07/2022 04:54   PATIENT SURVEYS:  FOTO initial 50/ goal 65  COGNITION: Overall cognitive status: Within functional limits for tasks assessed     SENSATION: WFL  EDEMA:  NT  MUSCLE LENGTH: Hamstrings: Right NT deg; Left NT deg Marcello Moores test: Right NT deg; Left NT deg  POSTURE: rounded shoulders and forward head  PALPATION: Generalized L hip tenderness.    LOWER/ UPPER EXTREMITY ROM: B UE AROM WFL (no pain in shoulder)- h/o R shoulder surgery in past B LE AROM WFL (B hip flexion limited to 95 deg./ B hip abduction 30 deg.).    LOWER EXTREMITY MMT:  MMT Right eval Left eval  Hip flexion 4 4-  Hip extension    Hip abduction 4 3+  Hip adduction 4 4  Hip internal rotation    Hip external rotation    Knee flexion 5 5  Knee extension 5 5  Ankle dorsiflexion    Ankle plantarflexion    Ankle inversion    Ankle eversion     (Blank rows = not tested)  B shoulder flexion 4/5 MMT, biceps 5/5 MMT, triceps 4+/5 MMT  LOWER EXTREMITY SPECIAL TESTS:  Hip special tests: Saralyn Pilar (FABER) test: positive  on L, negative on R.   FUNCTIONAL TESTS:  5 times sit to stand: 17.06 sec./ 16.10 sec.  HR: 88 bpm, O2 sat. 100% 3 minute walk test: 402 feet (increase L hip pain to 4/10)- HR 99 bpm., O2 sat. 99%  Marked fatigue.  Pt. Unable to complete 6 MWT today.    GAIT: Distance walked: in clinic Assistive device utilized: None Level of assistance: Complete Independence Comments:  L antalgic gait with occasional shuffling of feet/ limited heel strike.  Cuing to increase hip flexion/ step pattern/ heel strike while walking in hallway.  Increase in L hip pain after 1 minute of walking in hallway.  Discussed use of SPC.  Tandem stance <10 sec./ difficulty with tandem gait and requires light UE assist in //-bars for safety.     TODAY'S TREATMENT:  DATE: 08/13/2022  SUBJECTIVE: Pt states she has some decisions to make about surgery/ treatment.  Pt. Is waiting for MDs to meet for tumor board on 4/8.  Pt. C/o 6/10 L hip pain with walking, esp. at [facility] yesterday.    PAIN: L hip pain (chronic)- 6/10 prior to tx.  There.ex.  Walking in hallway (3 laps)- increase in L hip pain and marked L antalgic gait as distance increases.  Nustep L3 x 10 min. B UE/LE (0.36 miles) for LE strengthening and cardiovascular conditioning discussed recent MD reports and upcoming meeting with MD on 4/8;  Standing exercises in // bars with BUE support and 2# ankle weights (AW): Hip flexion marches x 10 BLE; Hip abduction x 10 BLE; Hamstring curls x 10 BLE; Hip extension x 10 BLE;  Sit to stand from regular height chair (no Airex pad on seat today) 2 x 10; Side stepping in // bars with BUE support and 2# ankle weights x 4 laps;  6" forward step-ups (2# ankle wts) with BUE support alternating leading LE x 10 on each side; 6" lateral step-ups (2# ankle wts) with BUE x 10 toward each direction;  Seated clams with moderate manual resistance 2 x 10; Seated adductor ball squeeze 2 x 10;  Walking outside on varying terrain to car at side of building.  Limited outdoor walking today secondary to patient is going to [employer] to buy some pillows.    Reviewed HEP.   PATIENT EDUCATION:  Education details: Pt educated throughout session about proper posture and technique with  exercises. Improved exercise technique, movement at target joints, use of target muscles after min to mod verbal, visual, tactile cues. HEP; Person educated: Patient and Child(ren) Education method: Customer service manager and handout Education comprehension: verbalized understanding and returned demonstration   HOME EXERCISE PROGRAM: Access Code: [email or web address] URL: [email or web address] Date: 08/04/2022 Prepared by: Roxana Hires  Exercises - Sit to Stand Without Arm Support  - 1 x daily - 7 x weekly - 2 sets - 10 reps - Standing March with Counter Support  - 1 x daily - 7 x weekly - 2 sets - 10 reps - Standing Hip Abduction with Unilateral Counter Support  - 1 x daily - 7 x weekly - 2 sets - 10 reps - Seated Hip Abduction with Resistance  - 1 x daily - 7 x weekly - 2 sets - 10 reps - Heel Raises with Counter Support  - 1 x daily - 7 x weekly - 2 sets - 10 reps   ASSESSMENT:  CLINICAL IMPRESSION: Progressed strengthening with patient during visit today. She is able to complete all exercises as instructed with mild increase in L hip pain and intermittent seated rest breaks.  Reviewed HEP and pt. Understands/ no questions. No change to HEP and PT encouraged to walk daily and complete HEP as prescribed.  Pt. Referred to PT to develop ex. Program to improve pain-free mobility/ function prior to upcoming surgery for cancer.  Pt. Motivated and will benefit from skilled PT services to increase LE muscle strength/ functional mobility to prepare for surgery.    OBJECTIVE IMPAIRMENTS: Abnormal gait, decreased activity tolerance, decreased balance, decreased endurance, decreased mobility, difficulty walking, decreased ROM, decreased strength, hypomobility, impaired flexibility, improper body mechanics, postural dysfunction, obesity, and pain.   ACTIVITY LIMITATIONS: carrying, bending, standing, stairs, transfers, and locomotion level  PARTICIPATION LIMITATIONS: meal prep, cleaning,  shopping, and community activity  PERSONAL FACTORS: Fitness and Past/current experiences are also affecting patient's functional outcome.  REHAB POTENTIAL: Good  CLINICAL DECISION MAKING: Evolving/moderate complexity  EVALUATION COMPLEXITY: Moderate   GOALS: Goals reviewed with patient? Yes  SHORT TERM GOALS: Target date: 08/22/22 Pt. Will be independent with HEP to increase hip strength 1/2 muscle grade to improve standing/ walking tolerance.   Baseline: Goal status: INITIAL   LONG TERM GOALS: Target date: 09/12/22  Pt. Will increase FOTO to 65 to improve pain-free mobility.   Baseline: initial 50 Goal status: INITIAL  2.  Pt. Will complete 5xSTS to <12 seconds to improve functional mobility.   Baseline: see above Goal status: INITIAL  3.  Pt. Able to complete 30 minutes of walking/ exercise with no increase L hip pain to improve generalized conditioning/ daily mobility.    Baseline: pt. Currently not exercising.   Goal status: INITIAL  PLAN:  PT FREQUENCY: 2x/week  PT DURATION: 6 weeks  PLANNED INTERVENTIONS: Therapeutic exercises, Therapeutic activity, Neuromuscular re-education, Balance training, Gait training, Patient/Family education, Self Care, Joint mobilization, Stair training, DME instructions, Aquatic Therapy, Cryotherapy, Traction, Manual therapy, and Re-evaluation  PLAN FOR NEXT SESSION: Review/modify HEP as needed, progress strengthening.    Pura Spice, PT, DPT # [id number] 08/13/2022, 12:14 PM

## 2022-08-15 ENCOUNTER — Encounter: Payer: Self-pay | Admitting: Physical Therapy

## 2022-08-15 ENCOUNTER — Ambulatory Visit: Payer: Medicare Other | Admitting: Physical Therapy

## 2022-08-15 DIAGNOSIS — M25552 Pain in left hip: Secondary | ICD-10-CM | POA: Diagnosis not present

## 2022-08-15 DIAGNOSIS — R262 Difficulty in walking, not elsewhere classified: Secondary | ICD-10-CM

## 2022-08-15 DIAGNOSIS — M6281 Muscle weakness (generalized): Secondary | ICD-10-CM

## 2022-08-15 DIAGNOSIS — R278 Other lack of coordination: Secondary | ICD-10-CM

## 2022-08-15 NOTE — Therapy (Signed)
OUTPATIENT PHYSICAL THERAPY LOWER EXTREMITY TREATMENT   Patient Name: [name] MRN: [id number] DOB:Apr 23, 1946, 77 y.o., female Today's Date: 08/15/2022  END OF SESSION:  PT End of Session - 08/15/22 0903     Visit Number 4    Number of Visits 12    Date for PT Re-Evaluation 09/12/22    Authorization Type Medicare A & B, VL based on medical necessity    PT Start Time 0904    PT Stop Time 0946    PT Time Calculation (min) 42 min    Activity Tolerance Patient tolerated treatment well    Behavior During Therapy WFL for tasks assessed/performed            Past Medical History:  Diagnosis Date   CKD (chronic kidney disease), stage III    3b   Diabetes mellitus    DVT (deep venous thrombosis)    "In my 50's, after abdominal surgery"   Foot fracture, right    History of back surgery 1980   X2   History of colon polyps    History of kidney stones    Hyperlipidemia    Osteoporosis    Pneumonia    PONV (postoperative nausea and vomiting)    Sleep apnea    no CPAP   Vertigo    2 "bad" episodes.  most recent over 5 years ago   Vitamin D deficiency    Wears dentures    full upper   Past Surgical History:  Procedure Laterality Date   ABDOMINAL HYSTERECTOMY     total   APPENDECTOMY     CARDIAC CATHETERIZATION     [facility]    CARDIOVASCULAR STRESS TEST     abnormal, had cath which was normal   CARPAL TUNNEL RELEASE     CATARACT EXTRACTION W/PHACO Right 02/24/2022   Procedure: CATARACT EXTRACTION PHACO AND INTRAOCULAR LENS PLACEMENT (IOC) RIGHT DIABETIC HYDRUS MICROSTENT;  Surgeon: Nevada Crane, MD;  Location: [facility];  Service: Ophthalmology;  Laterality: Right;  Diabetic 4.18 00:44.9   CATARACT EXTRACTION W/PHACO Left 03/10/2022   Procedure: CATARACT EXTRACTION PHACO AND INTRAOCULAR LENS PLACEMENT (IOC) LEFT DIABETIC HYDRUS MICROSTENT 5.86 00:37.6;  Surgeon: Nevada Crane, MD;  Location: [facility];  Service:  Ophthalmology;  Laterality: Left;  Diabetic   CHOLECYSTECTOMY     COLECTOMY     for benign polyps   GLAUCOMA SURGERY     [facility]   KIDNEY STONE SURGERY     x3.  [facility]   Patient Active Problem List   Diagnosis Date Noted   Coronary artery disease due to lipid rich plaque 03/09/2015   DJD (degenerative joint disease), cervical 08/15/2014   Right shoulder pain 07/03/2014   Hypomagnesemia 03/13/2014   Nephrolithiasis 10/10/2013   Medicare annual wellness visit, subsequent 03/10/2013   Postmenopausal estrogen deficiency 03/10/2013   Hyperlipidemia 02/21/2013   Diabetes mellitus type 2 in obese (HCC) 12/06/2012   Right hip pain 09/06/2012   Morbid obesity 01/09/2011   Osteopenia 12/25/2010   PCP: Cameron Ali, MD  REFERRING PROVIDER: Cameron Ali, MD  REFERRING DIAG: Unilateral primary osteoarthritis, left hip   THERAPY DIAG:  Pain in left hip  Muscle weakness (generalized)  Difficulty in walking, not elsewhere classified  Other lack of coordination  Rationale for Evaluation and Treatment: Rehabilitation  ONSET DATE: 12/2021 (hip pain)  FROM THE INITIAL EVALUATION SUBJECTIVE:   SUBJECTIVE STATEMENT: Pt. Is currently off all meds except insulin.  7-8/10 L hip/ groin  pain at worst with walking.  Pt. Referred to PT to increase generalized strengthening and improve overall mobility prior to upcoming surgery.  Limited with walking due to pain/ fatigue.  Pt. Able to complete grocery shopping but limited.  No use of assistive device and no falls.    PERTINENT HISTORY:  CANCER HISTORY Extrahepatic Cholangiocarcinoma: - 6 weeks of generalized pruritus - Labs notable for transaminitis and elevated alk phos - 07/20/22: MRCP with severe intra and extrahepatic biliary dilatation extending to the distal CBD - 07/24/22: EUS with normal appearing pancreas, ERCP with structure in the lower third of the main bile duct, bile duct biopsy showing poorly differentiated  adenocarcinoma, stent placed ____________________________________________________________________  ASSESSMENT 1. 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 Weight Loss 3. Nausea 4. Left Hip Pain limiting mobility  RECOMMENDATIONS 1. CT Chest (non contrast d/t allergy) 2. CEA, CA 19-9, CMP, CBC 3. Scheduled to see surgery 4. Anticipate next follow up with med onc after surgery to discuss adjuvant therapy 5. Start work with physical therapy, patient already has referral  DISCUSSION We reviewed her diagnostic work up thus far which shows a localized extrahepatic cholangiocarcinoma. Imaging does not show evidence of nodal disease. We will obtain CT chest to complete staging. As this appears to be a resectable cancer we would generally favor upfront surgery most likely followed by adjuvant capecitabine. She has had significant physical decline recently and her mobility has been especially limited due to her left hip. She was evaluated by an orthopedic surgeon recently but now cancer diagnosis is interrupted this workup. Given the extensive surgery required for resection of her tumor we did discuss the possibility of neoadjuvant chemotherapy and prehabilitation if she is not deemed a current surgical candidate.  This patient was seen and discussed with Dr. Altamease Oiler.  Avelina Laine, MD Hematology / Oncology Fellow  ______________________________________________________________________ HISTORY  [name] is seen today at the [facility] for consultation regarding extrahepatic cholangiocarcinoma at the request of Dr. Quita Skye.  She initially presented with over a month of diffuse pruritus. This prompted evaluation in clinic with labs revealing transaminitis and elevated alkaline phosphatase. Subsequent imaging showed narrowing of common bile duct. ERCP with biopsy of distal bile duct revealed  adenocarcinoma.  Over the last few months she has really been limited by left hip pain. She was evaluated by an orthopedic surgeon with plan for a steroid injection but this has been delayed with new cancer diagnosis. She remains independent in ADLs and lives alone. She is now needing assistance with IADLs. For example when she goes grocery shopping she normally sits in the car while her children shop. She denies any chest pain. She does have dyspnea with exertion. No issues with constipation or diarrhea. She has been having some persistent mild nausea without vomiting. Her p.o. intake is also decreased recently. She denies any significant abdominal pain following biliary stent placement.  MEDICAL HISTORY 1. CAD (no prior PCI or surgery) 2. HTN 3. T2DM c/b polyneuropathy, on insulin, follows with endocrinology 4. CKD - G3bA3, follows with nephrology 5. Nephrolithiasis 6. HLD - on rosuvastatin 7. DVT 8. IgG Kappa MGUS  Allergies Allergen Reactions Albuterol Shortness Of Breath Iodine And Iodide Containing Products Hives Metrizamide Swelling Other Swelling Uncoded Allergy. Allergen: SHELLFISH, Other Reaction: OTHER REACTION Tramadol Nausea And Vomiting, Shortness Of Breath and Nausea Only Chest pain Sulfur Other (See Comments) Iodinated Contrast Media Ioxaglate Sodium Metoprolol Other (See Comments) SEVERE  BRADYCARDIA IN 30'S UNKNOWN IF IT WAS A TRUE HEART BLCOK Statins-Hmg-Coa Reductase Inhibitors Other (See Comments) Muscle aches and severe fatigue Sulfasalazine Buprenorphine Rash Losartan Nausea Only Nausea, lightheaded Sulfa (Sulfonamide Antibiotics) Rash and Other (See Comments) Trospium Itching  Medications reviewed in the EMR  FAMILY HISTORY Brother thyroid cancer Sister breast cancer  SOCIAL HISTORY Lives alone. Here with son and daughter. Son is a Marine scientist at [employer] surgical clinic. No tobacco, alcohol, or drug use.  REVIEW OF SYSTEMS The remainder of a comprehensive 10  systems review is negative.  PHYSICAL EXAM BP 156/64  Pulse 66  Temp 36.7 C (98 F) (Temporal)  Resp 18  Ht 156.5 cm (5' 1.61")  Wt 97 kg (213 lb 12.8 oz)  SpO2 100%  BMI 39.60 kg/m GENERAL: well developed, well nourished, in no distress PSYCH: full and appropriate range of affect with good insight and judgement HEENT: NCAT, pupils equal, sclerae anicteric, OP clear without erythema, exudate or ulceration; neck supple without thyromegaly or masses; LYMPH: no cervical, supraclavicular, periumbilical adenopathy PULM: normal resp rate, clear bilaterally without wheezes, rhonchi, rales COR: regular without murmur, rub, gallop GI: abdomen soft, nontender, nondistended, no palpable hepatosplenomegaly EXT: warm and well perfused, no edema SKIN: No rashes  OBJECTIVE DATA LABS 07/16/22: transaminitis, normal bili, elevated alk phos and GGT, Cr 1.87, lipase 160 07/12/22: CBC without cytopenias  PATHOLOGY: reviewed  RADIOLOGY RESULTS (scans are personally reviewed by me and my impression) MRI Abd/MRCP 07/20/22: - biliary dilatation with obstruction in distal CBD, normal appearing liver, GB surgically absent, pancrease with multiple sub centimeter cystic lesions in the hear that 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 We discussed that even small tumors can spread so we do usually do adjuvant therapy. We do not have evidenec that neoadjuvant treatment offers much benefit vs adjuvant therapy, though is certainly something we consider in the setting of preoperative evidence of bulky or high risk nodal disease. I am a little concerned about her conditioning for surgery, so will discuss with multid team after she had spoken with our surgical teams. I saw and evaluated the patient, participating in the key portions of the service. I reviewed the resident's note. I agree  with the resident's findings and plan. I have personally reviewed the patient's medical records and imaging. Ulice Dash, MD   PAIN:  Are you having pain? Yes: NPRS scale: 4-5/10 Pain location: L hip Pain description: aching Aggravating factors: walking Relieving factors: resting/ "getting off the hip"  PRECAUTIONS: None  WEIGHT BEARING RESTRICTIONS: No  FALLS:  Has patient fallen in last 6 months? No  LIVING ENVIRONMENT: Lives with: lives alone  Pt. Has good family support that lives local.   Lives in: House/apartment Stairs: No Has following equipment at home: Ramped entry  OCCUPATION: Retired  PLOF: Independent   PATIENT GOALS: Increase muscle strength/ improve pain-free mobility and conditioning.  NEXT MD VISIT: 08/11/22 (meeting with surgeon)   OBJECTIVE:   DIAGNOSTIC FINDINGS: EXAM:  MR OF THE LEFT HIP WITHOUT CONTRAST   TECHNIQUE:  Multiplanar, multisequence MR imaging was performed. No intravenous  contrast was administered.   COMPARISON:  None Available.   FINDINGS:  Bones:   No hip fracture, dislocation or avascular necrosis.   No periosteal reaction or bone destruction. No aggressive osseous  lesion.   Normal sacrum and sacroiliac joints. No SI joint widening or erosive  changes.   Degenerative  disease with disc height loss and facet arthropathy at  L3-4, L4-5 and L5-S1.   Mild subchondral bone marrow edema on either side of the pubic  symphysis as can be seen with mild osteitis pubis.   Articular cartilage and labrum   Articular cartilage: Partial-thickness cartilage loss of the femoral  head and acetabulum bilaterally.   Labrum:  Left superior labral degeneration.   Joint or bursal effusion   Joint effusion:  No hip joint effusion.  No SI joint effusion.   Bursae:  No bursal fluid.   Muscles and tendons   Flexors: Normal.   Extensors: Normal.   Abductors: Normal.   Adductors: Normal.   Gluteals: Normal.   Hamstrings:  Normal.   Other findings   No pelvic free fluid. No fluid collection or hematoma. No inguinal  lymphadenopathy. No inguinal hernia.   IMPRESSION:  1. No hip fracture, dislocation or avascular necrosis.  2. Mild osteoarthritis of bilateral hips.  3. Left superior labral degeneration.  4. Degenerative disease with disc height loss and facet arthropathy  at L3-4, L4-5 and L5-S1.  5. Mild subchondral bone marrow edema on either side of the pubic  symphysis as can be seen with mild osteitis pubis.    On: 07/07/2022 04:54   PATIENT SURVEYS:  FOTO initial 50/ goal 65  COGNITION: Overall cognitive status: Within functional limits for tasks assessed     SENSATION: WFL  EDEMA:  NT  MUSCLE LENGTH: Hamstrings: Right NT deg; Left NT deg Marcello Moores test: Right NT deg; Left NT deg  POSTURE: rounded shoulders and forward head  PALPATION: Generalized L hip tenderness.    LOWER/ UPPER EXTREMITY ROM: B UE AROM WFL (no pain in shoulder)- h/o R shoulder surgery in past B LE AROM WFL (B hip flexion limited to 95 deg./ B hip abduction 30 deg.).    LOWER EXTREMITY MMT:  MMT Right eval Left eval  Hip flexion 4 4-  Hip extension    Hip abduction 4 3+  Hip adduction 4 4  Hip internal rotation    Hip external rotation    Knee flexion 5 5  Knee extension 5 5  Ankle dorsiflexion    Ankle plantarflexion    Ankle inversion    Ankle eversion     (Blank rows = not tested)  B shoulder flexion 4/5 MMT, biceps 5/5 MMT, triceps 4+/5 MMT  LOWER EXTREMITY SPECIAL TESTS:  Hip special tests: Saralyn Pilar (FABER) test: positive  on L, negative on R.   FUNCTIONAL TESTS:  5 times sit to stand: 17.06 sec./ 16.10 sec.  HR: 88 bpm, O2 sat. 100% 3 minute walk test: 402 feet (increase L hip pain to 4/10)- HR 99 bpm., O2 sat. 99%  Marked fatigue.  Pt. Unable to complete 6 MWT today.    GAIT: Distance walked: in clinic Assistive device utilized: None Level of assistance: Complete Independence Comments:  L antalgic gait with occasional shuffling of feet/ limited heel strike.  Cuing to increase hip flexion/ step pattern/ heel strike while walking in hallway.  Increase in L hip pain after 1 minute of walking in hallway.  Discussed use of SPC.  Tandem stance <10 sec./ difficulty with tandem gait and requires light UE assist in //-bars for safety.     TODAY'S TREATMENT:  DATE: 08/15/2022  SUBJECTIVE: Pt walked 15 minutes outside this morning before coming to PT.  Pt. C/o 7/10 L hip pain with walking this morning.  Pt. States she was sore in legs after last tx. Session.    PAIN: L hip pain (chronic)- 7/10 prior to tx.  There.ex.  Walking in hallway (2 laps)- increase in L hip pain and marked L antalgic gait as distance increases.  Nustep L3 x 13 min. B UE/LE (0.5 miles) for LE strengthening and cardiovascular conditioning discussed upcoming meeting with MD on 4/8 and importance on daily walking;  HR: 111 bpm, O2 97%  Standing exercises in // bars with BUE support and 3# ankle weights (AW): Hip flexion marches x 20 BLE; Hip abduction x 15 BLE; moderate fatigue L hip Hamstring curls x 20 BLE; Hip extension x 20 BLE; Heel raises x 15 B LE; Seated LAQ x 20 B LE.    Sit to stand from regular height chair (no Airex pad on seat today) 2 x 10; Side stepping in // bars with BUE support and 3# ankle weights x 3 laps;  6" forward step-ups (3# ankle wts) with BUE support alternating leading LE x 10 on each side; No lateral step ups today.    Walking outside on varying terrain to car at side of building.  Discussed use of walking stick or SPC to decrease L hip pain/ antalgic gait pattern with increase distance walked.    Reviewed HEP.   PATIENT EDUCATION:  Education details: Pt educated throughout session about proper posture and technique with exercises. Improved exercise  technique, movement at target joints, use of target muscles after min to mod verbal, visual, tactile cues. HEP; Person educated: Patient and Child(ren) Education method: Medical illustratorxplanation and Demonstration and handout Education comprehension: verbalized understanding and returned demonstration   HOME EXERCISE PROGRAM: Access Code: [email or web address] URL: [email or web address] Date: 08/04/2022 Prepared by: Ria CommentJason Huprich  Exercises - Sit to Stand Without Arm Support  - 1 x daily - 7 x weekly - 2 sets - 10 reps - Standing March with Counter Support  - 1 x daily - 7 x weekly - 2 sets - 10 reps - Standing Hip Abduction with Unilateral Counter Support  - 1 x daily - 7 x weekly - 2 sets - 10 reps - Seated Hip Abduction with Resistance  - 1 x daily - 7 x weekly - 2 sets - 10 reps - Heel Raises with Counter Support  - 1 x daily - 7 x weekly - 2 sets - 10 reps   ASSESSMENT:  CLINICAL IMPRESSION: Progressed strengthening (3# ankle wts) with patient during visit today. She is able to complete all exercises as instructed with mild increase in L hip pain and intermittent seated rest breaks.  Reviewed HEP and pt. Understands/ no questions. No change to HEP and PT encouraged to walk daily and consider using a walking stick/ SPC on R to improve gait pattern.  Pt. Referred to PT to develop ex. Program to improve pain-free mobility/ function prior to upcoming surgery for cancer.  Pt. Motivated and will benefit from skilled PT services to increase LE muscle strength/ functional mobility to prepare for surgery.    OBJECTIVE IMPAIRMENTS: Abnormal gait, decreased activity tolerance, decreased balance, decreased endurance, decreased mobility, difficulty walking, decreased ROM, decreased strength, hypomobility, impaired flexibility, improper body mechanics, postural dysfunction, obesity, and pain.   ACTIVITY LIMITATIONS: carrying, bending, standing, stairs, transfers, and locomotion level  PARTICIPATION  LIMITATIONS: meal prep, cleaning, shopping, and community  activity  PERSONAL FACTORS: Fitness and Past/current experiences are also affecting patient's functional outcome.   REHAB POTENTIAL: Good  CLINICAL DECISION MAKING: Evolving/moderate complexity  EVALUATION COMPLEXITY: Moderate   GOALS: Goals reviewed with patient? Yes  SHORT TERM GOALS: Target date: 08/22/22 Pt. Will be independent with HEP to increase hip strength 1/2 muscle grade to improve standing/ walking tolerance.   Baseline: Goal status: INITIAL   LONG TERM GOALS: Target date: 09/12/22  Pt. Will increase FOTO to 65 to improve pain-free mobility.   Baseline: initial 50 Goal status: INITIAL  2.  Pt. Will complete 5xSTS to <12 seconds to improve functional mobility.   Baseline: see above Goal status: INITIAL  3.  Pt. Able to complete 30 minutes of walking/ exercise with no increase L hip pain to improve generalized conditioning/ daily mobility.    Baseline: pt. Currently not exercising.   Goal status: INITIAL  PLAN:  PT FREQUENCY: 2x/week  PT DURATION: 6 weeks  PLANNED INTERVENTIONS: Therapeutic exercises, Therapeutic activity, Neuromuscular re-education, Balance training, Gait training, Patient/Family education, Self Care, Joint mobilization, Stair training, DME instructions, Aquatic Therapy, Cryotherapy, Traction, Manual therapy, and Re-evaluation  PLAN FOR NEXT SESSION: Progress strengthening/ discussed MD f/u visit.      Cammie McgeeMichael C Timberlee Roblero, PT, DPT # [phone] 08/15/2022, 12:42 PM

## 2022-08-21 ENCOUNTER — Ambulatory Visit: Payer: Medicare Other | Admitting: Physical Therapy

## 2022-08-21 ENCOUNTER — Encounter: Payer: Self-pay | Admitting: Physical Therapy

## 2022-08-21 DIAGNOSIS — R262 Difficulty in walking, not elsewhere classified: Secondary | ICD-10-CM

## 2022-08-21 DIAGNOSIS — M25552 Pain in left hip: Secondary | ICD-10-CM | POA: Diagnosis not present

## 2022-08-21 DIAGNOSIS — R278 Other lack of coordination: Secondary | ICD-10-CM

## 2022-08-21 DIAGNOSIS — M6281 Muscle weakness (generalized): Secondary | ICD-10-CM

## 2022-08-21 NOTE — Therapy (Signed)
OUTPATIENT PHYSICAL THERAPY LOWER EXTREMITY TREATMENT  Patient Name: [name] MRN: [id number] DOB:11/10/1945, 77 y.o., female Today's Date: 08/21/2022  END OF SESSION:  PT End of Session - 08/21/22 0759     Visit Number 5    Number of Visits 12    Date for PT Re-Evaluation 09/12/22    Authorization Type Medicare A & B, VL based on medical necessity    PT Start Time 0814    PT Stop Time 0903    PT Time Calculation (min) 49 min    Activity Tolerance Patient tolerated treatment well    Behavior During Therapy WFL for tasks assessed/performed            Past Medical History:  Diagnosis Date   CKD (chronic kidney disease), stage III    3b   Diabetes mellitus    DVT (deep venous thrombosis)    "In my 50's, after abdominal surgery"   Foot fracture, right    History of back surgery 1980   X2   History of colon polyps    History of kidney stones    Hyperlipidemia    Osteoporosis    Pneumonia    PONV (postoperative nausea and vomiting)    Sleep apnea    no CPAP   Vertigo    2 "bad" episodes.  most recent over 5 years ago   Vitamin D deficiency    Wears dentures    full upper   Past Surgical History:  Procedure Laterality Date   ABDOMINAL HYSTERECTOMY     total   APPENDECTOMY     CARDIAC CATHETERIZATION     [facility]    CARDIOVASCULAR STRESS TEST     abnormal, had cath which was normal   CARPAL TUNNEL RELEASE     CATARACT EXTRACTION W/PHACO Right 02/24/2022   Procedure: CATARACT EXTRACTION PHACO AND INTRAOCULAR LENS PLACEMENT (IOC) RIGHT DIABETIC HYDRUS MICROSTENT;  Surgeon: Brenda Crane, MD;  Location: [facility];  Service: Ophthalmology;  Laterality: Right;  Diabetic 4.18 00:44.9   CATARACT EXTRACTION W/PHACO Left 03/10/2022   Procedure: CATARACT EXTRACTION PHACO AND INTRAOCULAR LENS PLACEMENT (IOC) LEFT DIABETIC HYDRUS MICROSTENT 5.86 00:37.6;  Surgeon: Brenda Crane, MD;  Location: [facility];  Service:  Ophthalmology;  Laterality: Left;  Diabetic   CHOLECYSTECTOMY     COLECTOMY     for benign polyps   GLAUCOMA SURGERY     [facility]   KIDNEY STONE SURGERY     x3.  [facility]   Patient Active Problem List   Diagnosis Date Noted   Coronary artery disease due to lipid rich plaque 03/09/2015   DJD (degenerative joint disease), cervical 08/15/2014   Right shoulder pain 07/03/2014   Hypomagnesemia 03/13/2014   Nephrolithiasis 10/10/2013   Medicare annual wellness visit, subsequent 03/10/2013   Postmenopausal estrogen deficiency 03/10/2013   Hyperlipidemia 02/21/2013   Diabetes mellitus type 2 in obese (HCC) 12/06/2012   Right hip pain 09/06/2012   Morbid obesity 01/09/2011   Osteopenia 12/25/2010   PCP: Brenda Ali, MD  REFERRING PROVIDER: Cameron Ali, MD  REFERRING DIAG: Unilateral primary osteoarthritis, left hip   THERAPY DIAG:  Pain in left hip  Muscle weakness (generalized)  Difficulty in walking, not elsewhere classified  Other lack of coordination  Rationale for Evaluation and Treatment: Rehabilitation  ONSET DATE: 12/2021 (hip pain)  FROM THE INITIAL EVALUATION SUBJECTIVE:   SUBJECTIVE STATEMENT: Pt. Is currently off all meds except insulin.  7-8/10 L hip/ groin pain  at worst with walking.  Pt. Referred to PT to increase generalized strengthening and improve overall mobility prior to upcoming surgery.  Limited with walking due to pain/ fatigue.  Pt. Able to complete grocery shopping but limited.  No use of assistive device and no falls.    PERTINENT HISTORY:  CANCER HISTORY Extrahepatic Cholangiocarcinoma: - 6 weeks of generalized pruritus - Labs notable for transaminitis and elevated alk phos - 07/20/22: MRCP with severe intra and extrahepatic biliary dilatation extending to the distal CBD - 07/24/22: EUS with normal appearing pancreas, ERCP with structure in the lower third of the main bile duct, bile duct biopsy showing poorly differentiated  adenocarcinoma, stent placed ____________________________________________________________________  ASSESSMENT 1. 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 Weight Loss 3. Nausea 4. Left Hip Pain limiting mobility  RECOMMENDATIONS 1. CT Chest (non contrast d/t allergy) 2. CEA, CA 19-9, CMP, CBC 3. Scheduled to see surgery 4. Anticipate next follow up with med onc after surgery to discuss adjuvant therapy 5. Start work with physical therapy, patient already has referral  DISCUSSION We reviewed her diagnostic work up thus far which shows a localized extrahepatic cholangiocarcinoma. Imaging does not show evidence of nodal disease. We will obtain CT chest to complete staging. As this appears to be a resectable cancer we would generally favor upfront surgery most likely followed by adjuvant capecitabine. She has had significant physical decline recently and her mobility has been especially limited due to her left hip. She was evaluated by an orthopedic surgeon recently but now cancer diagnosis is interrupted this workup. Given the extensive surgery required for resection of her tumor we did discuss the possibility of neoadjuvant chemotherapy and prehabilitation if she is not deemed a current surgical candidate.  This patient was seen and discussed with [name].  Brenda Richters, MD Hematology / Oncology Fellow  ______________________________________________________________________ HISTORY  [name] is seen today at the [facility] for consultation regarding extrahepatic cholangiocarcinoma at the request of Dr. Amada Larson.  She initially presented with over a month of diffuse pruritus. This prompted evaluation in clinic with labs revealing transaminitis and elevated alkaline phosphatase. Subsequent imaging showed narrowing of common bile duct. ERCP with biopsy of distal bile duct revealed  adenocarcinoma.  Over the last few months she has really been limited by left hip pain. She was evaluated by an orthopedic surgeon with plan for a steroid injection but this has been delayed with new cancer diagnosis. She remains independent in ADLs and lives alone. She is now needing assistance with IADLs. For example when she goes grocery shopping she normally sits in the car while her children shop. She denies any chest pain. She does have dyspnea with exertion. No issues with constipation or diarrhea. She has been having some persistent mild nausea without vomiting. Her p.o. intake is also decreased recently. She denies any significant abdominal pain following biliary stent placement.  MEDICAL HISTORY 1. CAD (no prior PCI or surgery) 2. HTN 3. T2DM c/b polyneuropathy, on insulin, follows with endocrinology 4. CKD - G3bA3, follows with nephrology 5. Nephrolithiasis 6. HLD - on rosuvastatin 7. DVT 8. IgG Kappa MGUS  Allergies Allergen Reactions Albuterol Shortness Of Breath Iodine And Iodide Containing Products Hives Metrizamide Swelling Other Swelling Uncoded Allergy. Allergen: SHELLFISH, Other Reaction: OTHER REACTION Tramadol Nausea And Vomiting, Shortness Of Breath and Nausea Only Chest pain Sulfur Other (See Comments) Iodinated Contrast Media Ioxaglate Sodium Metoprolol Other (See Comments) SEVERE BRADYCARDIA  IN 30'S UNKNOWN IF IT WAS A TRUE HEART BLCOK Statins-Hmg-Coa Reductase Inhibitors Other (See Comments) Muscle aches and severe fatigue Sulfasalazine Buprenorphine Rash Losartan Nausea Only Nausea, lightheaded Sulfa (Sulfonamide Antibiotics) Rash and Other (See Comments) Trospium Itching  Medications reviewed in the EMR  FAMILY HISTORY Brother thyroid cancer Sister breast cancer  SOCIAL HISTORY Lives alone. Here with son and daughter. Son is a Engineer, civil (consulting) at [employer] surgical clinic. No tobacco, alcohol, or drug use.  REVIEW OF SYSTEMS The remainder of a comprehensive 10  systems review is negative.  PHYSICAL EXAM BP 156/64  Pulse 66  Temp 36.7 C (98 F) (Temporal)  Resp 18  Ht 156.5 cm (5' 1.61")  Wt 97 kg (213 lb 12.8 oz)  SpO2 100%  BMI 39.60 kg/m GENERAL: well developed, well nourished, in no distress PSYCH: full and appropriate range of affect with good insight and judgement HEENT: NCAT, pupils equal, sclerae anicteric, OP clear without erythema, exudate or ulceration; neck supple without thyromegaly or masses; LYMPH: no cervical, supraclavicular, periumbilical adenopathy PULM: normal resp rate, clear bilaterally without wheezes, rhonchi, rales COR: regular without murmur, rub, gallop GI: abdomen soft, nontender, nondistended, no palpable hepatosplenomegaly EXT: warm and well perfused, no edema SKIN: No rashes  OBJECTIVE DATA LABS 07/16/22: transaminitis, normal bili, elevated alk phos and GGT, Cr 1.87, lipase 160 07/12/22: CBC without cytopenias  PATHOLOGY: reviewed  RADIOLOGY RESULTS (scans are personally reviewed by me and my impression) MRI Abd/MRCP 07/20/22: - biliary dilatation with obstruction in distal CBD, normal appearing liver, GB surgically absent, pancrease with multiple sub centimeter cystic lesions in the hear that 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 We discussed that even small tumors can spread so we do usually do adjuvant therapy. We do not have evidenec that neoadjuvant treatment offers much benefit vs adjuvant therapy, though is certainly something we consider in the setting of preoperative evidence of bulky or high risk nodal disease. I am a little concerned about her conditioning for surgery, so will discuss with multid team after she had spoken with our surgical teams. I saw and evaluated the patient, participating in the key portions of the service. I reviewed the resident's note. I agree  with the resident's findings and plan. I have personally reviewed the patient's medical records and imaging. Kathi Ludwig, MD   PAIN:  Are you having pain? Yes: NPRS scale: 4-5/10 Pain location: L hip Pain description: aching Aggravating factors: walking Relieving factors: resting/ "getting off the hip"  PRECAUTIONS: None  WEIGHT BEARING RESTRICTIONS: No  FALLS:  Has patient fallen in last 6 months? No  LIVING ENVIRONMENT: Lives with: lives alone  Pt. Has good family support that lives local.   Lives in: House/apartment Stairs: No Has following equipment at home: Ramped entry  OCCUPATION: Retired  PLOF: Independent   PATIENT GOALS: Increase muscle strength/ improve pain-free mobility and conditioning.  NEXT MD VISIT: 08/11/22 (meeting with surgeon)   OBJECTIVE:   DIAGNOSTIC FINDINGS: EXAM:  MR OF THE LEFT HIP WITHOUT CONTRAST   TECHNIQUE:  Multiplanar, multisequence MR imaging was performed. No intravenous  contrast was administered.   COMPARISON:  None Available.   FINDINGS:  Bones:   No hip fracture, dislocation or avascular necrosis.   No periosteal reaction or bone destruction. No aggressive osseous  lesion.   Normal sacrum and sacroiliac joints. No SI joint widening or erosive  changes.   Degenerative disease  with disc height loss and facet arthropathy at  L3-4, L4-5 and L5-S1.   Mild subchondral bone marrow edema on either side of the pubic  symphysis as can be seen with mild osteitis pubis.   Articular cartilage and labrum   Articular cartilage: Partial-thickness cartilage loss of the femoral  head and acetabulum bilaterally.   Labrum:  Left superior labral degeneration.   Joint or bursal effusion   Joint effusion:  No hip joint effusion.  No SI joint effusion.   Bursae:  No bursal fluid.   Muscles and tendons   Flexors: Normal.   Extensors: Normal.   Abductors: Normal.   Adductors: Normal.   Gluteals: Normal.   Hamstrings:  Normal.   Other findings   No pelvic free fluid. No fluid collection or hematoma. No inguinal  lymphadenopathy. No inguinal hernia.   IMPRESSION:  1. No hip fracture, dislocation or avascular necrosis.  2. Mild osteoarthritis of bilateral hips.  3. Left superior labral degeneration.  4. Degenerative disease with disc height loss and facet arthropathy  at L3-4, L4-5 and L5-S1.  5. Mild subchondral bone marrow edema on either side of the pubic  symphysis as can be seen with mild osteitis pubis.    On: 07/07/2022 04:54   PATIENT SURVEYS:  FOTO initial 50/ goal 65  COGNITION: Overall cognitive status: Within functional limits for tasks assessed     SENSATION: WFL  EDEMA:  NT  MUSCLE LENGTH: Hamstrings: Right NT deg; Left NT deg Maisie Fus test: Right NT deg; Left NT deg  POSTURE: rounded shoulders and forward head  PALPATION: Generalized L hip tenderness.    LOWER/ UPPER EXTREMITY ROM: B UE AROM WFL (no pain in shoulder)- h/o R shoulder surgery in past B LE AROM WFL (B hip flexion limited to 95 deg./ B hip abduction 30 deg.).    LOWER EXTREMITY MMT:  MMT Right eval Left eval  Hip flexion 4 4-  Hip extension    Hip abduction 4 3+  Hip adduction 4 4  Hip internal rotation    Hip external rotation    Knee flexion 5 5  Knee extension 5 5  Ankle dorsiflexion    Ankle plantarflexion    Ankle inversion    Ankle eversion     (Blank rows = not tested)  B shoulder flexion 4/5 MMT, biceps 5/5 MMT, triceps 4+/5 MMT  LOWER EXTREMITY SPECIAL TESTS:  Hip special tests: Luisa Hart (FABER) test: positive  on L, negative on R.   FUNCTIONAL TESTS:  5 times sit to stand: 17.06 sec./ 16.10 sec.  HR: 88 bpm, O2 sat. 100% 3 minute walk test: 402 feet (increase L hip pain to 4/10)- HR 99 bpm., O2 sat. 99%  Marked fatigue.  Pt. Unable to complete 6 MWT today.    GAIT: Distance walked: in clinic Assistive device utilized: None Level of assistance: Complete Independence Comments:  L antalgic gait with occasional shuffling of feet/ limited heel strike.  Cuing to increase hip flexion/ step pattern/ heel strike while walking in hallway.  Increase in L hip pain after 1 minute of walking in hallway.  Discussed use of SPC.  Tandem stance <10 sec./ difficulty with tandem gait and requires light UE assist in //-bars for safety.     TODAY'S TREATMENT:  DATE: 08/21/2022  SUBJECTIVE: Pt. C/o 4/10 L hip pain with walking this morning and using Nustep.  Pt. Is planning to start 3 months of chemo and postponed surgery until after chemo.  See MD notes (may have port placed)   PAIN: L hip pain (chronic)- 4/10 prior to tx.  There.ex.  Walking in hallway (3 laps)- pt. Reports feeling nauseous today.    Nustep L4-3 x 10 min. B UE/LE (0.41 miles) for LE strengthening and cardiovascular conditioning.  Standing exercises in // bars with BUE support and 3# ankle weights (AW): Hip flexion marches x 20 BLE; Hip abduction x 20 BLE; moderate fatigue/discomfort in L hip Hamstring curls x 20 BLE; Hip extension x 20 BLE; Heel raises x 20 B LE; Seated LAQ x 20 B LE.    Sit to stand from regular height chair (no Airex pad on seat today) 2 x 10; Side stepping in // bars with BUE support and 3# ankle weights x 3 laps;  Walking partial lunges in //-bars 2 laps (cuing to flex knee/ hold).    6" forward step-ups with BUE support alternating leading LE x 10 on each side; No lateral step ups today.    Walking outside around PT building on sidewalk to car.  Discussed use of walking stick or SPC to decrease L hip pain/ antalgic gait pattern with increase distance walked.    Reviewed HEP.   PATIENT EDUCATION:  Education details: Pt educated throughout session about proper posture and technique with exercises. Improved exercise technique, movement at target joints, use of  target muscles after min to mod verbal, visual, tactile cues. HEP; Person educated: Patient and Child(ren) Education method: Medical illustrator and handout Education comprehension: verbalized understanding and returned demonstration   HOME EXERCISE PROGRAM: Access Code: [email or web address] URL: [email or web address] Date: 08/04/2022 Prepared by: Ria Comment  Exercises - Sit to Stand Without Arm Support  - 1 x daily - 7 x weekly - 2 sets - 10 reps - Standing March with Counter Support  - 1 x daily - 7 x weekly - 2 sets - 10 reps - Standing Hip Abduction with Unilateral Counter Support  - 1 x daily - 7 x weekly - 2 sets - 10 reps - Seated Hip Abduction with Resistance  - 1 x daily - 7 x weekly - 2 sets - 10 reps - Heel Raises with Counter Support  - 1 x daily - 7 x weekly - 2 sets - 10 reps   ASSESSMENT:  CLINICAL IMPRESSION: Pt. is able to complete all exercises as instructed with mild increase in L hip pain and intermittent seated rest breaks.  No change to HEP and PT encouraged to walk daily and consider using a walking stick/ SPC on R to improve gait pattern. Pt. Instructed to contact PT if any changes in tx. POC per MD/ chemo tx. Sessions.  Pt. Referred to PT to develop ex. Program to improve pain-free mobility/ function prior to upcoming surgery for cancer.  Pt. Motivated and will benefit from skilled PT services to increase LE muscle strength/ functional mobility to prepare for surgery.    OBJECTIVE IMPAIRMENTS: Abnormal gait, decreased activity tolerance, decreased balance, decreased endurance, decreased mobility, difficulty walking, decreased ROM, decreased strength, hypomobility, impaired flexibility, improper body mechanics, postural dysfunction, obesity, and pain.   ACTIVITY LIMITATIONS: carrying, bending, standing, stairs, transfers, and locomotion level  PARTICIPATION LIMITATIONS: meal prep, cleaning, shopping, and community activity  PERSONAL FACTORS:  Fitness and Past/current experiences are also affecting patient's  functional outcome.   REHAB POTENTIAL: Good  CLINICAL DECISION MAKING: Evolving/moderate complexity  EVALUATION COMPLEXITY: Moderate   GOALS: Goals reviewed with patient? Yes  SHORT TERM GOALS: Target date: 08/22/22 Pt. Will be independent with HEP to increase hip strength 1/2 muscle grade to improve standing/ walking tolerance.   Baseline: Goal status: On-going   LONG TERM GOALS: Target date: 09/12/22  Pt. Will increase FOTO to 65 to improve pain-free mobility.   Baseline: initial 50 Goal status: INITIAL  2.  Pt. Will complete 5xSTS to <12 seconds to improve functional mobility.   Baseline: see above Goal status: INITIAL  3.  Pt. Able to complete 30 minutes of walking/ exercise with no increase L hip pain to improve generalized conditioning/ daily mobility.    Baseline: pt. Currently not exercising.   Goal status: INITIAL  PLAN:  PT FREQUENCY: 2x/week  PT DURATION: 6 weeks  PLANNED INTERVENTIONS: Therapeutic exercises, Therapeutic activity, Neuromuscular re-education, Balance training, Gait training, Patient/Family education, Self Care, Joint mobilization, Stair training, DME instructions, Aquatic Therapy, Cryotherapy, Traction, Manual therapy, and Re-evaluation  PLAN FOR NEXT SESSION: Progress strengthening/ 6MWT     Cammie McgeeMichael C Dinesha Twiggs, PT, DPT # [id number] 08/21/2022, 12:52 PM

## 2022-08-22 ENCOUNTER — Ambulatory Visit: Payer: Medicare Other | Admitting: Physical Therapy

## 2022-08-22 ENCOUNTER — Encounter: Payer: Self-pay | Admitting: Physical Therapy

## 2022-08-22 DIAGNOSIS — M25552 Pain in left hip: Secondary | ICD-10-CM | POA: Diagnosis not present

## 2022-08-22 DIAGNOSIS — R262 Difficulty in walking, not elsewhere classified: Secondary | ICD-10-CM

## 2022-08-22 DIAGNOSIS — R278 Other lack of coordination: Secondary | ICD-10-CM

## 2022-08-22 DIAGNOSIS — M6281 Muscle weakness (generalized): Secondary | ICD-10-CM

## 2022-08-22 NOTE — Therapy (Signed)
OUTPATIENT PHYSICAL THERAPY LOWER EXTREMITY TREATMENT  Patient Name: [name] MRN: [id number] DOB:January 23, 1946, 77 y.o., female Today's Date: 08/22/2022  END OF SESSION:  PT End of Session - 08/22/22 0726     Visit Number 6    Number of Visits 12    Date for PT Re-Evaluation 09/12/22    Authorization Type Medicare A & B, VL based on medical necessity    PT Start Time 0813    PT Stop Time 0901    PT Time Calculation (min) 48 min    Activity Tolerance Patient tolerated treatment well    Behavior During Therapy WFL for tasks assessed/performed            Past Medical History:  Diagnosis Date   CKD (chronic kidney disease), stage III    3b   Diabetes mellitus    DVT (deep venous thrombosis)    "In my 50's, after abdominal surgery"   Foot fracture, right    History of back surgery 1980   X2   History of colon polyps    History of kidney stones    Hyperlipidemia    Osteoporosis    Pneumonia    PONV (postoperative nausea and vomiting)    Sleep apnea    no CPAP   Vertigo    2 "bad" episodes.  most recent over 5 years ago   Vitamin D deficiency    Wears dentures    full upper   Past Surgical History:  Procedure Laterality Date   ABDOMINAL HYSTERECTOMY     total   APPENDECTOMY     CARDIAC CATHETERIZATION     [facility]    CARDIOVASCULAR STRESS TEST     abnormal, had cath which was normal   CARPAL TUNNEL RELEASE     CATARACT EXTRACTION W/PHACO Right 02/24/2022   Procedure: CATARACT EXTRACTION PHACO AND INTRAOCULAR LENS PLACEMENT (IOC) RIGHT DIABETIC HYDRUS MICROSTENT;  Surgeon: Brenda Crane, MD;  Location: [facility];  Service: Ophthalmology;  Laterality: Right;  Diabetic 4.18 00:44.9   CATARACT EXTRACTION W/PHACO Left 03/10/2022   Procedure: CATARACT EXTRACTION PHACO AND INTRAOCULAR LENS PLACEMENT (IOC) LEFT DIABETIC HYDRUS MICROSTENT 5.86 00:37.6;  Surgeon: Brenda Crane, MD;  Location: [facility];  Service:  Ophthalmology;  Laterality: Left;  Diabetic   CHOLECYSTECTOMY     COLECTOMY     for benign polyps   GLAUCOMA SURGERY     [facility]   KIDNEY STONE SURGERY     x3.  [facility]   Patient Active Problem List   Diagnosis Date Noted   Coronary artery disease due to lipid rich plaque 03/09/2015   DJD (degenerative joint disease), cervical 08/15/2014   Right shoulder pain 07/03/2014   Hypomagnesemia 03/13/2014   Nephrolithiasis 10/10/2013   Medicare annual wellness visit, subsequent 03/10/2013   Postmenopausal estrogen deficiency 03/10/2013   Hyperlipidemia 02/21/2013   Diabetes mellitus type 2 in obese (HCC) 12/06/2012   Right hip pain 09/06/2012   Morbid obesity 01/09/2011   Osteopenia 12/25/2010   PCP: Brenda Ali, MD  REFERRING PROVIDER: Cameron Ali, MD  REFERRING DIAG: Unilateral primary osteoarthritis, left hip   THERAPY DIAG:  Pain in left hip  Muscle weakness (generalized)  Difficulty in walking, not elsewhere classified  Other lack of coordination  Rationale for Evaluation and Treatment: Rehabilitation  ONSET DATE: 12/2021 (hip pain)  FROM THE INITIAL EVALUATION SUBJECTIVE:   SUBJECTIVE STATEMENT: Pt. Is currently off all meds except insulin.  7-8/10 L hip/ groin pain  at worst with walking.  Pt. Referred to PT to increase generalized strengthening and improve overall mobility prior to upcoming surgery.  Limited with walking due to pain/ fatigue.  Pt. Able to complete grocery shopping but limited.  No use of assistive device and no falls.    PERTINENT HISTORY:  CANCER HISTORY Extrahepatic Cholangiocarcinoma: - 6 weeks of generalized pruritus - Labs notable for transaminitis and elevated alk phos - 07/20/22: MRCP with severe intra and extrahepatic biliary dilatation extending to the distal CBD - 07/24/22: EUS with normal appearing pancreas, ERCP with structure in the lower third of the main bile duct, bile duct biopsy showing poorly differentiated  adenocarcinoma, stent placed ____________________________________________________________________  ASSESSMENT 1. 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 Weight Loss 3. Nausea 4. Left Hip Pain limiting mobility  RECOMMENDATIONS 1. CT Chest (non contrast d/t allergy) 2. CEA, CA 19-9, CMP, CBC 3. Scheduled to see surgery 4. Anticipate next follow up with med onc after surgery to discuss adjuvant therapy 5. Start work with physical therapy, patient already has referral  DISCUSSION We reviewed her diagnostic work up thus far which shows a localized extrahepatic cholangiocarcinoma. Imaging does not show evidence of nodal disease. We will obtain CT chest to complete staging. As this appears to be a resectable cancer we would generally favor upfront surgery most likely followed by adjuvant capecitabine. She has had significant physical decline recently and her mobility has been especially limited due to her left hip. She was evaluated by an orthopedic surgeon recently but now cancer diagnosis is interrupted this workup. Given the extensive surgery required for resection of her tumor we did discuss the possibility of neoadjuvant chemotherapy and prehabilitation if she is not deemed a current surgical candidate.  This patient was seen and discussed with [name].  Brenda Richters, MD Hematology / Oncology Fellow  ______________________________________________________________________ HISTORY  [name] is seen today at the [facility] for consultation regarding extrahepatic cholangiocarcinoma at the request of Dr. Amada Larson.  She initially presented with over a month of diffuse pruritus. This prompted evaluation in clinic with labs revealing transaminitis and elevated alkaline phosphatase. Subsequent imaging showed narrowing of common bile duct. ERCP with biopsy of distal bile duct revealed  adenocarcinoma.  Over the last few months she has really been limited by left hip pain. She was evaluated by an orthopedic surgeon with plan for a steroid injection but this has been delayed with new cancer diagnosis. She remains independent in ADLs and lives alone. She is now needing assistance with IADLs. For example when she goes grocery shopping she normally sits in the car while her children shop. She denies any chest pain. She does have dyspnea with exertion. No issues with constipation or diarrhea. She has been having some persistent mild nausea without vomiting. Her p.o. intake is also decreased recently. She denies any significant abdominal pain following biliary stent placement.  MEDICAL HISTORY 1. CAD (no prior PCI or surgery) 2. HTN 3. T2DM c/b polyneuropathy, on insulin, follows with endocrinology 4. CKD - G3bA3, follows with nephrology 5. Nephrolithiasis 6. HLD - on rosuvastatin 7. DVT 8. IgG Kappa MGUS  Allergies Allergen Reactions Albuterol Shortness Of Breath Iodine And Iodide Containing Products Hives Metrizamide Swelling Other Swelling Uncoded Allergy. Allergen: SHELLFISH, Other Reaction: OTHER REACTION Tramadol Nausea And Vomiting, Shortness Of Breath and Nausea Only Chest pain Sulfur Other (See Comments) Iodinated Contrast Media Ioxaglate Sodium Metoprolol Other (See Comments) SEVERE BRADYCARDIA  IN 30'S UNKNOWN IF IT WAS A TRUE HEART BLCOK Statins-Hmg-Coa Reductase Inhibitors Other (See Comments) Muscle aches and severe fatigue Sulfasalazine Buprenorphine Rash Losartan Nausea Only Nausea, lightheaded Sulfa (Sulfonamide Antibiotics) Rash and Other (See Comments) Trospium Itching  Medications reviewed in the EMR  FAMILY HISTORY Brother thyroid cancer Sister breast cancer  SOCIAL HISTORY Lives alone. Here with son and daughter. Son is a Engineer, civil (consulting) at [employer] surgical clinic. No tobacco, alcohol, or drug use.  REVIEW OF SYSTEMS The remainder of a comprehensive 10  systems review is negative.  PHYSICAL EXAM BP 156/64  Pulse 66  Temp 36.7 C (98 F) (Temporal)  Resp 18  Ht 156.5 cm (5' 1.61")  Wt 97 kg (213 lb 12.8 oz)  SpO2 100%  BMI 39.60 kg/m GENERAL: well developed, well nourished, in no distress PSYCH: full and appropriate range of affect with good insight and judgement HEENT: NCAT, pupils equal, sclerae anicteric, OP clear without erythema, exudate or ulceration; neck supple without thyromegaly or masses; LYMPH: no cervical, supraclavicular, periumbilical adenopathy PULM: normal resp rate, clear bilaterally without wheezes, rhonchi, rales COR: regular without murmur, rub, gallop GI: abdomen soft, nontender, nondistended, no palpable hepatosplenomegaly EXT: warm and well perfused, no edema SKIN: No rashes  OBJECTIVE DATA LABS 07/16/22: transaminitis, normal bili, elevated alk phos and GGT, Cr 1.87, lipase 160 07/12/22: CBC without cytopenias  PATHOLOGY: reviewed  RADIOLOGY RESULTS (scans are personally reviewed by me and my impression) MRI Abd/MRCP 07/20/22: - biliary dilatation with obstruction in distal CBD, normal appearing liver, GB surgically absent, pancrease with multiple sub centimeter cystic lesions in the hear that 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 We discussed that even small tumors can spread so we do usually do adjuvant therapy. We do not have evidenec that neoadjuvant treatment offers much benefit vs adjuvant therapy, though is certainly something we consider in the setting of preoperative evidence of bulky or high risk nodal disease. I am a little concerned about her conditioning for surgery, so will discuss with multid team after she had spoken with our surgical teams. I saw and evaluated the patient, participating in the key portions of the service. I reviewed the resident's note. I agree  with the resident's findings and plan. I have personally reviewed the patient's medical records and imaging. Kathi Ludwig, MD   PAIN:  Are you having pain? Yes: NPRS scale: 4-5/10 Pain location: L hip Pain description: aching Aggravating factors: walking Relieving factors: resting/ "getting off the hip"  PRECAUTIONS: None  WEIGHT BEARING RESTRICTIONS: No  FALLS:  Has patient fallen in last 6 months? No  LIVING ENVIRONMENT: Lives with: lives alone  Pt. Has good family support that lives local.   Lives in: House/apartment Stairs: No Has following equipment at home: Ramped entry  OCCUPATION: Retired  PLOF: Independent   PATIENT GOALS: Increase muscle strength/ improve pain-free mobility and conditioning.  NEXT MD VISIT: 08/11/22 (meeting with surgeon)   OBJECTIVE:   DIAGNOSTIC FINDINGS: EXAM:  MR OF THE LEFT HIP WITHOUT CONTRAST   TECHNIQUE:  Multiplanar, multisequence MR imaging was performed. No intravenous  contrast was administered.   COMPARISON:  None Available.   FINDINGS:  Bones:   No hip fracture, dislocation or avascular necrosis.   No periosteal reaction or bone destruction. No aggressive osseous  lesion.   Normal sacrum and sacroiliac joints. No SI joint widening or erosive  changes.   Degenerative disease  with disc height loss and facet arthropathy at  L3-4, L4-5 and L5-S1.   Mild subchondral bone marrow edema on either side of the pubic  symphysis as can be seen with mild osteitis pubis.   Articular cartilage and labrum   Articular cartilage: Partial-thickness cartilage loss of the femoral  head and acetabulum bilaterally.   Labrum:  Left superior labral degeneration.   Joint or bursal effusion   Joint effusion:  No hip joint effusion.  No SI joint effusion.   Bursae:  No bursal fluid.   Muscles and tendons   Flexors: Normal.   Extensors: Normal.   Abductors: Normal.   Adductors: Normal.   Gluteals: Normal.   Hamstrings:  Normal.   Other findings   No pelvic free fluid. No fluid collection or hematoma. No inguinal  lymphadenopathy. No inguinal hernia.   IMPRESSION:  1. No hip fracture, dislocation or avascular necrosis.  2. Mild osteoarthritis of bilateral hips.  3. Left superior labral degeneration.  4. Degenerative disease with disc height loss and facet arthropathy  at L3-4, L4-5 and L5-S1.  5. Mild subchondral bone marrow edema on either side of the pubic  symphysis as can be seen with mild osteitis pubis.    On: 07/07/2022 04:54   PATIENT SURVEYS:  FOTO initial 50/ goal 65  COGNITION: Overall cognitive status: Within functional limits for tasks assessed     SENSATION: WFL  EDEMA:  NT  MUSCLE LENGTH: Hamstrings: Right NT deg; Left NT deg Maisie Fus test: Right NT deg; Left NT deg  POSTURE: rounded shoulders and forward head  PALPATION: Generalized L hip tenderness.    LOWER/ UPPER EXTREMITY ROM: B UE AROM WFL (no pain in shoulder)- h/o R shoulder surgery in past B LE AROM WFL (B hip flexion limited to 95 deg./ B hip abduction 30 deg.).    LOWER EXTREMITY MMT:  MMT Right eval Left eval  Hip flexion 4 4-  Hip extension    Hip abduction 4 3+  Hip adduction 4 4  Hip internal rotation    Hip external rotation    Knee flexion 5 5  Knee extension 5 5  Ankle dorsiflexion    Ankle plantarflexion    Ankle inversion    Ankle eversion     (Blank rows = not tested)  B shoulder flexion 4/5 MMT, biceps 5/5 MMT, triceps 4+/5 MMT  LOWER EXTREMITY SPECIAL TESTS:  Hip special tests: Luisa Hart (FABER) test: positive  on L, negative on R.   FUNCTIONAL TESTS:  5 times sit to stand: 17.06 sec./ 16.10 sec.  HR: 88 bpm, O2 sat. 100% 3 minute walk test: 402 feet (increase L hip pain to 4/10)- HR 99 bpm., O2 sat. 99%  Marked fatigue.  Pt. Unable to complete 6 MWT today.    GAIT: Distance walked: in clinic Assistive device utilized: None Level of assistance: Complete Independence Comments:  L antalgic gait with occasional shuffling of feet/ limited heel strike.  Cuing to increase hip flexion/ step pattern/ heel strike while walking in hallway.  Increase in L hip pain after 1 minute of walking in hallway.  Discussed use of SPC.  Tandem stance <10 sec./ difficulty with tandem gait and requires light UE assist in //-bars for safety.     TODAY'S TREATMENT:  DATE: 08/22/2022  SUBJECTIVE: Pt. C/o 6/10 L hip pain with walking this morning and using Nustep.  Pt. Went to grocery store to shop/walk after PT treatment yesterday.    PAIN: L hip pain (chronic)- 6/10 prior to tx.  There.ex.   Nustep L3 x 11 min. B UE/LE (0.50 miles) for LE strengthening and cardiovascular conditioning.  6" forward step ups/downs at stairs (10x2) on L/R.       Standing exercises in // bars with BUE support and no ankle weights today: Hip flexion marches x 20 BLE; Hip abduction x 20 BLE;  Hamstring curls x 20 BLE; Hip extension x 20 BLE; Heel raises x 20 B LE; Seated LAQ x 20 B LE.   Good technique/ only 1 rest break required.    Sit to stand from regular height chair 15x;  Forward/backwards walking 6 laps in //-bars (cuing to use mirror for posture feedback).    Walking in hallway/ outside around PT building on sidewalk to car.  Used SPC on R side to decrease L hip pain with 2-point gait pattern in hallway.  Pt. Hesitant to use SPC on a regular basis but understands the benefits.    Reviewed HEP.   PATIENT EDUCATION:  Education details: Pt educated throughout session about proper posture and technique with exercises. Improved exercise technique, movement at target joints, use of target muscles after min to mod verbal, visual, tactile cues. HEP; Person educated: Patient and Child(ren) Education method: Medical illustrator and handout Education comprehension:  verbalized understanding and returned demonstration   HOME EXERCISE PROGRAM: Access Code: [email or web address] URL: [email or web address] Date: 08/04/2022 Prepared by: Ria Comment  Exercises - Sit to Stand Without Arm Support  - 1 x daily - 7 x weekly - 2 sets - 10 reps - Standing March with Counter Support  - 1 x daily - 7 x weekly - 2 sets - 10 reps - Standing Hip Abduction with Unilateral Counter Support  - 1 x daily - 7 x weekly - 2 sets - 10 reps - Seated Hip Abduction with Resistance  - 1 x daily - 7 x weekly - 2 sets - 10 reps - Heel Raises with Counter Support  - 1 x daily - 7 x weekly - 2 sets - 10 reps   ASSESSMENT:  CLINICAL IMPRESSION: Pt. is able to complete all exercises as instructed with mild increase in L hip pain and intermittent seated rest breaks.  No change to HEP and PT encouraged to walk daily and consider using a walking stick/ SPC on R to improve gait pattern. Pt. Instructed to contact PT if any changes in tx. POC per MD/ chemo tx. Sessions.  Pt. Scheduled to start chemo tx. Next Thursday.  Pt. Referred to PT to develop ex. Program to improve pain-free mobility/ function prior to upcoming surgery for cancer.  Pt. Motivated and will benefit from skilled PT services to increase LE muscle strength/ functional mobility to prepare for surgery.    OBJECTIVE IMPAIRMENTS: Abnormal gait, decreased activity tolerance, decreased balance, decreased endurance, decreased mobility, difficulty walking, decreased ROM, decreased strength, hypomobility, impaired flexibility, improper body mechanics, postural dysfunction, obesity, and pain.   ACTIVITY LIMITATIONS: carrying, bending, standing, stairs, transfers, and locomotion level  PARTICIPATION LIMITATIONS: meal prep, cleaning, shopping, and community activity  PERSONAL FACTORS: Fitness and Past/current experiences are also affecting patient's functional outcome.   REHAB POTENTIAL: Good  CLINICAL DECISION MAKING:  Evolving/moderate complexity  EVALUATION COMPLEXITY: Moderate   GOALS: Goals reviewed with patient? Yes  SHORT TERM GOALS: Target date: 08/22/22 Pt. Will be independent with HEP to increase hip strength 1/2 muscle grade to improve standing/ walking tolerance.   Baseline: Goal status: Not met   LONG TERM GOALS: Target date: 09/12/22  Pt. Will increase FOTO to 65 to improve pain-free mobility.   Baseline: initial 50 Goal status: INITIAL  2.  Pt. Will complete 5xSTS to <12 seconds to improve functional mobility.   Baseline: see above Goal status: INITIAL  3.  Pt. Able to complete 30 minutes of walking/ exercise with no increase L hip pain to improve generalized conditioning/ daily mobility.    Baseline: pt. Currently not exercising.   Goal status: INITIAL  PLAN:  PT FREQUENCY: 2x/week  PT DURATION: 6 weeks  PLANNED INTERVENTIONS: Therapeutic exercises, Therapeutic activity, Neuromuscular re-education, Balance training, Gait training, Patient/Family education, Self Care, Joint mobilization, Stair training, DME instructions, Aquatic Therapy, Cryotherapy, Traction, Manual therapy, and Re-evaluation  PLAN FOR NEXT SESSION: Progress strengthening/     Cammie Mcgee, PT, DPT # [phone] 08/22/2022, 7:34 PM

## 2022-08-25 ENCOUNTER — Ambulatory Visit: Payer: Medicare Other | Admitting: Physical Therapy

## 2022-08-25 DIAGNOSIS — M25552 Pain in left hip: Secondary | ICD-10-CM

## 2022-08-25 DIAGNOSIS — M6281 Muscle weakness (generalized): Secondary | ICD-10-CM

## 2022-08-25 DIAGNOSIS — R278 Other lack of coordination: Secondary | ICD-10-CM

## 2022-08-25 DIAGNOSIS — R262 Difficulty in walking, not elsewhere classified: Secondary | ICD-10-CM

## 2022-08-25 NOTE — Therapy (Signed)
OUTPATIENT PHYSICAL THERAPY LOWER EXTREMITY TREATMENT  Patient Name: [name] MRN: [id number] DOB:1945/08/05, 77 y.o., female Today's Date: 08/25/2022  END OF SESSION:  PT End of Session - 08/25/22 0930     Visit Number 7    Number of Visits 12    Date for PT Re-Evaluation 09/12/22    Authorization Type Medicare A & B, VL based on medical necessity    PT Start Time 1025    PT Stop Time 1111    PT Time Calculation (min) 46 min    Activity Tolerance Patient tolerated treatment well    Behavior During Therapy WFL for tasks assessed/performed            Past Medical History:  Diagnosis Date   CKD (chronic kidney disease), stage III    3b   Diabetes mellitus    DVT (deep venous thrombosis)    "In my 50's, after abdominal surgery"   Foot fracture, right    History of back surgery 1980   X2   History of colon polyps    History of kidney stones    Hyperlipidemia    Osteoporosis    Pneumonia    PONV (postoperative nausea and vomiting)    Sleep apnea    no CPAP   Vertigo    2 "bad" episodes.  most recent over 5 years ago   Vitamin D deficiency    Wears dentures    full upper   Past Surgical History:  Procedure Laterality Date   ABDOMINAL HYSTERECTOMY     total   APPENDECTOMY     CARDIAC CATHETERIZATION     [facility]    CARDIOVASCULAR STRESS TEST     abnormal, had cath which was normal   CARPAL TUNNEL RELEASE     CATARACT EXTRACTION W/PHACO Right 02/24/2022   Procedure: CATARACT EXTRACTION PHACO AND INTRAOCULAR LENS PLACEMENT (IOC) RIGHT DIABETIC HYDRUS MICROSTENT;  Surgeon: Nevada Crane, MD;  Location: [facility];  Service: Ophthalmology;  Laterality: Right;  Diabetic 4.18 00:44.9   CATARACT EXTRACTION W/PHACO Left 03/10/2022   Procedure: CATARACT EXTRACTION PHACO AND INTRAOCULAR LENS PLACEMENT (IOC) LEFT DIABETIC HYDRUS MICROSTENT 5.86 00:37.6;  Surgeon: Nevada Crane, MD;  Location: [facility];  Service:  Ophthalmology;  Laterality: Left;  Diabetic   CHOLECYSTECTOMY     COLECTOMY     for benign polyps   GLAUCOMA SURGERY     [facility]   KIDNEY STONE SURGERY     x3.  [facility]   Patient Active Problem List   Diagnosis Date Noted   Coronary artery disease due to lipid rich plaque 03/09/2015   DJD (degenerative joint disease), cervical 08/15/2014   Right shoulder pain 07/03/2014   Hypomagnesemia 03/13/2014   Nephrolithiasis 10/10/2013   Medicare annual wellness visit, subsequent 03/10/2013   Postmenopausal estrogen deficiency 03/10/2013   Hyperlipidemia 02/21/2013   Diabetes mellitus type 2 in obese (HCC) 12/06/2012   Right hip pain 09/06/2012   Morbid obesity 01/09/2011   Osteopenia 12/25/2010   PCP: Cameron Ali, MD  REFERRING PROVIDER: Cameron Ali, MD  REFERRING DIAG: Unilateral primary osteoarthritis, left hip   THERAPY DIAG:  Pain in left hip  Muscle weakness (generalized)  Difficulty in walking, not elsewhere classified  Other lack of coordination  Rationale for Evaluation and Treatment: Rehabilitation  ONSET DATE: 12/2021 (hip pain)  FROM THE INITIAL EVALUATION SUBJECTIVE:   SUBJECTIVE STATEMENT: Pt. Is currently off all meds except insulin.  7-8/10 L hip/ groin pain  at worst with walking.  Pt. Referred to PT to increase generalized strengthening and improve overall mobility prior to upcoming surgery.  Limited with walking due to pain/ fatigue.  Pt. Able to complete grocery shopping but limited.  No use of assistive device and no falls.    PERTINENT HISTORY:  CANCER HISTORY Extrahepatic Cholangiocarcinoma: - 6 weeks of generalized pruritus - Labs notable for transaminitis and elevated alk phos - 07/20/22: MRCP with severe intra and extrahepatic biliary dilatation extending to the distal CBD - 07/24/22: EUS with normal appearing pancreas, ERCP with structure in the lower third of the main bile duct, bile duct biopsy showing poorly differentiated  adenocarcinoma, stent placed ____________________________________________________________________  ASSESSMENT 1. 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 Weight Loss 3. Nausea 4. Left Hip Pain limiting mobility  RECOMMENDATIONS 1. CT Chest (non contrast d/t allergy) 2. CEA, CA 19-9, CMP, CBC 3. Scheduled to see surgery 4. Anticipate next follow up with med onc after surgery to discuss adjuvant therapy 5. Start work with physical therapy, patient already has referral  DISCUSSION We reviewed her diagnostic work up thus far which shows a localized extrahepatic cholangiocarcinoma. Imaging does not show evidence of nodal disease. We will obtain CT chest to complete staging. As this appears to be a resectable cancer we would generally favor upfront surgery most likely followed by adjuvant capecitabine. She has had significant physical decline recently and her mobility has been especially limited due to her left hip. She was evaluated by an orthopedic surgeon recently but now cancer diagnosis is interrupted this workup. Given the extensive surgery required for resection of her tumor we did discuss the possibility of neoadjuvant chemotherapy and prehabilitation if she is not deemed a current surgical candidate.  This patient was seen and discussed with Dr. Forbes Cellar.  [name], MD Hematology / Oncology Fellow  ______________________________________________________________________ HISTORY  [name] is seen today at the [facility] for consultation regarding extrahepatic cholangiocarcinoma at the request of Dr. Amada Jupiter.  She initially presented with over a month of diffuse pruritus. This prompted evaluation in clinic with labs revealing transaminitis and elevated alkaline phosphatase. Subsequent imaging showed narrowing of common bile duct. ERCP with biopsy of distal bile duct revealed  adenocarcinoma.  Over the last few months she has really been limited by left hip pain. She was evaluated by an orthopedic surgeon with plan for a steroid injection but this has been delayed with new cancer diagnosis. She remains independent in ADLs and lives alone. She is now needing assistance with IADLs. For example when she goes grocery shopping she normally sits in the car while her children shop. She denies any chest pain. She does have dyspnea with exertion. No issues with constipation or diarrhea. She has been having some persistent mild nausea without vomiting. Her p.o. intake is also decreased recently. She denies any significant abdominal pain following biliary stent placement.  MEDICAL HISTORY 1. CAD (no prior PCI or surgery) 2. HTN 3. T2DM c/b polyneuropathy, on insulin, follows with endocrinology 4. CKD - G3bA3, follows with nephrology 5. Nephrolithiasis 6. HLD - on rosuvastatin 7. DVT 8. IgG Kappa MGUS  Allergies Allergen Reactions Albuterol Shortness Of Breath Iodine And Iodide Containing Products Hives Metrizamide Swelling Other Swelling Uncoded Allergy. Allergen: SHELLFISH, Other Reaction: OTHER REACTION Tramadol Nausea And Vomiting, Shortness Of Breath and Nausea Only Chest pain Sulfur Other (See Comments) Iodinated Contrast Media Ioxaglate Sodium Metoprolol Other (See Comments) SEVERE BRADYCARDIA  IN 30'S UNKNOWN IF IT WAS A TRUE HEART BLCOK Statins-Hmg-Coa Reductase Inhibitors Other (See Comments) Muscle aches and severe fatigue Sulfasalazine Buprenorphine Rash Losartan Nausea Only Nausea, lightheaded Sulfa (Sulfonamide Antibiotics) Rash and Other (See Comments) Trospium Itching  Medications reviewed in the EMR  FAMILY HISTORY Brother thyroid cancer Sister breast cancer  SOCIAL HISTORY Lives alone. Here with son and daughter. Son is a Engineer, civil (consulting) at [employer] surgical clinic. No tobacco, alcohol, or drug use.  REVIEW OF SYSTEMS The remainder of a comprehensive 10  systems review is negative.  PHYSICAL EXAM BP 156/64  Pulse 66  Temp 36.7 C (98 F) (Temporal)  Resp 18  Ht 156.5 cm (5' 1.61")  Wt 97 kg (213 lb 12.8 oz)  SpO2 100%  BMI 39.60 kg/m GENERAL: well developed, well nourished, in no distress PSYCH: full and appropriate range of affect with good insight and judgement HEENT: NCAT, pupils equal, sclerae anicteric, OP clear without erythema, exudate or ulceration; neck supple without thyromegaly or masses; LYMPH: no cervical, supraclavicular, periumbilical adenopathy PULM: normal resp rate, clear bilaterally without wheezes, rhonchi, rales COR: regular without murmur, rub, gallop GI: abdomen soft, nontender, nondistended, no palpable hepatosplenomegaly EXT: warm and well perfused, no edema SKIN: No rashes  OBJECTIVE DATA LABS 07/16/22: transaminitis, normal bili, elevated alk phos and GGT, Cr 1.87, lipase 160 07/12/22: CBC without cytopenias  PATHOLOGY: reviewed  RADIOLOGY RESULTS (scans are personally reviewed by me and my impression) MRI Abd/MRCP 07/20/22: - biliary dilatation with obstruction in distal CBD, normal appearing liver, GB surgically absent, pancrease with multiple sub centimeter cystic lesions in the hear that 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 We discussed that even small tumors can spread so we do usually do adjuvant therapy. We do not have evidenec that neoadjuvant treatment offers much benefit vs adjuvant therapy, though is certainly something we consider in the setting of preoperative evidence of bulky or high risk nodal disease. I am a little concerned about her conditioning for surgery, so will discuss with multid team after she had spoken with our surgical teams. I saw and evaluated the patient, participating in the key portions of the service. I reviewed the resident's note. I agree  with the resident's findings and plan. I have personally reviewed the patient's medical records and imaging. Kathi Ludwig, MD   PAIN:  Are you having pain? Yes: NPRS scale: 4-5/10 Pain location: L hip Pain description: aching Aggravating factors: walking Relieving factors: resting/ "getting off the hip"  PRECAUTIONS: None  WEIGHT BEARING RESTRICTIONS: No  FALLS:  Has patient fallen in last 6 months? No  LIVING ENVIRONMENT: Lives with: lives alone  Pt. Has good family support that lives local.   Lives in: House/apartment Stairs: No Has following equipment at home: Ramped entry  OCCUPATION: Retired  PLOF: Independent   PATIENT GOALS: Increase muscle strength/ improve pain-free mobility and conditioning.  NEXT MD VISIT: 08/11/22 (meeting with surgeon)   OBJECTIVE:   DIAGNOSTIC FINDINGS: EXAM:  MR OF THE LEFT HIP WITHOUT CONTRAST   TECHNIQUE:  Multiplanar, multisequence MR imaging was performed. No intravenous  contrast was administered.   COMPARISON:  None Available.   FINDINGS:  Bones:   No hip fracture, dislocation or avascular necrosis.   No periosteal reaction or bone destruction. No aggressive osseous  lesion.   Normal sacrum and sacroiliac joints. No SI joint widening or erosive  changes.   Degenerative disease  with disc height loss and facet arthropathy at  L3-4, L4-5 and L5-S1.   Mild subchondral bone marrow edema on either side of the pubic  symphysis as can be seen with mild osteitis pubis.   Articular cartilage and labrum   Articular cartilage: Partial-thickness cartilage loss of the femoral  head and acetabulum bilaterally.   Labrum:  Left superior labral degeneration.   Joint or bursal effusion   Joint effusion:  No hip joint effusion.  No SI joint effusion.   Bursae:  No bursal fluid.   Muscles and tendons   Flexors: Normal.   Extensors: Normal.   Abductors: Normal.   Adductors: Normal.   Gluteals: Normal.   Hamstrings:  Normal.   Other findings   No pelvic free fluid. No fluid collection or hematoma. No inguinal  lymphadenopathy. No inguinal hernia.   IMPRESSION:  1. No hip fracture, dislocation or avascular necrosis.  2. Mild osteoarthritis of bilateral hips.  3. Left superior labral degeneration.  4. Degenerative disease with disc height loss and facet arthropathy  at L3-4, L4-5 and L5-S1.  5. Mild subchondral bone marrow edema on either side of the pubic  symphysis as can be seen with mild osteitis pubis.    On: 07/07/2022 04:54   PATIENT SURVEYS:  FOTO initial 50/ goal 65  COGNITION: Overall cognitive status: Within functional limits for tasks assessed     SENSATION: WFL  EDEMA:  NT  MUSCLE LENGTH: Hamstrings: Right NT deg; Left NT deg Maisie Fus test: Right NT deg; Left NT deg  POSTURE: rounded shoulders and forward head  PALPATION: Generalized L hip tenderness.    LOWER/ UPPER EXTREMITY ROM: B UE AROM WFL (no pain in shoulder)- h/o R shoulder surgery in past B LE AROM WFL (B hip flexion limited to 95 deg./ B hip abduction 30 deg.).    LOWER EXTREMITY MMT:  MMT Right eval Left eval  Hip flexion 4 4-  Hip extension    Hip abduction 4 3+  Hip adduction 4 4  Hip internal rotation    Hip external rotation    Knee flexion 5 5  Knee extension 5 5  Ankle dorsiflexion    Ankle plantarflexion    Ankle inversion    Ankle eversion     (Blank rows = not tested)  B shoulder flexion 4/5 MMT, biceps 5/5 MMT, triceps 4+/5 MMT  LOWER EXTREMITY SPECIAL TESTS:  Hip special tests: Luisa Hart (FABER) test: positive  on L, negative on R.   FUNCTIONAL TESTS:  5 times sit to stand: 17.06 sec./ 16.10 sec.  HR: 88 bpm, O2 sat. 100% 3 minute walk test: 402 feet (increase L hip pain to 4/10)- HR 99 bpm., O2 sat. 99%  Marked fatigue.  Pt. Unable to complete 6 MWT today.    GAIT: Distance walked: in clinic Assistive device utilized: None Level of assistance: Complete Independence Comments:  L antalgic gait with occasional shuffling of feet/ limited heel strike.  Cuing to increase hip flexion/ step pattern/ heel strike while walking in hallway.  Increase in L hip pain after 1 minute of walking in hallway.  Discussed use of SPC.  Tandem stance <10 sec./ difficulty with tandem gait and requires light UE assist in //-bars for safety.     TODAY'S TREATMENT:  DATE: 08/25/2022  SUBJECTIVE: Pt. C/o 5/10 L hip pain walking into PT clinic this morning.  Pt. States she was busy this weekend.  Lots of walking reported at 2 different cook outs this weekend.      PAIN: L hip pain (chronic)- 5/10 prior to tx.    There.ex.   Nustep L3 x 10 min. B UE/LE (0.42 miles) for LE strengthening and cardiovascular conditioning.  :  963 feet with marked increase in L hip pain to 7/10 at the 3 minute 50 sec. Mark.  HRL 122 bpm/ 98% O2 sat.      6" forward step touches in //-bars (10x2) on L/R.  No UE assist needed.  Good technique/ only 1 rest break required due to increase in L hip pain.    Standing exercises in // bars with BUE support and no ankle weights today: Hip flexion marches x 20 BLE; Hip abduction x 20 BLE;  Hamstring curls x 20 BLE; Hip extension x 20 BLE; Heel raises x 20 B LE; Seated LAQ x 20 B LE.       Lateral walking L/R in //-bars with no UE assist.  Cuing to correct upright posture.  Increase L hip discomfort.    Tandem/ SLS in //-bars.  Limited SLS <10 sec. But >20 sec. With tandem stance.    Walking around building 1x after tx. To car (increase L hip pain 7/10)- no rest breaks.    Reviewed HEP.   PATIENT EDUCATION:  Education details: Pt educated throughout session about proper posture and technique with exercises. Improved exercise technique, movement at target joints, use of target muscles after min to mod verbal, visual, tactile cues.  HEP; Person educated: Patient and Child(ren) Education method: Medical illustrator and handout Education comprehension: verbalized understanding and returned demonstration   HOME EXERCISE PROGRAM: Access Code: [email or web address] URL: [email or web address] Date: 08/04/2022 Prepared by: Ria Comment  Exercises - Sit to Stand Without Arm Support  - 1 x daily - 7 x weekly - 2 sets - 10 reps - Standing March with Counter Support  - 1 x daily - 7 x weekly - 2 sets - 10 reps - Standing Hip Abduction with Unilateral Counter Support  - 1 x daily - 7 x weekly - 2 sets - 10 reps - Seated Hip Abduction with Resistance  - 1 x daily - 7 x weekly - 2 sets - 10 reps - Heel Raises with Counter Support  - 1 x daily - 7 x weekly - 2 sets - 10 reps   ASSESSMENT:  CLINICAL IMPRESSION: Pt. is able to complete all exercises as instructed with increase in L hip pain during and intermittent seated rest breaks.  No change to HEP and PT encouraged to walk daily and consider using a walking stick/ SPC on R to improve gait pattern.  Pt. Instructed to contact PT if any changes in tx. POC per MD/ chemo tx. Sessions.  Pt. Will only have PT 1x this week secondary to start of chemo this Thursday.  Pt. Referred to PT to develop ex. Program to improve pain-free mobility/ function prior to upcoming surgery for cancer.  Pt. Motivated and will benefit from skilled PT services to increase LE muscle strength/ functional mobility to prepare for surgery.    OBJECTIVE IMPAIRMENTS: Abnormal gait, decreased activity tolerance, decreased balance, decreased endurance, decreased mobility, difficulty walking, decreased ROM, decreased strength, hypomobility, impaired flexibility, improper body mechanics, postural dysfunction, obesity, and pain.   ACTIVITY LIMITATIONS: carrying, bending,  standing, stairs, transfers, and locomotion level  PARTICIPATION LIMITATIONS: meal prep, cleaning, shopping, and community  activity  PERSONAL FACTORS: Fitness and Past/current experiences are also affecting patient's functional outcome.   REHAB POTENTIAL: Good  CLINICAL DECISION MAKING: Evolving/moderate complexity  EVALUATION COMPLEXITY: Moderate   GOALS: Goals reviewed with patient? Yes  SHORT TERM GOALS: Target date: 08/22/22 Pt. Will be independent with HEP to increase hip strength 1/2 muscle grade to improve standing/ walking tolerance.   Baseline: Goal status: Not met   LONG TERM GOALS: Target date: 09/12/22  Pt. Will increase FOTO to 65 to improve pain-free mobility.   Baseline: initial 50 Goal status: INITIAL  2.  Pt. Will complete 5xSTS to <12 seconds to improve functional mobility.   Baseline: see above Goal status: INITIAL  3.  Pt. Able to complete 30 minutes of walking/ exercise with no increase L hip pain to improve generalized conditioning/ daily mobility.    Baseline: pt. Currently not exercising.   Goal status: INITIAL  PLAN:  PT FREQUENCY: 2x/week  PT DURATION: 6 weeks  PLANNED INTERVENTIONS: Therapeutic exercises, Therapeutic activity, Neuromuscular re-education, Balance training, Gait training, Patient/Family education, Self Care, Joint mobilization, Stair training, DME instructions, Aquatic Therapy, Cryotherapy, Traction, Manual therapy, and Re-evaluation  PLAN FOR NEXT SESSION: Progress strengthening/ discuss Chemo tx.    Cammie Mcgee, PT, DPT # [phone] 08/25/2022, 11:43 AM

## 2022-08-27 ENCOUNTER — Ambulatory Visit: Payer: Medicare Other

## 2022-08-27 ENCOUNTER — Encounter: Payer: Self-pay | Admitting: Physical Therapy

## 2022-08-27 ENCOUNTER — Ambulatory Visit: Payer: Medicare Other | Admitting: Physical Therapy

## 2022-08-27 DIAGNOSIS — R278 Other lack of coordination: Secondary | ICD-10-CM

## 2022-08-27 DIAGNOSIS — R262 Difficulty in walking, not elsewhere classified: Secondary | ICD-10-CM

## 2022-08-27 DIAGNOSIS — M6281 Muscle weakness (generalized): Secondary | ICD-10-CM

## 2022-08-27 DIAGNOSIS — M25552 Pain in left hip: Secondary | ICD-10-CM | POA: Diagnosis not present

## 2022-08-27 NOTE — Therapy (Signed)
OUTPATIENT PHYSICAL THERAPY LOWER EXTREMITY TREATMENT  Patient Name: [name] MRN: [id number] DOB:1945/07/16, 77 y.o., female Today's Date: 08/27/2022  END OF SESSION:  PT End of Session - 08/27/22 1346     Visit Number 8    Number of Visits 12    Date for PT Re-Evaluation 09/12/22    Authorization Type Medicare A & B, VL based on medical necessity    PT Start Time 1404    PT Stop Time 1500    PT Time Calculation (min) 56 min    Activity Tolerance Patient tolerated treatment well    Behavior During Therapy WFL for tasks assessed/performed            Past Medical History:  Diagnosis Date   CKD (chronic kidney disease), stage III    3b   Diabetes mellitus    DVT (deep venous thrombosis)    "In my 50's, after abdominal surgery"   Foot fracture, right    History of back surgery 1980   X2   History of colon polyps    History of kidney stones    Hyperlipidemia    Osteoporosis    Pneumonia    PONV (postoperative nausea and vomiting)    Sleep apnea    no CPAP   Vertigo    2 "bad" episodes.  most recent over 5 years ago   Vitamin D deficiency    Wears dentures    full upper   Past Surgical History:  Procedure Laterality Date   ABDOMINAL HYSTERECTOMY     total   APPENDECTOMY     CARDIAC CATHETERIZATION     [facility]    CARDIOVASCULAR STRESS TEST     abnormal, had cath which was normal   CARPAL TUNNEL RELEASE     CATARACT EXTRACTION W/PHACO Right 02/24/2022   Procedure: CATARACT EXTRACTION PHACO AND INTRAOCULAR LENS PLACEMENT (IOC) RIGHT DIABETIC HYDRUS MICROSTENT;  Surgeon: Nevada Crane, MD;  Location: [facility];  Service: Ophthalmology;  Laterality: Right;  Diabetic 4.18 00:44.9   CATARACT EXTRACTION W/PHACO Left 03/10/2022   Procedure: CATARACT EXTRACTION PHACO AND INTRAOCULAR LENS PLACEMENT (IOC) LEFT DIABETIC HYDRUS MICROSTENT 5.86 00:37.6;  Surgeon: Nevada Crane, MD;  Location: [facility];  Service:  Ophthalmology;  Laterality: Left;  Diabetic   CHOLECYSTECTOMY     COLECTOMY     for benign polyps   GLAUCOMA SURGERY     [facility]   KIDNEY STONE SURGERY     x3.  [facility]   Patient Active Problem List   Diagnosis Date Noted   Coronary artery disease due to lipid rich plaque 03/09/2015   DJD (degenerative joint disease), cervical 08/15/2014   Right shoulder pain 07/03/2014   Hypomagnesemia 03/13/2014   Nephrolithiasis 10/10/2013   Medicare annual wellness visit, subsequent 03/10/2013   Postmenopausal estrogen deficiency 03/10/2013   Hyperlipidemia 02/21/2013   Diabetes mellitus type 2 in obese (HCC) 12/06/2012   Right hip pain 09/06/2012   Morbid obesity 01/09/2011   Osteopenia 12/25/2010   PCP: Cameron Ali, MD  REFERRING PROVIDER: Cameron Ali, MD  REFERRING DIAG: Unilateral primary osteoarthritis, left hip   THERAPY DIAG:  Pain in left hip  Muscle weakness (generalized)  Difficulty in walking, not elsewhere classified  Other lack of coordination  Rationale for Evaluation and Treatment: Rehabilitation  ONSET DATE: 12/2021 (hip pain)  FROM THE INITIAL EVALUATION SUBJECTIVE:   SUBJECTIVE STATEMENT: Pt. Is currently off all meds except insulin.  7-8/10 L hip/ groin pain  at worst with walking.  Pt. Referred to PT to increase generalized strengthening and improve overall mobility prior to upcoming surgery.  Limited with walking due to pain/ fatigue.  Pt. Able to complete grocery shopping but limited.  No use of assistive device and no falls.    PERTINENT HISTORY:  CANCER HISTORY Extrahepatic Cholangiocarcinoma: - 6 weeks of generalized pruritus - Labs notable for transaminitis and elevated alk phos - 07/20/22: MRCP with severe intra and extrahepatic biliary dilatation extending to the distal CBD - 07/24/22: EUS with normal appearing pancreas, ERCP with structure in the lower third of the main bile duct, bile duct biopsy showing poorly differentiated  adenocarcinoma, stent placed ____________________________________________________________________  ASSESSMENT 1. 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 Weight Loss 3. Nausea 4. Left Hip Pain limiting mobility  RECOMMENDATIONS 1. CT Chest (non contrast d/t allergy) 2. CEA, CA 19-9, CMP, CBC 3. Scheduled to see surgery 4. Anticipate next follow up with med onc after surgery to discuss adjuvant therapy 5. Start work with physical therapy, patient already has referral  DISCUSSION We reviewed her diagnostic work up thus far which shows a localized extrahepatic cholangiocarcinoma. Imaging does not show evidence of nodal disease. We will obtain CT chest to complete staging. As this appears to be a resectable cancer we would generally favor upfront surgery most likely followed by adjuvant capecitabine. She has had significant physical decline recently and her mobility has been especially limited due to her left hip. She was evaluated by an orthopedic surgeon recently but now cancer diagnosis is interrupted this workup. Given the extensive surgery required for resection of her tumor we did discuss the possibility of neoadjuvant chemotherapy and prehabilitation if she is not deemed a current surgical candidate.  This patient was seen and discussed with Dr. Forbes Cellar.  [name], MD Hematology / Oncology Fellow  ______________________________________________________________________ HISTORY  [name] is seen today at the [facility] for consultation regarding extrahepatic cholangiocarcinoma at the request of Dr. Amada Jupiter.  She initially presented with over a month of diffuse pruritus. This prompted evaluation in clinic with labs revealing transaminitis and elevated alkaline phosphatase. Subsequent imaging showed narrowing of common bile duct. ERCP with biopsy of distal bile duct revealed  adenocarcinoma.  Over the last few months she has really been limited by left hip pain. She was evaluated by an orthopedic surgeon with plan for a steroid injection but this has been delayed with new cancer diagnosis. She remains independent in ADLs and lives alone. She is now needing assistance with IADLs. For example when she goes grocery shopping she normally sits in the car while her children shop. She denies any chest pain. She does have dyspnea with exertion. No issues with constipation or diarrhea. She has been having some persistent mild nausea without vomiting. Her p.o. intake is also decreased recently. She denies any significant abdominal pain following biliary stent placement.  MEDICAL HISTORY 1. CAD (no prior PCI or surgery) 2. HTN 3. T2DM c/b polyneuropathy, on insulin, follows with endocrinology 4. CKD - G3bA3, follows with nephrology 5. Nephrolithiasis 6. HLD - on rosuvastatin 7. DVT 8. IgG Kappa MGUS  Allergies Allergen Reactions Albuterol Shortness Of Breath Iodine And Iodide Containing Products Hives Metrizamide Swelling Other Swelling Uncoded Allergy. Allergen: SHELLFISH, Other Reaction: OTHER REACTION Tramadol Nausea And Vomiting, Shortness Of Breath and Nausea Only Chest pain Sulfur Other (See Comments) Iodinated Contrast Media Ioxaglate Sodium Metoprolol Other (See Comments) SEVERE BRADYCARDIA  IN 30'S UNKNOWN IF IT WAS A TRUE HEART BLCOK Statins-Hmg-Coa Reductase Inhibitors Other (See Comments) Muscle aches and severe fatigue Sulfasalazine Buprenorphine Rash Losartan Nausea Only Nausea, lightheaded Sulfa (Sulfonamide Antibiotics) Rash and Other (See Comments) Trospium Itching  Medications reviewed in the EMR  FAMILY HISTORY Brother thyroid cancer Sister breast cancer  SOCIAL HISTORY Lives alone. Here with son and daughter. Son is a Engineer, civil (consulting) at [employer] surgical clinic. No tobacco, alcohol, or drug use.  REVIEW OF SYSTEMS The remainder of a comprehensive 10  systems review is negative.  PHYSICAL EXAM BP 156/64  Pulse 66  Temp 36.7 C (98 F) (Temporal)  Resp 18  Ht 156.5 cm (5' 1.61")  Wt 97 kg (213 lb 12.8 oz)  SpO2 100%  BMI 39.60 kg/m GENERAL: well developed, well nourished, in no distress PSYCH: full and appropriate range of affect with good insight and judgement HEENT: NCAT, pupils equal, sclerae anicteric, OP clear without erythema, exudate or ulceration; neck supple without thyromegaly or masses; LYMPH: no cervical, supraclavicular, periumbilical adenopathy PULM: normal resp rate, clear bilaterally without wheezes, rhonchi, rales COR: regular without murmur, rub, gallop GI: abdomen soft, nontender, nondistended, no palpable hepatosplenomegaly EXT: warm and well perfused, no edema SKIN: No rashes  OBJECTIVE DATA LABS 07/16/22: transaminitis, normal bili, elevated alk phos and GGT, Cr 1.87, lipase 160 07/12/22: CBC without cytopenias  PATHOLOGY: reviewed  RADIOLOGY RESULTS (scans are personally reviewed by me and my impression) MRI Abd/MRCP 07/20/22: - biliary dilatation with obstruction in distal CBD, normal appearing liver, GB surgically absent, pancrease with multiple sub centimeter cystic lesions in the hear that 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 We discussed that even small tumors can spread so we do usually do adjuvant therapy. We do not have evidenec that neoadjuvant treatment offers much benefit vs adjuvant therapy, though is certainly something we consider in the setting of preoperative evidence of bulky or high risk nodal disease. I am a little concerned about her conditioning for surgery, so will discuss with multid team after she had spoken with our surgical teams. I saw and evaluated the patient, participating in the key portions of the service. I reviewed the resident's note. I agree  with the resident's findings and plan. I have personally reviewed the patient's medical records and imaging. Kathi Ludwig, MD   PAIN:  Are you having pain? Yes: NPRS scale: 4-5/10 Pain location: L hip Pain description: aching Aggravating factors: walking Relieving factors: resting/ "getting off the hip"  PRECAUTIONS: None  WEIGHT BEARING RESTRICTIONS: No  FALLS:  Has patient fallen in last 6 months? No  LIVING ENVIRONMENT: Lives with: lives alone  Pt. Has good family support that lives local.   Lives in: House/apartment Stairs: No Has following equipment at home: Ramped entry  OCCUPATION: Retired  PLOF: Independent   PATIENT GOALS: Increase muscle strength/ improve pain-free mobility and conditioning.  NEXT MD VISIT: 08/11/22 (meeting with surgeon)   OBJECTIVE:   DIAGNOSTIC FINDINGS: EXAM:  MR OF THE LEFT HIP WITHOUT CONTRAST   TECHNIQUE:  Multiplanar, multisequence MR imaging was performed. No intravenous  contrast was administered.   COMPARISON:  None Available.   FINDINGS:  Bones:   No hip fracture, dislocation or avascular necrosis.   No periosteal reaction or bone destruction. No aggressive osseous  lesion.   Normal sacrum and sacroiliac joints. No SI joint widening or erosive  changes.   Degenerative disease  with disc height loss and facet arthropathy at  L3-4, L4-5 and L5-S1.   Mild subchondral bone marrow edema on either side of the pubic  symphysis as can be seen with mild osteitis pubis.   Articular cartilage and labrum   Articular cartilage: Partial-thickness cartilage loss of the femoral  head and acetabulum bilaterally.   Labrum:  Left superior labral degeneration.   Joint or bursal effusion   Joint effusion:  No hip joint effusion.  No SI joint effusion.   Bursae:  No bursal fluid.   Muscles and tendons   Flexors: Normal.   Extensors: Normal.   Abductors: Normal.   Adductors: Normal.   Gluteals: Normal.   Hamstrings:  Normal.   Other findings   No pelvic free fluid. No fluid collection or hematoma. No inguinal  lymphadenopathy. No inguinal hernia.   IMPRESSION:  1. No hip fracture, dislocation or avascular necrosis.  2. Mild osteoarthritis of bilateral hips.  3. Left superior labral degeneration.  4. Degenerative disease with disc height loss and facet arthropathy  at L3-4, L4-5 and L5-S1.  5. Mild subchondral bone marrow edema on either side of the pubic  symphysis as can be seen with mild osteitis pubis.    On: 07/07/2022 04:54   PATIENT SURVEYS:  FOTO initial 50/ goal 65  COGNITION: Overall cognitive status: Within functional limits for tasks assessed     SENSATION: WFL  EDEMA:  NT  MUSCLE LENGTH: Hamstrings: Right NT deg; Left NT deg Maisie Fus test: Right NT deg; Left NT deg  POSTURE: rounded shoulders and forward head  PALPATION: Generalized L hip tenderness.    LOWER/ UPPER EXTREMITY ROM: B UE AROM WFL (no pain in shoulder)- h/o R shoulder surgery in past B LE AROM WFL (B hip flexion limited to 95 deg./ B hip abduction 30 deg.).    LOWER EXTREMITY MMT:  MMT Right eval Left eval  Hip flexion 4 4-  Hip extension    Hip abduction 4 3+  Hip adduction 4 4  Hip internal rotation    Hip external rotation    Knee flexion 5 5  Knee extension 5 5  Ankle dorsiflexion    Ankle plantarflexion    Ankle inversion    Ankle eversion     (Blank rows = not tested)  B shoulder flexion 4/5 MMT, biceps 5/5 MMT, triceps 4+/5 MMT  LOWER EXTREMITY SPECIAL TESTS:  Hip special tests: Luisa Hart (FABER) test: positive  on L, negative on R.   FUNCTIONAL TESTS:  5 times sit to stand: 17.06 sec./ 16.10 sec.  HR: 88 bpm, O2 sat. 100% 3 minute walk test: 402 feet (increase L hip pain to 4/10)- HR 99 bpm., O2 sat. 99%  Marked fatigue.  Pt. Unable to complete 6 MWT today.    GAIT: Distance walked: in clinic Assistive device utilized: None Level of assistance: Complete Independence Comments:  L antalgic gait with occasional shuffling of feet/ limited heel strike.  Cuing to increase hip flexion/ step pattern/ heel strike while walking in hallway.  Increase in L hip pain after 1 minute of walking in hallway.  Discussed use of SPC.  Tandem stance <10 sec./ difficulty with tandem gait and requires light UE assist in //-bars for safety.    :  963 feet with marked increase in L hip pain to 7/10 at the 3 minute 50 sec. Mark.  HRL 122 bpm/ 98% O2 sat.       TODAY'S TREATMENT:  DATE: 08/27/2022  SUBJECTIVE: Pt. C/o 5/10 L hip pain walking into PT clinic this morning.  Lots of walking reported over past 2 days.  Pt. Starts Chemo at [facility] tomorrow.    PAIN: L hip pain (chronic)- 5/10 prior to tx.         There.ex.   Nustep L4 x 16 min. B UE/LE (0.68 miles) for LE strengthening and cardiovascular conditioning.  No charge  Forward/ lateral walking L/R in //-bars with no UE assist.  L hip discomfort (4/10)- persistent with wt. Bearing activities.    6" forward step touches in //-bars (10x2) on L/R.  Light to no UE assist needed.  No rest breaks but increase UE assist with increase 2nd set.   6" step ups on L/R 10x each with light to no UE assist.  More difficulty with L hip.    Standing exercises in // bars with BUE support and no ankle weights today: Hip flexion marches x 20 BLE; Hip abduction x 20 BLE;  Hamstring curls x 20 BLE; Hip extension x 20 BLE; Seated LAQ x 20 B LE.        Walking around building 1x after tx. (increase L hip pain 6/10)- no standing rest breaks.   Sit to stand 10x with no UE assist/ added shoulder flexion with standing   Reviewed HEP.   PATIENT EDUCATION:  Education details: Pt educated throughout session about proper posture and technique with exercises. Improved exercise technique, movement at target joints, use of target muscles  after min to mod verbal, visual, tactile cues. HEP; Person educated: Patient and Child(ren) Education method: Medical illustrator and handout Education comprehension: verbalized understanding and returned demonstration   HOME EXERCISE PROGRAM: Access Code: [email or web address] URL: [email or web address] Date: 08/04/2022 Prepared by: Ria Comment  Exercises - Sit to Stand Without Arm Support  - 1 x daily - 7 x weekly - 2 sets - 10 reps - Standing March with Counter Support  - 1 x daily - 7 x weekly - 2 sets - 10 reps - Standing Hip Abduction with Unilateral Counter Support  - 1 x daily - 7 x weekly - 2 sets - 10 reps - Seated Hip Abduction with Resistance  - 1 x daily - 7 x weekly - 2 sets - 10 reps - Heel Raises with Counter Support  - 1 x daily - 7 x weekly - 2 sets - 10 reps   ASSESSMENT:  CLINICAL IMPRESSION: Pt. is able to complete all exercises as instructed with increase in L hip pain and intermittent seated rest breaks.  Pt. Highly motivated and works hard with PT tx. Session.  Pt. Able to walk around PT clinic with consistent gait pattern and no LOB.   Pt. Understands the importance to strengthen/ improve endurance during chemo tx.  Pt. Instructed to contact PT if any changes in tx. POC per MD/ chemo tx. Sessions.  Pt. Referred to PT to develop ex. Program to improve pain-free mobility/ function prior to upcoming surgery for cancer.  Pt. Motivated and will benefit from skilled PT services to increase LE muscle strength/ functional mobility to prepare for surgery.    OBJECTIVE IMPAIRMENTS: Abnormal gait, decreased activity tolerance, decreased balance, decreased endurance, decreased mobility, difficulty walking, decreased ROM, decreased strength, hypomobility, impaired flexibility, improper body mechanics, postural dysfunction, obesity, and pain.   ACTIVITY LIMITATIONS: carrying, bending, standing, stairs, transfers, and locomotion level  PARTICIPATION LIMITATIONS:  meal prep, cleaning, shopping, and community activity  PERSONAL FACTORS: Fitness and Past/current  experiences are also affecting patient's functional outcome.   REHAB POTENTIAL: Good  CLINICAL DECISION MAKING: Evolving/moderate complexity  EVALUATION COMPLEXITY: Moderate   GOALS: Goals reviewed with patient? Yes  SHORT TERM GOALS: Target date: 08/22/22 Pt. Will be independent with HEP to increase hip strength 1/2 muscle grade to improve standing/ walking tolerance.   Baseline: Goal status: Not met   LONG TERM GOALS: Target date: 09/12/22  Pt. Will increase FOTO to 65 to improve pain-free mobility.   Baseline: initial 50 Goal status: INITIAL  2.  Pt. Will complete 5xSTS to <12 seconds to improve functional mobility.   Baseline: see above Goal status: INITIAL  3.  Pt. Able to complete 30 minutes of walking/ exercise with no increase L hip pain to improve generalized conditioning/ daily mobility.    Baseline: pt. Currently not exercising.   Goal status: INITIAL  PLAN:  PT FREQUENCY: 2x/week  PT DURATION: 6 weeks  PLANNED INTERVENTIONS: Therapeutic exercises, Therapeutic activity, Neuromuscular re-education, Balance training, Gait training, Patient/Family education, Self Care, Joint mobilization, Stair training, DME instructions, Aquatic Therapy, Cryotherapy, Traction, Manual therapy, and Re-evaluation  PLAN FOR NEXT SESSION: Progress strengthening/ discuss Chemo tx.  CHECK 5xSTS and FOTO.    Cammie Mcgee, PT, DPT # [phone] 08/27/2022, 8:46 PM

## 2022-09-01 ENCOUNTER — Encounter: Payer: Medicare Other | Admitting: Physical Therapy

## 2022-09-02 ENCOUNTER — Ambulatory Visit: Payer: Medicare Other

## 2022-09-04 ENCOUNTER — Ambulatory Visit: Payer: Medicare Other

## 2022-09-04 DIAGNOSIS — M25552 Pain in left hip: Secondary | ICD-10-CM | POA: Diagnosis not present

## 2022-09-04 DIAGNOSIS — R278 Other lack of coordination: Secondary | ICD-10-CM

## 2022-09-04 DIAGNOSIS — M6281 Muscle weakness (generalized): Secondary | ICD-10-CM

## 2022-09-04 DIAGNOSIS — R262 Difficulty in walking, not elsewhere classified: Secondary | ICD-10-CM

## 2022-09-04 NOTE — Therapy (Signed)
OUTPATIENT PHYSICAL THERAPY LOWER EXTREMITY TREATMENT  Patient Name: [name] MRN: [id number] DOB:06/07/1945, 77 y.o., female Today's Date: 09/04/2022  END OF SESSION:  PT End of Session - 09/04/22 1304     Visit Number 9    Number of Visits 12    Date for PT Re-Evaluation 09/12/22    Authorization Type Medicare A & B, VL based on medical necessity    PT Start Time 1146    PT Stop Time 1240    PT Time Calculation (min) 54 min    Activity Tolerance Patient tolerated treatment well    Behavior During Therapy WFL for tasks assessed/performed             Past Medical History:  Diagnosis Date   CKD (chronic kidney disease), stage III    3b   Diabetes mellitus    DVT (deep venous thrombosis)    "In my 50's, after abdominal surgery"   Foot fracture, right    History of back surgery 1980   X2   History of colon polyps    History of kidney stones    Hyperlipidemia    Osteoporosis    Pneumonia    PONV (postoperative nausea and vomiting)    Sleep apnea    no CPAP   Vertigo    2 "bad" episodes.  most recent over 5 years ago   Vitamin D deficiency    Wears dentures    full upper   Past Surgical History:  Procedure Laterality Date   ABDOMINAL HYSTERECTOMY     total   APPENDECTOMY     CARDIAC CATHETERIZATION     [facility]    CARDIOVASCULAR STRESS TEST     abnormal, had cath which was normal   CARPAL TUNNEL RELEASE     CATARACT EXTRACTION W/PHACO Right 02/24/2022   Procedure: CATARACT EXTRACTION PHACO AND INTRAOCULAR LENS PLACEMENT (IOC) RIGHT DIABETIC HYDRUS MICROSTENT;  Surgeon: Brenda Crane, MD;  Location: [facility];  Service: Ophthalmology;  Laterality: Right;  Diabetic 4.18 00:44.9   CATARACT EXTRACTION W/PHACO Left 03/10/2022   Procedure: CATARACT EXTRACTION PHACO AND INTRAOCULAR LENS PLACEMENT (IOC) LEFT DIABETIC HYDRUS MICROSTENT 5.86 00:37.6;  Surgeon: Brenda Crane, MD;  Location: [facility];  Service:  Ophthalmology;  Laterality: Left;  Diabetic   CHOLECYSTECTOMY     COLECTOMY     for benign polyps   GLAUCOMA SURGERY     [facility]   KIDNEY STONE SURGERY     x3.  [facility]   Patient Active Problem List   Diagnosis Date Noted   Coronary artery disease due to lipid rich plaque 03/09/2015   DJD (degenerative joint disease), cervical 08/15/2014   Right shoulder pain 07/03/2014   Hypomagnesemia 03/13/2014   Nephrolithiasis 10/10/2013   Medicare annual wellness visit, subsequent 03/10/2013   Postmenopausal estrogen deficiency 03/10/2013   Hyperlipidemia 02/21/2013   Diabetes mellitus type 2 in obese (HCC) 12/06/2012   Right hip pain 09/06/2012   Morbid obesity 01/09/2011   Osteopenia 12/25/2010   PCP: Brenda Ali, MD  REFERRING PROVIDER: Cameron Ali, MD  REFERRING DIAG: Unilateral primary osteoarthritis, left hip   THERAPY DIAG:  Pain in left hip  Muscle weakness (generalized)  Difficulty in walking, not elsewhere classified  Other lack of coordination  Rationale for Evaluation and Treatment: Rehabilitation  ONSET DATE: 12/2021 (hip pain)  FROM THE INITIAL EVALUATION SUBJECTIVE:   SUBJECTIVE STATEMENT: Pt. Is currently off all meds except insulin.  7-8/10 L hip/ groin  pain at worst with walking.  Pt. Referred to PT to increase generalized strengthening and improve overall mobility prior to upcoming surgery.  Limited with walking due to pain/ fatigue.  Pt. Able to complete grocery shopping but limited.  No use of assistive device and no falls.    PERTINENT HISTORY:  CANCER HISTORY Extrahepatic Cholangiocarcinoma: - 6 weeks of generalized pruritus - Labs notable for transaminitis and elevated alk phos - 07/20/22: MRCP with severe intra and extrahepatic biliary dilatation extending to the distal CBD - 07/24/22: EUS with normal appearing pancreas, ERCP with structure in the lower third of the main bile duct, bile duct biopsy showing poorly differentiated  adenocarcinoma, stent placed ____________________________________________________________________  ASSESSMENT 1. 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 Weight Loss 3. Nausea 4. Left Hip Pain limiting mobility  RECOMMENDATIONS 1. CT Chest (non contrast d/t allergy) 2. CEA, CA 19-9, CMP, CBC 3. Scheduled to see surgery 4. Anticipate next follow up with med onc after surgery to discuss adjuvant therapy 5. Start work with physical therapy, patient already has referral  DISCUSSION We reviewed her diagnostic work up thus far which shows a localized extrahepatic cholangiocarcinoma. Imaging does not show evidence of nodal disease. We will obtain CT chest to complete staging. As this appears to be a resectable cancer we would generally favor upfront surgery most likely followed by adjuvant capecitabine. She has had significant physical decline recently and her mobility has been especially limited due to her left hip. She was evaluated by an orthopedic surgeon recently but now cancer diagnosis is interrupted this workup. Given the extensive surgery required for resection of her tumor we did discuss the possibility of neoadjuvant chemotherapy and prehabilitation if she is not deemed a current surgical candidate.  This patient was seen and discussed with [name].  Brenda Laine, MD Hematology / Oncology Fellow  ______________________________________________________________________ HISTORY  [name] is seen today at the [facility] for consultation regarding extrahepatic cholangiocarcinoma at the request of [name].  She initially presented with over a month of diffuse pruritus. This prompted evaluation in clinic with labs revealing transaminitis and elevated alkaline phosphatase. Subsequent imaging showed narrowing of common bile duct. ERCP with biopsy of distal bile duct revealed  adenocarcinoma.  Over the last few months she has really been limited by left hip pain. She was evaluated by an orthopedic surgeon with plan for a steroid injection but this has been delayed with new cancer diagnosis. She remains independent in ADLs and lives alone. She is now needing assistance with IADLs. For example when she goes grocery shopping she normally sits in the car while her children shop. She denies any chest pain. She does have dyspnea with exertion. No issues with constipation or diarrhea. She has been having some persistent mild nausea without vomiting. Her p.o. intake is also decreased recently. She denies any significant abdominal pain following biliary stent placement.  MEDICAL HISTORY 1. CAD (no prior PCI or surgery) 2. HTN 3. T2DM c/b polyneuropathy, on insulin, follows with endocrinology 4. CKD - G3bA3, follows with nephrology 5. Nephrolithiasis 6. HLD - on rosuvastatin 7. DVT 8. IgG Kappa MGUS  Allergies Allergen Reactions Albuterol Shortness Of Breath Iodine And Iodide Containing Products Hives Metrizamide Swelling Other Swelling Uncoded Allergy. Allergen: SHELLFISH, Other Reaction: OTHER REACTION Tramadol Nausea And Vomiting, Shortness Of Breath and Nausea Only Chest pain Sulfur Other (See Comments) Iodinated Contrast Media Ioxaglate Sodium Metoprolol Other (See Comments) SEVERE  BRADYCARDIA IN 30'S UNKNOWN IF IT WAS A TRUE HEART BLCOK Statins-Hmg-Coa Reductase Inhibitors Other (See Comments) Muscle aches and severe fatigue Sulfasalazine Buprenorphine Rash Losartan Nausea Only Nausea, lightheaded Sulfa (Sulfonamide Antibiotics) Rash and Other (See Comments) Trospium Itching  Medications reviewed in the EMR  FAMILY HISTORY Brother thyroid cancer Sister breast cancer  SOCIAL HISTORY Lives alone. Here with son and daughter. Son is a Marine scientist at [employer] surgical clinic. No tobacco, alcohol, or drug use.  REVIEW OF SYSTEMS The remainder of a comprehensive 10  systems review is negative.  PHYSICAL EXAM BP 156/64  Pulse 66  Temp 36.7 C (98 F) (Temporal)  Resp 18  Ht 156.5 cm (5' 1.61")  Wt 97 kg (213 lb 12.8 oz)  SpO2 100%  BMI 39.60 kg/m GENERAL: well developed, well nourished, in no distress PSYCH: full and appropriate range of affect with good insight and judgement HEENT: NCAT, pupils equal, sclerae anicteric, OP clear without erythema, exudate or ulceration; neck supple without thyromegaly or masses; LYMPH: no cervical, supraclavicular, periumbilical adenopathy PULM: normal resp rate, clear bilaterally without wheezes, rhonchi, rales COR: regular without murmur, rub, gallop GI: abdomen soft, nontender, nondistended, no palpable hepatosplenomegaly EXT: warm and well perfused, no edema SKIN: No rashes  OBJECTIVE DATA LABS 07/16/22: transaminitis, normal bili, elevated alk phos and GGT, Cr 1.87, lipase 160 07/12/22: CBC without cytopenias  PATHOLOGY: reviewed  RADIOLOGY RESULTS (scans are personally reviewed by me and my impression) MRI Abd/MRCP 07/20/22: - biliary dilatation with obstruction in distal CBD, normal appearing liver, GB surgically absent, pancrease with multiple sub centimeter cystic lesions in the hear that 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 We discussed that even small tumors can spread so we do usually do adjuvant therapy. We do not have evidenec that neoadjuvant treatment offers much benefit vs adjuvant therapy, though is certainly something we consider in the setting of preoperative evidence of bulky or high risk nodal disease. I am a little concerned about her conditioning for surgery, so will discuss with multid team after she had spoken with our surgical teams. I saw and evaluated the patient, participating in the key portions of the service. I reviewed the resident's note. I agree  with the resident's findings and plan. I have personally reviewed the patient's medical records and imaging. Ulice Dash, MD   PAIN:  Are you having pain? Yes: NPRS scale: 4-5/10 Pain location: L hip Pain description: aching Aggravating factors: walking Relieving factors: resting/ "getting off the hip"  PRECAUTIONS: None  WEIGHT BEARING RESTRICTIONS: No  FALLS:  Has patient fallen in last 6 months? No  LIVING ENVIRONMENT: Lives with: lives alone  Pt. Has good family support that lives local.   Lives in: House/apartment Stairs: No Has following equipment at home: Ramped entry  OCCUPATION: Retired  PLOF: Independent   PATIENT GOALS: Increase muscle strength/ improve pain-free mobility and conditioning.  NEXT MD VISIT: 08/11/22 (meeting with surgeon)   OBJECTIVE:   DIAGNOSTIC FINDINGS: EXAM:  MR OF THE LEFT HIP WITHOUT CONTRAST   TECHNIQUE:  Multiplanar, multisequence MR imaging was performed. No intravenous  contrast was administered.   COMPARISON:  None Available.   FINDINGS:  Bones:   No hip fracture, dislocation or avascular necrosis.   No periosteal reaction or bone destruction. No aggressive osseous  lesion.   Normal sacrum and sacroiliac joints. No SI joint widening or erosive  changes.   Degenerative  disease with disc height loss and facet arthropathy at  L3-4, L4-5 and L5-S1.   Mild subchondral bone marrow edema on either side of the pubic  symphysis as can be seen with mild osteitis pubis.   Articular cartilage and labrum   Articular cartilage: Partial-thickness cartilage loss of the femoral  head and acetabulum bilaterally.   Labrum:  Left superior labral degeneration.   Joint or bursal effusion   Joint effusion:  No hip joint effusion.  No SI joint effusion.   Bursae:  No bursal fluid.   Muscles and tendons   Flexors: Normal.   Extensors: Normal.   Abductors: Normal.   Adductors: Normal.   Gluteals: Normal.   Hamstrings:  Normal.   Other findings   No pelvic free fluid. No fluid collection or hematoma. No inguinal  lymphadenopathy. No inguinal hernia.   IMPRESSION:  1. No hip fracture, dislocation or avascular necrosis.  2. Mild osteoarthritis of bilateral hips.  3. Left superior labral degeneration.  4. Degenerative disease with disc height loss and facet arthropathy  at L3-4, L4-5 and L5-S1.  5. Mild subchondral bone marrow edema on either side of the pubic  symphysis as can be seen with mild osteitis pubis.    On: 07/07/2022 04:54   PATIENT SURVEYS:  FOTO initial 50/ goal 65  COGNITION: Overall cognitive status: Within functional limits for tasks assessed     SENSATION: WFL  EDEMA:  NT  MUSCLE LENGTH: Hamstrings: Right NT deg; Left NT deg Maisie Fus test: Right NT deg; Left NT deg  POSTURE: rounded shoulders and forward head  PALPATION: Generalized L hip tenderness.    LOWER/ UPPER EXTREMITY ROM: B UE AROM WFL (no pain in shoulder)- h/o R shoulder surgery in past B LE AROM WFL (B hip flexion limited to 95 deg./ B hip abduction 30 deg.).    LOWER EXTREMITY MMT:  MMT Right eval Left eval  Hip flexion 4 4-  Hip extension    Hip abduction 4 3+  Hip adduction 4 4  Hip internal rotation    Hip external rotation    Knee flexion 5 5  Knee extension 5 5  Ankle dorsiflexion    Ankle plantarflexion    Ankle inversion    Ankle eversion     (Blank rows = not tested)  B shoulder flexion 4/5 MMT, biceps 5/5 MMT, triceps 4+/5 MMT  LOWER EXTREMITY SPECIAL TESTS:  Hip special tests: Luisa Hart (FABER) test: positive  on L, negative on R.   FUNCTIONAL TESTS:  5 times sit to stand: 17.06 sec./ 16.10 sec.  HR: 88 bpm, O2 sat. 100% 3 minute walk test: 402 feet (increase L hip pain to 4/10)- HR 99 bpm., O2 sat. 99%  Marked fatigue.  Pt. Unable to complete 6 MWT today.    GAIT: Distance walked: in clinic Assistive device utilized: None Level of assistance: Complete Independence Comments:  L antalgic gait with occasional shuffling of feet/ limited heel strike.  Cuing to increase hip flexion/ step pattern/ heel strike while walking in hallway.  Increase in L hip pain after 1 minute of walking in hallway.  Discussed use of SPC.  Tandem stance <10 sec./ difficulty with tandem gait and requires light UE assist in //-bars for safety.    :  963 feet with marked increase in L hip pain to 7/10 at the 3 minute 50 sec. Mark.  HRL 122 bpm/ 98% O2 sat.       TODAY'S TREATMENT:  DATE: 09/04/2022 54 mins  SUBJECTIVE: Pt. C/o 3/10 L hip pain walking into PT clinic this morning after Nu-Step 5/10 which subsided to 1/10 after L hip Jt mobs.   Lots of walking reported over past 2 days.  Pt. Starts Chemo at [facility] tomorrow.    PAIN: L hip pain (chronic)- 5/10 prior to tx.         There.ex.44 mins  Nustep L4 x 15 min. B UE/LE (0.68 miles) for LE strengthening and cardiovascular conditioning.  Quad and Iliopsoas stretch to L hip 2 x 60 secs SLR 2 x 30 secs Clam shell 2 x 60 secs Bridges 2 x 60 secs STS with ball squeeze 2 x 45 secs  Manul 10 mins Left hip distraction. Inf and lateral glides grade III/IV Measurement of Leg length discrepancies L>R by ~ 2 cm in supine.      performed today.  Forward/ lateral walking L/R in //-bars with no UE assist.  L hip discomfort (4/10)- persistent with wt. Bearing activities.    6" forward step touches in //-bars (10x2) on L/R.  Light to no UE assist needed.  No rest breaks but increase UE assist with increase 2nd set.   6" step ups on L/R 10x each with light to no UE assist.  More difficulty with L hip.    Standing exercises in // bars with BUE support and no ankle weights today: Hip flexion marches x 20 BLE; Hip abduction x 20 BLE;  Hamstring curls x 20 BLE; Hip extension x 20 BLE; Seated LAQ x 20 B LE.        Walking  around building 1x after tx. (increase L hip pain 6/10)- no standing rest breaks.   Sit to stand 10x with no UE assist/ added shoulder flexion with standing   Reviewed HEP.   PATIENT EDUCATION:  Education details: Pt educated throughout session about proper posture and technique with exercises. Improved exercise technique, movement at target joints, use of target muscles after min to mod verbal, visual, tactile cues. HEP; Person educated: Patient and Child(ren) Education method: Medical illustrator and handout Education comprehension: verbalized understanding and returned demonstration   HOME EXERCISE PROGRAM: Access Code: [email or web address] URL: [email or web address] Date: 08/04/2022 Prepared by: Ria Comment  Exercises - Sit to Stand Without Arm Support  - 1 x daily - 7 x weekly - 2 sets - 10 reps - Standing March with Counter Support  - 1 x daily - 7 x weekly - 2 sets - 10 reps - Standing Hip Abduction with Unilateral Counter Support  - 1 x daily - 7 x weekly - 2 sets - 10 reps - Seated Hip Abduction with Resistance  - 1 x daily - 7 x weekly - 2 sets - 10 reps - Heel Raises with Counter Support  - 1 x daily - 7 x weekly - 2 sets - 10 reps   ASSESSMENT:  CLINICAL IMPRESSION: Pt. Presents with increased fatigue and unsteadiness of BLE . Pt tires easily therefore frequent rest breaks through out the session. PT discussed detail about her conditions and the significance of walking as she is onto her Chemo treatment sessions. Pt benefited from Jt mobs evident by pain level and pain free ROM. PT found LLD L>R with ASIS to Medial Malleoli but more accurate measurement can be achieved with lifts. However, pt plans to purchase a 1/4 inch leg lift for trial and will bring it to the clinic next session to assess if the pt can benefit  from it and if it is the right correction. As pt advances her chemo treatment pt may be at higher risk of falling 2/2 to fatigue and LLD.    Pt.  Motivated and will benefit from skilled PT services to increase LE muscle strength/ functional mobility to prepare for surgery.    OBJECTIVE IMPAIRMENTS: Abnormal gait, decreased activity tolerance, decreased balance, decreased endurance, decreased mobility, difficulty walking, decreased ROM, decreased strength, hypomobility, impaired flexibility, improper body mechanics, postural dysfunction, obesity, and pain.   ACTIVITY LIMITATIONS: carrying, bending, standing, stairs, transfers, and locomotion level  PARTICIPATION LIMITATIONS: meal prep, cleaning, shopping, and community activity  PERSONAL FACTORS: Fitness and Past/current experiences are also affecting patient's functional outcome.   REHAB POTENTIAL: Good  CLINICAL DECISION MAKING: Evolving/moderate complexity  EVALUATION COMPLEXITY: Moderate   GOALS: Goals reviewed with patient? Yes  SHORT TERM GOALS: Target date: 08/22/22 Pt. Will be independent with HEP to increase hip strength 1/2 muscle grade to improve standing/ walking tolerance.   Baseline: Goal status: Not met   LONG TERM GOALS: Target date: 09/12/22  Pt. Will increase FOTO to 65 to improve pain-free mobility.   Baseline: initial 50 Goal status: INITIAL  2.  Pt. Will complete 5xSTS to <12 seconds to improve functional mobility.   Baseline: see above Goal status: INITIAL  3.  Pt. Able to complete 30 minutes of walking/ exercise with no increase L hip pain to improve generalized conditioning/ daily mobility.    Baseline: pt. Currently not exercising.   Goal status: INITIAL  PLAN:  PT FREQUENCY: 2x/week  PT DURATION: 6 weeks  PLANNED INTERVENTIONS: Therapeutic exercises, Therapeutic activity, Neuromuscular re-education, Balance training, Gait training, Patient/Family education, Self Care, Joint mobilization, Stair training, DME instructions, Aquatic Therapy, Cryotherapy, Traction, Manual therapy, and Re-evaluation  PLAN FOR NEXT SESSION: Progress strengthening/  discuss Chemo tx.  CHECK 5xSTS and FOTO.    Cammie Mcgee, PT, DPT # [phone] 09/04/2022, 2:05 PM

## 2022-09-08 ENCOUNTER — Ambulatory Visit: Payer: Medicare Other | Admitting: Physical Therapy

## 2022-09-08 ENCOUNTER — Encounter: Payer: Self-pay | Admitting: Physical Therapy

## 2022-09-08 DIAGNOSIS — M25552 Pain in left hip: Secondary | ICD-10-CM

## 2022-09-08 DIAGNOSIS — M6281 Muscle weakness (generalized): Secondary | ICD-10-CM

## 2022-09-08 DIAGNOSIS — R278 Other lack of coordination: Secondary | ICD-10-CM

## 2022-09-08 DIAGNOSIS — R262 Difficulty in walking, not elsewhere classified: Secondary | ICD-10-CM

## 2022-09-08 NOTE — Therapy (Signed)
OUTPATIENT PHYSICAL THERAPY LOWER EXTREMITY TREATMENT Physical Therapy Progress Note  Dates of reporting period  08/01/22   to   09/08/22   Patient Name: [name] MRN: [id number] DOB:23-Mar-1946, 77 y.o., female Today's Date: 09/08/2022  END OF SESSION:  PT End of Session - 09/08/22 1108     Visit Number 10    Number of Visits 12    Date for PT Re-Evaluation 09/12/22    Authorization Type Medicare A & B, VL based on medical necessity    PT Start Time 1108    Activity Tolerance Patient tolerated treatment well    Behavior During Therapy [facility] for tasks assessed/performed             Past Medical History:  Diagnosis Date   CKD (chronic kidney disease), stage III (HCC)    3b   Diabetes mellitus    DVT (deep venous thrombosis) (HCC)    "In my 50's, after abdominal surgery"   Foot fracture, right    History of back surgery 1980   X2   History of colon polyps    History of kidney stones    Hyperlipidemia    Osteoporosis    Pneumonia    PONV (postoperative nausea and vomiting)    Sleep apnea    no CPAP   Vertigo    2 "bad" episodes.  most recent over 5 years ago   Vitamin D deficiency    Wears dentures    full upper   Past Surgical History:  Procedure Laterality Date   ABDOMINAL HYSTERECTOMY     total   APPENDECTOMY     CARDIAC CATHETERIZATION     [facility]    CARDIOVASCULAR STRESS TEST     abnormal, had cath which was normal   CARPAL TUNNEL RELEASE     CATARACT EXTRACTION W/PHACO Right 02/24/2022   Procedure: CATARACT EXTRACTION PHACO AND INTRAOCULAR LENS PLACEMENT (IOC) RIGHT DIABETIC HYDRUS MICROSTENT;  Surgeon: Nevada Crane, MD;  Location: [facility];  Service: Ophthalmology;  Laterality: Right;  Diabetic 4.18 00:44.9   CATARACT EXTRACTION W/PHACO Left 03/10/2022   Procedure: CATARACT EXTRACTION PHACO AND INTRAOCULAR LENS PLACEMENT (IOC) LEFT DIABETIC HYDRUS MICROSTENT 5.86 00:37.6;  Surgeon: Nevada Crane, MD;  Location:  [facility];  Service: Ophthalmology;  Laterality: Left;  Diabetic   CHOLECYSTECTOMY     COLECTOMY     for benign polyps   GLAUCOMA SURGERY     [facility]   KIDNEY STONE SURGERY     x3.  [facility]   Patient Active Problem List   Diagnosis Date Noted   Coronary artery disease due to lipid rich plaque 03/09/2015   DJD (degenerative joint disease), cervical 08/15/2014   Right shoulder pain 07/03/2014   Hypomagnesemia 03/13/2014   Nephrolithiasis 10/10/2013   Medicare annual wellness visit, subsequent 03/10/2013   Postmenopausal estrogen deficiency 03/10/2013   Hyperlipidemia 02/21/2013   Diabetes mellitus type 2 in obese (HCC) 12/06/2012   Right hip pain 09/06/2012   Morbid obesity (HCC) 01/09/2011   Osteopenia 12/25/2010   PCP: Cameron Ali, MD  REFERRING PROVIDER: Cameron Ali, MD  REFERRING DIAG: Unilateral primary osteoarthritis, left hip   THERAPY DIAG:  Pain in left hip  Muscle weakness (generalized)  Difficulty in walking, not elsewhere classified  Other lack of coordination  Rationale for Evaluation and Treatment: Rehabilitation  ONSET DATE: 12/2021 (hip pain)  FROM THE INITIAL EVALUATION SUBJECTIVE:   SUBJECTIVE STATEMENT: Pt. Is currently off all meds except insulin.  7-8/10 L hip/ groin pain at worst with walking.  Pt. Referred to PT to increase generalized strengthening and improve overall mobility prior to upcoming surgery.  Limited with walking due to pain/ fatigue.  Pt. Able to complete grocery shopping but limited.  No use of assistive device and no falls.    PERTINENT HISTORY:  CANCER HISTORY Extrahepatic Cholangiocarcinoma: - 6 weeks of generalized pruritus - Labs notable for transaminitis and elevated alk phos - 07/20/22: MRCP with severe intra and extrahepatic biliary dilatation extending to the distal CBD - 07/24/22: EUS with normal appearing pancreas, ERCP with structure in the lower third of the main bile duct, bile duct biopsy  showing poorly differentiated adenocarcinoma, stent placed ____________________________________________________________________  ASSESSMENT 1. 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 Weight Loss 3. Nausea 4. Left Hip Pain limiting mobility  RECOMMENDATIONS 1. CT Chest (non contrast d/t allergy) 2. CEA, CA 19-9, CMP, CBC 3. Scheduled to see surgery 4. Anticipate next follow up with med onc after surgery to discuss adjuvant therapy 5. Start work with physical therapy, patient already has referral  DISCUSSION We reviewed her diagnostic work up thus far which shows a localized extrahepatic cholangiocarcinoma. Imaging does not show evidence of nodal disease. We will obtain CT chest to complete staging. As this appears to be a resectable cancer we would generally favor upfront surgery most likely followed by adjuvant capecitabine. She has had significant physical decline recently and her mobility has been especially limited due to her left hip. She was evaluated by an orthopedic surgeon recently but now cancer diagnosis is interrupted this workup. Given the extensive surgery required for resection of her tumor we did discuss the possibility of neoadjuvant chemotherapy and prehabilitation if she is not deemed a current surgical candidate.  This patient was seen and discussed with Dr. Forbes Cellar.  [name], MD Hematology / Oncology Fellow  ______________________________________________________________________ HISTORY  [name] is seen today at the [facility] for consultation regarding extrahepatic cholangiocarcinoma at the request of Dr. Amada Jupiter.  She initially presented with over a month of diffuse pruritus. This prompted evaluation in clinic with labs revealing transaminitis and elevated alkaline phosphatase. Subsequent imaging showed narrowing of common bile duct. ERCP with biopsy of  distal bile duct revealed adenocarcinoma.  Over the last few months she has really been limited by left hip pain. She was evaluated by an orthopedic surgeon with plan for a steroid injection but this has been delayed with new cancer diagnosis. She remains independent in ADLs and lives alone. She is now needing assistance with IADLs. For example when she goes grocery shopping she normally sits in the car while her children shop. She denies any chest pain. She does have dyspnea with exertion. No issues with constipation or diarrhea. She has been having some persistent mild nausea without vomiting. Her p.o. intake is also decreased recently. She denies any significant abdominal pain following biliary stent placement.  MEDICAL HISTORY 1. CAD (no prior PCI or surgery) 2. HTN 3. T2DM c/b polyneuropathy, on insulin, follows with endocrinology 4. CKD - G3bA3, follows with nephrology 5. Nephrolithiasis 6. HLD - on rosuvastatin 7. DVT 8. IgG Kappa MGUS  Allergies Allergen Reactions Albuterol Shortness Of Breath Iodine And Iodide Containing Products Hives Metrizamide Swelling Other Swelling Uncoded Allergy. Allergen: SHELLFISH, Other Reaction: OTHER REACTION Tramadol Nausea And Vomiting, Shortness Of Breath and Nausea Only Chest pain Sulfur Other (See Comments) Iodinated Contrast Media Ioxaglate Sodium Metoprolol  Other (See Comments) SEVERE BRADYCARDIA IN 30'S UNKNOWN IF IT WAS A TRUE HEART BLCOK Statins-Hmg-Coa Reductase Inhibitors Other (See Comments) Muscle aches and severe fatigue Sulfasalazine Buprenorphine Rash Losartan Nausea Only Nausea, lightheaded Sulfa (Sulfonamide Antibiotics) Rash and Other (See Comments) Trospium Itching  Medications reviewed in the EMR  FAMILY HISTORY Brother thyroid cancer Sister breast cancer  SOCIAL HISTORY Lives alone. Here with son and daughter. Son is a Engineer, civil (consulting) at [employer] surgical clinic. No tobacco, alcohol, or drug use.  REVIEW OF SYSTEMS The  remainder of a comprehensive 10 systems review is negative.  PHYSICAL EXAM BP 156/64  Pulse 66  Temp 36.7 C (98 F) (Temporal)  Resp 18  Ht 156.5 cm (5' 1.61")  Wt 97 kg (213 lb 12.8 oz)  SpO2 100%  BMI 39.60 kg/m GENERAL: well developed, well nourished, in no distress PSYCH: full and appropriate range of affect with good insight and judgement HEENT: NCAT, pupils equal, sclerae anicteric, OP clear without erythema, exudate or ulceration; neck supple without thyromegaly or masses; LYMPH: no cervical, supraclavicular, periumbilical adenopathy PULM: normal resp rate, clear bilaterally without wheezes, rhonchi, rales COR: regular without murmur, rub, gallop GI: abdomen soft, nontender, nondistended, no palpable hepatosplenomegaly EXT: warm and well perfused, no edema SKIN: No rashes  OBJECTIVE DATA LABS 07/16/22: transaminitis, normal bili, elevated alk phos and GGT, Cr 1.87, lipase 160 07/12/22: CBC without cytopenias  PATHOLOGY: reviewed  RADIOLOGY RESULTS (scans are personally reviewed by me and my impression) MRI Abd/MRCP 07/20/22: - biliary dilatation with obstruction in distal CBD, normal appearing liver, GB surgically absent, pancrease with multiple sub centimeter cystic lesions in the hear that 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 We discussed that even small tumors can spread so we do usually do adjuvant therapy. We do not have evidenec that neoadjuvant treatment offers much benefit vs adjuvant therapy, though is certainly something we consider in the setting of preoperative evidence of bulky or high risk nodal disease. I am a little concerned about her conditioning for surgery, so will discuss with multid team after she had spoken with our surgical teams. I saw and evaluated the patient, participating in the key portions of the service. I  reviewed the resident's note. I agree with the resident's findings and plan. I have personally reviewed the patient's medical records and imaging. Kathi Ludwig, MD   PAIN:  Are you having pain? Yes: NPRS scale: 4-5/10 Pain location: L hip Pain description: aching Aggravating factors: walking Relieving factors: resting/ "getting off the hip"  PRECAUTIONS: None  WEIGHT BEARING RESTRICTIONS: No  FALLS:  Has patient fallen in last 6 months? No  LIVING ENVIRONMENT: Lives with: lives alone  Pt. Has good family support that lives local.   Lives in: House/apartment Stairs: No Has following equipment at home: Ramped entry  OCCUPATION: Retired  PLOF: Independent   PATIENT GOALS: Increase muscle strength/ improve pain-free mobility and conditioning.  NEXT MD VISIT: 08/11/22 (meeting with surgeon)   OBJECTIVE:   DIAGNOSTIC FINDINGS: EXAM:  MR OF THE LEFT HIP WITHOUT CONTRAST   TECHNIQUE:  Multiplanar, multisequence MR imaging was performed. No intravenous  contrast was administered.   COMPARISON:  None Available.   FINDINGS:  Bones:   No hip fracture, dislocation or avascular necrosis.   No periosteal reaction or bone destruction. No aggressive osseous  lesion.   Normal sacrum and sacroiliac joints. No SI joint widening or erosive  changes.   Degenerative disease with disc height loss and facet arthropathy at  L3-4, L4-5 and L5-S1.   Mild subchondral bone marrow edema on either side of the pubic  symphysis as can be seen with mild osteitis pubis.   Articular cartilage and labrum   Articular cartilage: Partial-thickness cartilage loss of the femoral  head and acetabulum bilaterally.   Labrum:  Left superior labral degeneration.   Joint or bursal effusion   Joint effusion:  No hip joint effusion.  No SI joint effusion.   Bursae:  No bursal fluid.   Muscles and tendons   Flexors: Normal.   Extensors: Normal.   Abductors: Normal.   Adductors: Normal.    Gluteals: Normal.   Hamstrings: Normal.   Other findings   No pelvic free fluid. No fluid collection or hematoma. No inguinal  lymphadenopathy. No inguinal hernia.   IMPRESSION:  1. No hip fracture, dislocation or avascular necrosis.  2. Mild osteoarthritis of bilateral hips.  3. Left superior labral degeneration.  4. Degenerative disease with disc height loss and facet arthropathy  at L3-4, L4-5 and L5-S1.  5. Mild subchondral bone marrow edema on either side of the pubic  symphysis as can be seen with mild osteitis pubis.    On: 07/07/2022 04:54   PATIENT SURVEYS:  FOTO initial 50/ goal 65  COGNITION: Overall cognitive status: Within functional limits for tasks assessed     SENSATION: WFL  EDEMA:  NT  MUSCLE LENGTH: Hamstrings: Right NT deg; Left NT deg Maisie Fus test: Right NT deg; Left NT deg  POSTURE: rounded shoulders and forward head  PALPATION: Generalized L hip tenderness.    LOWER/ UPPER EXTREMITY ROM: B UE AROM WFL (no pain in shoulder)- h/o R shoulder surgery in past B LE AROM WFL (B hip flexion limited to 95 deg./ B hip abduction 30 deg.).    LOWER EXTREMITY MMT:  MMT Right eval Left eval  Hip flexion 4 4-  Hip extension    Hip abduction 4 3+  Hip adduction 4 4  Hip internal rotation    Hip external rotation    Knee flexion 5 5  Knee extension 5 5  Ankle dorsiflexion    Ankle plantarflexion    Ankle inversion    Ankle eversion     (Blank rows = not tested)  B shoulder flexion 4/5 MMT, biceps 5/5 MMT, triceps 4+/5 MMT  LOWER EXTREMITY SPECIAL TESTS:  Hip special tests: Luisa Hart (FABER) test: positive  on L, negative on R.   FUNCTIONAL TESTS:  5 times sit to stand: 17.06 sec./ 16.10 sec.  HR: 88 bpm, O2 sat. 100% 3 minute walk test: 402 feet (increase L hip pain to 4/10)- HR 99 bpm., O2 sat. 99%  Marked fatigue.  Pt. Unable to complete 6 MWT today.    GAIT: Distance walked: in clinic Assistive device utilized: None Level of  assistance: Complete Independence Comments: L antalgic gait with occasional shuffling of feet/ limited heel strike.  Cuing to increase hip flexion/ step pattern/ heel strike while walking in hallway.  Increase in L hip pain after 1 minute of walking in hallway.  Discussed use of SPC.  Tandem stance <10 sec./ difficulty with tandem gait and requires light UE assist in //-bars for safety.    :  963 feet with marked increase in L hip pain to 7/10 at the 3 minute 50 sec. Mark.  HRL 122 bpm/ 98% O2 sat.       TODAY'S TREATMENT:  DATE: 09/08/2022   SUBJECTIVE: Pt. Reports not feeling great over weekend since chemo treatment last Thursday.  Pt. Reports limited appetite and poor energy levels.  Pt. Is trying to stay active with daily tasks but was too active on Sunday.    PAIN: L hip pain (chronic)- 4/10 prior to tx.         There.ex.  Walking in hallway 2 laps.  Discussed weekend activity.    Seated marching/ LAQ 20x.  Seated heel/toe raises 20x.    //-bars: high marching/ lateral walking with no UE assist 3 laps.  Fatigue noted.    5xSTS: 12.06 seconds (marked improvement)  Nustep L3 x 15 min. B UE/LE (0.68 miles) for LE strengthening and cardiovascular conditioning.   Reviewed HEP/ discussed importance of daily activity and walking.    PATIENT EDUCATION:  Education details: Pt educated throughout session about proper posture and technique with exercises. Improved exercise technique, movement at target joints, use of target muscles after min to mod verbal, visual, tactile cues. HEP; Person educated: Patient and Child(ren) Education method: Medical illustrator and handout Education comprehension: verbalized understanding and returned demonstration   HOME EXERCISE PROGRAM: Access Code: [email or web address] URL: [email or web address] Date:  08/04/2022 Prepared by: Ria Comment  Exercises - Sit to Stand Without Arm Support  - 1 x daily - 7 x weekly - 2 sets - 10 reps - Standing March with Counter Support  - 1 x daily - 7 x weekly - 2 sets - 10 reps - Standing Hip Abduction with Unilateral Counter Support  - 1 x daily - 7 x weekly - 2 sets - 10 reps - Seated Hip Abduction with Resistance  - 1 x daily - 7 x weekly - 2 sets - 10 reps - Heel Raises with Counter Support  - 1 x daily - 7 x weekly - 2 sets - 10 reps   ASSESSMENT:  CLINICAL IMPRESSION: Pt. Presents with increased fatigue and unsteadiness of BLE . Pt tires easily therefore frequent rest breaks through out the session. PT discussed detail about her conditions and the significance of walking as she is onto her Chemo treatment sessions.   No changes to HEP and pt. Will continue per POC/ walking program.  As pt advances her chemo treatment pt may be at higher risk of falling 2/2 to fatigue and LLD.  Pt. Waiting to get heel lift soon to correct LLD.   Pt. Motivated and will benefit from skilled PT services to increase LE muscle strength/ functional mobility to prepare for surgery.    OBJECTIVE IMPAIRMENTS: Abnormal gait, decreased activity tolerance, decreased balance, decreased endurance, decreased mobility, difficulty walking, decreased ROM, decreased strength, hypomobility, impaired flexibility, improper body mechanics, postural dysfunction, obesity, and pain.   ACTIVITY LIMITATIONS: carrying, bending, standing, stairs, transfers, and locomotion level  PARTICIPATION LIMITATIONS: meal prep, cleaning, shopping, and community activity  PERSONAL FACTORS: Fitness and Past/current experiences are also affecting patient's functional outcome.   REHAB POTENTIAL: Good  CLINICAL DECISION MAKING: Evolving/moderate complexity  EVALUATION COMPLEXITY: Moderate   GOALS: Goals reviewed with patient? Yes  SHORT TERM GOALS: Target date: 08/22/22 Pt. Will be independent with HEP to  increase hip strength 1/2 muscle grade to improve standing/ walking tolerance.   Baseline: Goal status: Not met   LONG TERM GOALS: Target date: 09/12/22  Pt. Will increase FOTO to 65 to improve pain-free mobility.   Baseline: initial 50.  4/29: 60 (marked improvement) Goal status: Partially met  2.  Pt. Will complete 5xSTS to <  12 seconds to improve functional mobility.   Baseline: see above.  4/29: 12.06 seconds Goal status: Partially met  3.  Pt. Able to complete 30 minutes of walking/ exercise with no increase L hip pain to improve generalized conditioning/ daily mobility.    Baseline: pt. Currently not exercising.  4/29: exercising with limited endurance/ moderate L hip symptoms.  Goal status: Partially met  PLAN:  PT FREQUENCY: 2x/week  PT DURATION: 6 weeks  PLANNED INTERVENTIONS: Therapeutic exercises, Therapeutic activity, Neuromuscular re-education, Balance training, Gait training, Patient/Family education, Self Care, Joint mobilization, Stair training, DME instructions, Aquatic Therapy, Cryotherapy, Traction, Manual therapy, and Re-evaluation  PLAN FOR NEXT SESSION: Progress strengthening/ discuss Chemo tx.  RECERT NEXT TX    Cammie Mcgee, PT, DPT # [phone] 09/08/2022, 11:08 AM

## 2022-09-10 ENCOUNTER — Ambulatory Visit: Payer: Medicare Other | Attending: Internal Medicine | Admitting: Physical Therapy

## 2022-09-10 ENCOUNTER — Encounter: Payer: Self-pay | Admitting: Physical Therapy

## 2022-09-10 DIAGNOSIS — M25552 Pain in left hip: Secondary | ICD-10-CM

## 2022-09-10 DIAGNOSIS — M6281 Muscle weakness (generalized): Secondary | ICD-10-CM | POA: Diagnosis present

## 2022-09-10 DIAGNOSIS — R278 Other lack of coordination: Secondary | ICD-10-CM | POA: Diagnosis present

## 2022-09-10 DIAGNOSIS — R262 Difficulty in walking, not elsewhere classified: Secondary | ICD-10-CM | POA: Diagnosis present

## 2022-09-10 NOTE — Therapy (Signed)
OUTPATIENT PHYSICAL THERAPY LOWER EXTREMITY TREATMENT  Patient Name: [name] MRN: [id number] DOB:1945/06/04, 77 y.o., female Today's Date: 09/10/2022  END OF SESSION:  PT End of Session - 09/10/22 1110     Visit Number 11    Number of Visits 12    Date for PT Re-Evaluation 09/12/22    Authorization Type Medicare A & B, VL based on medical necessity    PT Start Time 1110    PT Stop Time 1152    PT Time Calculation (min) 42 min    Activity Tolerance Patient tolerated treatment well    Behavior During Therapy WFL for tasks assessed/performed             Past Medical History:  Diagnosis Date   CKD (chronic kidney disease), stage III (HCC)    3b   Diabetes mellitus    DVT (deep venous thrombosis) (HCC)    "In my 50's, after abdominal surgery"   Foot fracture, right    History of back surgery 1980   X2   History of colon polyps    History of kidney stones    Hyperlipidemia    Osteoporosis    Pneumonia    PONV (postoperative nausea and vomiting)    Sleep apnea    no CPAP   Vertigo    2 "bad" episodes.  most recent over 5 years ago   Vitamin D deficiency    Wears dentures    full upper   Past Surgical History:  Procedure Laterality Date   ABDOMINAL HYSTERECTOMY     total   APPENDECTOMY     CARDIAC CATHETERIZATION     [facility]    CARDIOVASCULAR STRESS TEST     abnormal, had cath which was normal   CARPAL TUNNEL RELEASE     CATARACT EXTRACTION W/PHACO Right 02/24/2022   Procedure: CATARACT EXTRACTION PHACO AND INTRAOCULAR LENS PLACEMENT (IOC) RIGHT DIABETIC HYDRUS MICROSTENT;  Surgeon: Brenda Crane, MD;  Location: [facility];  Service: Ophthalmology;  Laterality: Right;  Diabetic 4.18 00:44.9   CATARACT EXTRACTION W/PHACO Left 03/10/2022   Procedure: CATARACT EXTRACTION PHACO AND INTRAOCULAR LENS PLACEMENT (IOC) LEFT DIABETIC HYDRUS MICROSTENT 5.86 00:37.6;  Surgeon: Brenda Crane, MD;  Location: [facility];  Service:  Ophthalmology;  Laterality: Left;  Diabetic   CHOLECYSTECTOMY     COLECTOMY     for benign polyps   GLAUCOMA SURGERY     [facility]   KIDNEY STONE SURGERY     x3.  [facility]   Patient Active Problem List   Diagnosis Date Noted   Coronary artery disease due to lipid rich plaque 03/09/2015   DJD (degenerative joint disease), cervical 08/15/2014   Right shoulder pain 07/03/2014   Hypomagnesemia 03/13/2014   Nephrolithiasis 10/10/2013   Medicare annual wellness visit, subsequent 03/10/2013   Postmenopausal estrogen deficiency 03/10/2013   Hyperlipidemia 02/21/2013   Diabetes mellitus type 2 in obese (HCC) 12/06/2012   Right hip pain 09/06/2012   Morbid obesity (HCC) 01/09/2011   Osteopenia 12/25/2010   PCP: Brenda Ali, MD  REFERRING PROVIDER: Cameron Ali, MD  REFERRING DIAG: Unilateral primary osteoarthritis, left hip   THERAPY DIAG:  Pain in left hip  Muscle weakness (generalized)  Difficulty in walking, not elsewhere classified  Other lack of coordination  Rationale for Evaluation and Treatment: Rehabilitation  ONSET DATE: 12/2021 (hip pain)  FROM THE INITIAL EVALUATION SUBJECTIVE:   SUBJECTIVE STATEMENT: Pt. Is currently off all meds except insulin.  7-8/10  L hip/ groin pain at worst with walking.  Pt. Referred to PT to increase generalized strengthening and improve overall mobility prior to upcoming surgery.  Limited with walking due to pain/ fatigue.  Pt. Able to complete grocery shopping but limited.  No use of assistive device and no falls.    PERTINENT HISTORY:  CANCER HISTORY Extrahepatic Cholangiocarcinoma: - 6 weeks of generalized pruritus - Labs notable for transaminitis and elevated alk phos - 07/20/22: MRCP with severe intra and extrahepatic biliary dilatation extending to the distal CBD - 07/24/22: EUS with normal appearing pancreas, ERCP with structure in the lower third of the main bile duct, bile duct biopsy showing poorly differentiated  adenocarcinoma, stent placed ____________________________________________________________________  ASSESSMENT 1. 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 Weight Loss 3. Nausea 4. Left Hip Pain limiting mobility  RECOMMENDATIONS 1. CT Chest (non contrast d/t allergy) 2. CEA, CA 19-9, CMP, CBC 3. Scheduled to see surgery 4. Anticipate next follow up with med onc after surgery to discuss adjuvant therapy 5. Start work with physical therapy, patient already has referral  DISCUSSION We reviewed her diagnostic work up thus far which shows a localized extrahepatic cholangiocarcinoma. Imaging does not show evidence of nodal disease. We will obtain CT chest to complete staging. As this appears to be a resectable cancer we would generally favor upfront surgery most likely followed by adjuvant capecitabine. She has had significant physical decline recently and her mobility has been especially limited due to her left hip. She was evaluated by an orthopedic surgeon recently but now cancer diagnosis is interrupted this workup. Given the extensive surgery required for resection of her tumor we did discuss the possibility of neoadjuvant chemotherapy and prehabilitation if she is not deemed a current surgical candidate.  This patient was seen and discussed with Dr. Forbes Cellar.  Brenda Richters, MD Hematology / Oncology Fellow  ______________________________________________________________________ HISTORY  [name] is seen today at the [facility] for consultation regarding extrahepatic cholangiocarcinoma at the request of Dr. Amada Larson.  She initially presented with over a month of diffuse pruritus. This prompted evaluation in clinic with labs revealing transaminitis and elevated alkaline phosphatase. Subsequent imaging showed narrowing of common bile duct. ERCP with biopsy of distal bile duct revealed  adenocarcinoma.  Over the last few months she has really been limited by left hip pain. She was evaluated by an orthopedic surgeon with plan for a steroid injection but this has been delayed with new cancer diagnosis. She remains independent in ADLs and lives alone. She is now needing assistance with IADLs. For example when she goes grocery shopping she normally sits in the car while her children shop. She denies any chest pain. She does have dyspnea with exertion. No issues with constipation or diarrhea. She has been having some persistent mild nausea without vomiting. Her p.o. intake is also decreased recently. She denies any significant abdominal pain following biliary stent placement.  MEDICAL HISTORY 1. CAD (no prior PCI or surgery) 2. HTN 3. T2DM c/b polyneuropathy, on insulin, follows with endocrinology 4. CKD - G3bA3, follows with nephrology 5. Nephrolithiasis 6. HLD - on rosuvastatin 7. DVT 8. IgG Kappa MGUS  Allergies Allergen Reactions Albuterol Shortness Of Breath Iodine And Iodide Containing Products Hives Metrizamide Swelling Other Swelling Uncoded Allergy. Allergen: SHELLFISH, Other Reaction: OTHER REACTION Tramadol Nausea And Vomiting, Shortness Of Breath and Nausea Only Chest pain Sulfur Other (See Comments) Iodinated Contrast Media Ioxaglate Sodium Metoprolol Other (  See Comments) SEVERE BRADYCARDIA IN 30'S UNKNOWN IF IT WAS A TRUE HEART BLCOK Statins-Hmg-Coa Reductase Inhibitors Other (See Comments) Muscle aches and severe fatigue Sulfasalazine Buprenorphine Rash Losartan Nausea Only Nausea, lightheaded Sulfa (Sulfonamide Antibiotics) Rash and Other (See Comments) Trospium Itching  Medications reviewed in the EMR  FAMILY HISTORY Brother thyroid cancer Sister breast cancer  SOCIAL HISTORY Lives alone. Here with son and daughter. Son is a Engineer, civil (consulting) at [employer] surgical clinic. No tobacco, alcohol, or drug use.  REVIEW OF SYSTEMS The remainder of a comprehensive 10  systems review is negative.  PHYSICAL EXAM BP 156/64  Pulse 66  Temp 36.7 C (98 F) (Temporal)  Resp 18  Ht 156.5 cm (5' 1.61")  Wt 97 kg (213 lb 12.8 oz)  SpO2 100%  BMI 39.60 kg/m GENERAL: well developed, well nourished, in no distress PSYCH: full and appropriate range of affect with good insight and judgement HEENT: NCAT, pupils equal, sclerae anicteric, OP clear without erythema, exudate or ulceration; neck supple without thyromegaly or masses; LYMPH: no cervical, supraclavicular, periumbilical adenopathy PULM: normal resp rate, clear bilaterally without wheezes, rhonchi, rales COR: regular without murmur, rub, gallop GI: abdomen soft, nontender, nondistended, no palpable hepatosplenomegaly EXT: warm and well perfused, no edema SKIN: No rashes  OBJECTIVE DATA LABS 07/16/22: transaminitis, normal bili, elevated alk phos and GGT, Cr 1.87, lipase 160 07/12/22: CBC without cytopenias  PATHOLOGY: reviewed  RADIOLOGY RESULTS (scans are personally reviewed by me and my impression) MRI Abd/MRCP 07/20/22: - biliary dilatation with obstruction in distal CBD, normal appearing liver, GB surgically absent, pancrease with multiple sub centimeter cystic lesions in the hear that 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 We discussed that even small tumors can spread so we do usually do adjuvant therapy. We do not have evidenec that neoadjuvant treatment offers much benefit vs adjuvant therapy, though is certainly something we consider in the setting of preoperative evidence of bulky or high risk nodal disease. I am a little concerned about her conditioning for surgery, so will discuss with multid team after she had spoken with our surgical teams. I saw and evaluated the patient, participating in the key portions of the service. I reviewed the resident's note. I agree  with the resident's findings and plan. I have personally reviewed the patient's medical records and imaging. Kathi Ludwig, MD   PAIN:  Are you having pain? Yes: NPRS scale: 4-5/10 Pain location: L hip Pain description: aching Aggravating factors: walking Relieving factors: resting/ "getting off the hip"  PRECAUTIONS: None  WEIGHT BEARING RESTRICTIONS: No  FALLS:  Has patient fallen in last 6 months? No  LIVING ENVIRONMENT: Lives with: lives alone  Pt. Has good family support that lives local.   Lives in: House/apartment Stairs: No Has following equipment at home: Ramped entry  OCCUPATION: Retired  PLOF: Independent   PATIENT GOALS: Increase muscle strength/ improve pain-free mobility and conditioning.  NEXT MD VISIT: 08/11/22 (meeting with surgeon)   OBJECTIVE:   DIAGNOSTIC FINDINGS: EXAM:  MR OF THE LEFT HIP WITHOUT CONTRAST   TECHNIQUE:  Multiplanar, multisequence MR imaging was performed. No intravenous  contrast was administered.   COMPARISON:  None Available.   FINDINGS:  Bones:   No hip fracture, dislocation or avascular necrosis.   No periosteal reaction or bone destruction. No aggressive osseous  lesion.   Normal sacrum and sacroiliac joints. No SI joint widening or erosive  changes.  Degenerative disease with disc height loss and facet arthropathy at  L3-4, L4-5 and L5-S1.   Mild subchondral bone marrow edema on either side of the pubic  symphysis as can be seen with mild osteitis pubis.   Articular cartilage and labrum   Articular cartilage: Partial-thickness cartilage loss of the femoral  head and acetabulum bilaterally.   Labrum:  Left superior labral degeneration.   Joint or bursal effusion   Joint effusion:  No hip joint effusion.  No SI joint effusion.   Bursae:  No bursal fluid.   Muscles and tendons   Flexors: Normal.   Extensors: Normal.   Abductors: Normal.   Adductors: Normal.   Gluteals: Normal.   Hamstrings:  Normal.   Other findings   No pelvic free fluid. No fluid collection or hematoma. No inguinal  lymphadenopathy. No inguinal hernia.   IMPRESSION:  1. No hip fracture, dislocation or avascular necrosis.  2. Mild osteoarthritis of bilateral hips.  3. Left superior labral degeneration.  4. Degenerative disease with disc height loss and facet arthropathy  at L3-4, L4-5 and L5-S1.  5. Mild subchondral bone marrow edema on either side of the pubic  symphysis as can be seen with mild osteitis pubis.    On: 07/07/2022 04:54   PATIENT SURVEYS:  FOTO initial 50/ goal 65  COGNITION: Overall cognitive status: Within functional limits for tasks assessed     SENSATION: WFL  EDEMA:  NT  MUSCLE LENGTH: Hamstrings: Right NT deg; Left NT deg Maisie Fus test: Right NT deg; Left NT deg  POSTURE: rounded shoulders and forward head  PALPATION: Generalized L hip tenderness.    LOWER/ UPPER EXTREMITY ROM: B UE AROM WFL (no pain in shoulder)- h/o R shoulder surgery in past B LE AROM WFL (B hip flexion limited to 95 deg./ B hip abduction 30 deg.).    LOWER EXTREMITY MMT:  MMT Right eval Left eval  Hip flexion 4 4-  Hip extension    Hip abduction 4 3+  Hip adduction 4 4  Hip internal rotation    Hip external rotation    Knee flexion 5 5  Knee extension 5 5  Ankle dorsiflexion    Ankle plantarflexion    Ankle inversion    Ankle eversion     (Blank rows = not tested)  B shoulder flexion 4/5 MMT, biceps 5/5 MMT, triceps 4+/5 MMT  LOWER EXTREMITY SPECIAL TESTS:  Hip special tests: Luisa Hart (FABER) test: positive  on L, negative on R.   FUNCTIONAL TESTS:  5 times sit to stand: 17.06 sec./ 16.10 sec.  HR: 88 bpm, O2 sat. 100% 3 minute walk test: 402 feet (increase L hip pain to 4/10)- HR 99 bpm., O2 sat. 99%  Marked fatigue.  Pt. Unable to complete 6 MWT today.    GAIT: Distance walked: in clinic Assistive device utilized: None Level of assistance: Complete Independence Comments:  L antalgic gait with occasional shuffling of feet/ limited heel strike.  Cuing to increase hip flexion/ step pattern/ heel strike while walking in hallway.  Increase in L hip pain after 1 minute of walking in hallway.  Discussed use of SPC.  Tandem stance <10 sec./ difficulty with tandem gait and requires light UE assist in //-bars for safety.    :  963 feet with marked increase in L hip pain to 7/10 at the 3 minute 50 sec. Mark.  HRL 122 bpm/ 98% O2 sat.      5xSTS: 12.06 seconds (marked improvement)  TODAY'S TREATMENT:                                                                                                                              DATE: 09/10/2022   SUBJECTIVE: Pt. Reports not feeling great this week and has next chemo treatment tomorrow.  Pt. Reports limited appetite and poor energy levels.     PAIN: L hip pain (chronic)- 4/10 prior to tx.         There.ex.  Walking in hallway 2 laps.  Discussed upcoming chemo tx./ activity level this morning.    Nustep L3 x 12 min. B UE/LE (0.53 miles) for LE strengthening and cardiovascular conditioning.  Pt. Has discomfort in neck with rotation and increase symptoms with L UE use.    Walking in hallway 3 laps (consistent cadence)- cuing for arm swing/ posture.    Seated 3# ankle wts.:  marching/ LAQ 20x.  Standing hip abduction/ hamstring curls/ heel raises/ step touches 20x each.  Light UE assist for safety.     //-bars: high marching/ lateral walking with no UE assist 3 laps.  Walking outside to car with good LE balance/ step pattern.    Reviewed HEP/ discussed importance of daily activity and walking.  No changes to HEP due to chemo scheduled for tomorrow.    PATIENT EDUCATION:  Education details: Pt educated throughout session about proper posture and technique with exercises. Improved exercise technique, movement at target joints, use of target muscles after min to mod verbal, visual, tactile cues. HEP; Person educated: Patient  and Child(ren) Education method: Medical illustrator and handout Education comprehension: verbalized understanding and returned demonstration   HOME EXERCISE PROGRAM: Access Code: [email or web address] URL: [email or web address] Date: 08/04/2022 Prepared by: Ria Comment  Exercises - Sit to Stand Without Arm Support  - 1 x daily - 7 x weekly - 2 sets - 10 reps - Standing March with Counter Support  - 1 x daily - 7 x weekly - 2 sets - 10 reps - Standing Hip Abduction with Unilateral Counter Support  - 1 x daily - 7 x weekly - 2 sets - 10 reps - Seated Hip Abduction with Resistance  - 1 x daily - 7 x weekly - 2 sets - 10 reps - Heel Raises with Counter Support  - 1 x daily - 7 x weekly - 2 sets - 10 reps   ASSESSMENT:  CLINICAL IMPRESSION: Pt motivated to work with PT but limited with neck discomfort/ generalized fatigue today.  Pt. Requires several short seated rest breaks between walking/ LE resisted there.ex.  PT discussed detail about her conditions and the significance of walking as she is onto her Chemo treatment sessions.   No changes to HEP and pt. Will continue per POC/ walking program.  As pt advances her chemo treatment pt may be at higher risk of falling 2/2 to fatigue and LLD.    Pt. Motivated and will benefit from skilled PT  services to increase LE muscle strength/ functional mobility to prepare for surgery.    OBJECTIVE IMPAIRMENTS: Abnormal gait, decreased activity tolerance, decreased balance, decreased endurance, decreased mobility, difficulty walking, decreased ROM, decreased strength, hypomobility, impaired flexibility, improper body mechanics, postural dysfunction, obesity, and pain.   ACTIVITY LIMITATIONS: carrying, bending, standing, stairs, transfers, and locomotion level  PARTICIPATION LIMITATIONS: meal prep, cleaning, shopping, and community activity  PERSONAL FACTORS: Fitness and Past/current experiences are also affecting patient's functional  outcome.   REHAB POTENTIAL: Good  CLINICAL DECISION MAKING: Evolving/moderate complexity  EVALUATION COMPLEXITY: Moderate   GOALS: Goals reviewed with patient? Yes  SHORT TERM GOALS: Target date: 08/22/22 Pt. Will be independent with HEP to increase hip strength 1/2 muscle grade to improve standing/ walking tolerance.   Baseline: Goal status: Not met   LONG TERM GOALS: Target date: 09/12/22  Pt. Will increase FOTO to 65 to improve pain-free mobility.   Baseline: initial 50.  4/29: 60 (marked improvement) Goal status: Partially met  2.  Pt. Will complete 5xSTS to <12 seconds to improve functional mobility.   Baseline: see above.  4/29: 12.06 seconds Goal status: Partially met  3.  Pt. Able to complete 30 minutes of walking/ exercise with no increase L hip pain to improve generalized conditioning/ daily mobility.    Baseline: pt. Currently not exercising.  4/29: exercising with limited endurance/ moderate L hip symptoms.  Goal status: Partially met  PLAN:  PT FREQUENCY: 2x/week  PT DURATION: 6 weeks  PLANNED INTERVENTIONS: Therapeutic exercises, Therapeutic activity, Neuromuscular re-education, Balance training, Gait training, Patient/Family education, Self Care, Joint mobilization, Stair training, DME instructions, Aquatic Therapy, Cryotherapy, Traction, Manual therapy, and Re-evaluation  PLAN FOR NEXT SESSION: Progress strengthening/ discuss Chemo tx.  RECERT/ CHECK GOALS NEXT TX    Cammie Mcgee, [place], DPT # [phone] 09/10/2022, 12:27 PM

## 2022-09-15 ENCOUNTER — Ambulatory Visit: Payer: Medicare Other | Admitting: Physical Therapy

## 2022-09-17 ENCOUNTER — Telehealth: Payer: Self-pay | Admitting: Physical Therapy

## 2022-09-17 ENCOUNTER — Ambulatory Visit: Payer: Medicare Other | Admitting: Physical Therapy

## 2022-09-17 NOTE — Telephone Encounter (Signed)
Patient is currently hospitalized.

## 2022-09-22 ENCOUNTER — Ambulatory Visit: Payer: Medicare Other | Admitting: Physical Therapy

## 2022-09-24 ENCOUNTER — Encounter: Payer: Medicare Other | Admitting: Physical Therapy

## 2022-09-29 ENCOUNTER — Ambulatory Visit: Payer: Medicare Other | Admitting: Physical Therapy

## 2022-10-01 ENCOUNTER — Encounter: Payer: Medicare Other | Admitting: Physical Therapy

## 2022-10-08 ENCOUNTER — Ambulatory Visit: Payer: Medicare Other | Admitting: Physical Therapy

## 2022-10-13 ENCOUNTER — Ambulatory Visit: Payer: Medicare Other | Admitting: Physical Therapy

## 2022-10-15 ENCOUNTER — Encounter: Payer: Medicare Other | Admitting: Physical Therapy

## 2022-10-20 ENCOUNTER — Encounter: Payer: Medicare Other | Admitting: Physical Therapy

## 2022-10-22 ENCOUNTER — Encounter: Payer: Medicare Other | Admitting: Physical Therapy

## 2022-10-27 ENCOUNTER — Encounter: Payer: Medicare Other | Admitting: Physical Therapy

## 2022-10-29 ENCOUNTER — Encounter: Payer: Medicare Other | Admitting: Physical Therapy

## 2022-11-03 ENCOUNTER — Encounter: Payer: Medicare Other | Admitting: Physical Therapy

## 2022-11-05 ENCOUNTER — Encounter: Payer: Medicare Other | Admitting: Physical Therapy

## 2022-11-10 ENCOUNTER — Encounter: Payer: Medicare Other | Admitting: Physical Therapy

## 2022-11-12 ENCOUNTER — Encounter: Payer: Medicare Other | Admitting: Physical Therapy

## 2023-01-21 ENCOUNTER — Ambulatory Visit: Payer: Medicare Other | Attending: Internal Medicine

## 2023-01-21 DIAGNOSIS — R262 Difficulty in walking, not elsewhere classified: Secondary | ICD-10-CM

## 2023-01-21 DIAGNOSIS — M6281 Muscle weakness (generalized): Secondary | ICD-10-CM

## 2023-01-21 NOTE — Therapy (Addendum)
OUTPATIENT PHYSICAL THERAPY BALANCE EVALUATION   Patient Name: [name] MRN: [id number] DOB:[birth year], 77 y.o., female Today's Date: 01/21/2023  END OF SESSION:  PT End of Session - 01/21/23 0940     Visit Number 1    Number of Visits 16    Date for PT Re-Evaluation 09/12/22    Authorization Type Medicare A & B, VL based on medical necessity    PT Start Time 0940    PT Stop Time 1015    PT Time Calculation (min) 35 min    Equipment Utilized During Treatment Gait belt    Activity Tolerance Patient tolerated treatment well    Behavior During Therapy WFL for tasks assessed/performed            Past Medical History:  Diagnosis Date   CKD (chronic kidney disease), stage III (HCC)    3b   Diabetes mellitus    DVT (deep venous thrombosis) (HCC)    "In my 50's, after abdominal surgery"   Foot fracture, right    History of back surgery 1980   X2   History of colon polyps    History of kidney stones    Hyperlipidemia    Osteoporosis    Pneumonia    PONV (postoperative nausea and vomiting)    Sleep apnea    no CPAP   Vertigo    2 "bad" episodes.  most recent over 5 years ago   Vitamin D deficiency    Wears dentures    full upper   Past Surgical History:  Procedure Laterality Date   ABDOMINAL HYSTERECTOMY     total   APPENDECTOMY     CARDIAC CATHETERIZATION     [facility]    CARDIOVASCULAR STRESS TEST     abnormal, had cath which was normal   CARPAL TUNNEL RELEASE     CATARACT EXTRACTION W/PHACO Right 02/24/2022   Procedure: CATARACT EXTRACTION PHACO AND INTRAOCULAR LENS PLACEMENT (IOC) RIGHT DIABETIC HYDRUS MICROSTENT;  Surgeon: Nevada Crane, MD;  Location: [facility];  Service: Ophthalmology;  Laterality: Right;  Diabetic 4.18 00:44.9   CATARACT EXTRACTION W/PHACO Left 03/10/2022   Procedure: CATARACT EXTRACTION PHACO AND INTRAOCULAR LENS PLACEMENT (IOC) LEFT DIABETIC HYDRUS MICROSTENT 5.86 00:37.6;  Surgeon: Nevada Crane,  MD;  Location: [facility];  Service: Ophthalmology;  Laterality: Left;  Diabetic   CHOLECYSTECTOMY     COLECTOMY     for benign polyps   GLAUCOMA SURGERY     [facility]   KIDNEY STONE SURGERY     x3.  [facility]   Patient Active Problem List   Diagnosis Date Noted   Coronary artery disease due to lipid rich plaque 03/09/2015   DJD (degenerative joint disease), cervical 08/15/2014   Right shoulder pain 07/03/2014   Hypomagnesemia 03/13/2014   Nephrolithiasis 10/10/2013   Medicare annual wellness visit, subsequent 03/10/2013   Postmenopausal estrogen deficiency 03/10/2013   Hyperlipidemia 02/21/2013   Diabetes mellitus type 2 in obese (HCC) 12/06/2012   Right hip pain 09/06/2012   Morbid obesity (HCC) 01/09/2011   Osteopenia 12/25/2010   PCP: Cameron Ali, MD  REFERRING PROVIDER: Cameron Ali*   REFERRING DIAG: 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 She reports progressive weakening of bilateral LE and has been limited to household distances with RW support. She reports difficulty with sit to stand from lower surfaces and vertigo/dizziness with bed mobility tasks. She's recently discharged form home health physical therapy and has had a history of successful past episodes with outpatient physical therapy. She has denies any recent falls and  has family (daughter, [name]) who provides support PRN. She currently denies n/t or changes in sensation in bilateral LE. She denies chest pain but has dyspnea with exertion.   01/13/2023:  She has been off treatment since her recent hospitalization for septic shock and subsequent acute renal failure. She is still quite deconditioned. Concern regarding starting back to chemotherapy may cause further deconditioning and illness. Pt encouraged about what her options are with her oncology team. She wants to maximize the time she has left, at the same time does not want to [name] through hospitalization like she just went through. She notes that hospice was discussed while she was in the hospital and she does not feel quite ready for that yet, but feels like she would be doing better with outpatient physical therapy.   Pain: No Numbness/Tingling: Yes Focal Weakness: Yes At the knee   Recent changes in overall health/medication: No Prior history of physical therapy for balance:  Yes Dominant hand: right Imaging: No  Red flags: Negative for bowel/bladder changes, saddle paresthesia, personal history of cancer, h/o spinal tumors, h/o compression fx, h/o abdominal aneurysm, abdominal pain, chills/fever, night sweats, nausea, vomiting, unrelenting pain  PRECAUTIONS: Fall  WEIGHT BEARING RESTRICTIONS: No  FALLS: Has patient fallen in last 6 months? No, Directional pattern for falls: No  Living Environment Lives with: lives with their family Lives in: House/apartment Stairs: No Has following equipment at home: Environmental consultant - 2 wheeled, Wheelchair (manual), and Ramped entry  Prior level of function: Independent with basic ADLs and Requires assistive device for independence  Hobbies: Playing with great grandchildren  Patient Goals: "I want to get stronger and be able to walk more"    OBJECTIVE:   Patient Surveys  FOTO: 45 ,predicted improvement to 76  Cognition Patient is oriented to person, place, and  time.  Recent memory is intact.  Remote memory is intact.  Attention span and concentration are intact.  Expressive speech is intact.  Patient's fund of knowledge is within normal limits for educational level.    Gross Musculoskeletal Assessment Tremor: None Bulk: Normal Tone: Normal  Posture: No gross abnormalities noted in standing. Notable forward head posture in seated.   AROM AROM (Normal range in degrees) AROM   Lumbar   Flexion (65)   Extension (30)   Right lateral flexion (25)   Left lateral flexion (25)   Right rotation (30)   Left rotation (30)       Hip Right Left  Flexion (125)    Extension (15)    Abduction (40)    Adduction     Internal Rotation (45)    External Rotation (45)        Knee    Flexion (135)    Extension (0)        Ankle    Dorsiflexion (20)    Plantarflexion (50)    Inversion (35)    Eversion (15)    (* = pain; Blank rows = not tested)  LE  MMT: MMT (out of 5) Right  Left   Hip flexion 4- 4-  Hip extension    Hip abduction    Hip adduction    Hip internal rotation 4- 4-  Hip external rotation 4- 4-  Knee flexion 4 4  Knee extension 4 4  Ankle dorsiflexion 4 4  Ankle plantarflexion 4 4  Ankle inversion    Ankle eversion    (* = pain; Blank rows = not tested)  Sensation Grossly intact to light touch throughout bilateral LEs as determined by testing dermatomes L2-S2. Proprioception, stereognosis, and hot/cold testing deferred on this date.  Reflexes R/L Knee Jerk (L3/4): 2+/2+  Ankle Jerk (S1/2): 2+/2+   Cranial Nerves Deferred   Coordination/Cerebellar Deferred  Bed mobility: Deferred  Transfers: Assistive device utilized: Wheelchair (manual)  Sit to stand: Complete Independence Stand to sit: Complete Independence Chair to chair: Complete Independence Floor: Deferred  Curb:  Deferred   Stairs: Deferred   Gait: Gait pattern: step to pattern, decreased step length- Right, decreased step length- Left,  decreased stride length, and trunk flexed Distance walked: 74m  Assistive device utilized: Walker - 2 wheeled Level of assistance: Modified independence Comments: In addition to gait pattern listed above, patient with slowed gait and easily fatigued.   Functional Outcome Measures  Results Comments  BERG    DGI    FGA    TUG 24.68 seconds With Roller Walker   5TSTS    6 Minute Walk Test    10 Meter Gait Speed Self-selected: 17.70  s = .56 m/s; With roller walker   (Blank rows = not tested)   TODAY'S TREATMENT  Deferred   PATIENT EDUCATION:  Education details: HEP and Plan of Care Person educated: Patient Education method: Explanation Education comprehension: verbalized understanding   HOME EXERCISE PROGRAM:  Access Code: [email or web address] URL: [email or web address] Date: 01/23/2023 Prepared by: Ria Comment  Exercises - Sit to Stand with Armchair  - 1 x daily - 7 x weekly - 3 sets - 10 reps - Seated Long Arc Quad  - 1 x daily - 7 x weekly - 3 sets - 10 reps - Standing March with Counter Support  - 1 x daily - 7 x weekly - 3 sets - 10 reps   ASSESSMENT:  CLINICAL IMPRESSION: Patient is a 77 y.o. female who was seen today for physical therapy evaluation and treatment for conditioning and strengthening. Prior to current episode, patient was independent in all functional mobility and ADLs. Objective findings are significant with poor strength, activity tolerance and endurance. She uses a wheelchair for community ambulation distances and a RW for household distances. She demonstrated 24.68s on the TUG and .56 m/s on indicating decreased LE strength and increased fall risk. Due to her current progressive diagnosis and today's performance Pt will benefit from PT services to address deficits in strength, balance, and mobility in order to return to full function at home.   OBJECTIVE IMPAIRMENTS: decreased activity tolerance, decreased endurance, difficulty walking,  decreased strength, dizziness, obesity, and pain.   ACTIVITY LIMITATIONS: carrying, lifting, standing, squatting, and stairs  PARTICIPATION LIMITATIONS: driving, community activity, and yard work  PERSONAL FACTORS: Age, Past/current experiences, Time since onset of injury/illness/exacerbation, and 3+ comorbidities: CKD III, DM, Osteoporosis   are also affecting patient's functional outcome.   REHAB POTENTIAL: Fair patient with progressive weakening and decreased activity tolerance.  CLINICAL DECISION MAKING: Evolving/moderate complexity  EVALUATION COMPLEXITY: Moderate   GOALS: Goals reviewed with patient? Yes  SHORT TERM GOALS:  Target date: 04/15/2023  Pt will be independent with HEP in order to improve strength and balance in order to decrease fall risk and improve function at home. Baseline:  Goal status: INITIAL   LONG TERM GOALS: Target date: 04/15/2023  Pt will increase FOTO to at least 56 to demonstrate significant improvement in function at home related to balance  Baseline: 01/21/23: 45 Goal status: INITIAL  2. Pt will decrease TUG to below 14 seconds/decrease in order to demonstrate decreased fall risk.  Baseline: 01/21/2023: 24.68s Goal status: INITIAL  3. Pt will increase self-selected by at least 0.13 m/s in order to demonstrate clinically significant improvement in community ambulation.   Baseline: 01/21/2023: .56 m/s  Goal status: INITIAL   PLAN: PT FREQUENCY: 2x/week  PT DURATION: 8 weeks  PLANNED INTERVENTIONS: Therapeutic exercises, Therapeutic activity, Neuromuscular re-education, Balance training, Gait training, Patient/Family education, Self Care, Joint mobilization, Joint manipulation, Vestibular training, Canalith repositioning, Orthotic/Fit training, DME instructions, Dry Needling, Electrical stimulation, Spinal manipulation, Spinal mobilization, Cryotherapy, Moist heat, Taping, Traction, Ultrasound, Ionotophoresis 4mg /ml Dexamethasone, Manual  therapy, and Re-evaluation.  PLAN FOR NEXT SESSION: Assess Hip and knee ROM, Progress LE strength,    Sharalyn Ink Huprich PT, DPT, GCS  Darrel Baroni 01/21/2023, 3:32 PM

## 2023-01-29 ENCOUNTER — Ambulatory Visit: Payer: Medicare Other

## 2023-02-02 ENCOUNTER — Ambulatory Visit: Payer: Medicare Other

## 2023-02-02 DIAGNOSIS — M6281 Muscle weakness (generalized): Secondary | ICD-10-CM

## 2023-02-02 DIAGNOSIS — R262 Difficulty in walking, not elsewhere classified: Secondary | ICD-10-CM | POA: Diagnosis present

## 2023-02-02 NOTE — Therapy (Cosign Needed)
OUTPATIENT PHYSICAL THERAPY BALANCE TREATMENT   Patient Name: Shamel Lauwers MRN: [id number] DOB:1946-04-19, 77 y.o., female Today's Date: 02/03/2023  END OF SESSION:  PT End of Session - 02/02/23 1317     Visit Number 2    Number of Visits 16    Date for PT Re-Evaluation 09/12/22    Authorization Type Medicare A & B, VL based on medical necessity    PT Start Time 1315    PT Stop Time 1400    PT Time Calculation (min) 45 min    Equipment Utilized During Treatment --    Activity Tolerance Patient tolerated treatment well    Behavior During Therapy WFL for tasks assessed/performed            Past Medical History:  Diagnosis Date   CKD (chronic kidney disease), stage III (HCC)    3b   Diabetes mellitus    DVT (deep venous thrombosis) (HCC)    "In my 50's, after abdominal surgery"   Foot fracture, right    History of back surgery 1980   X2   History of colon polyps    History of kidney stones    Hyperlipidemia    Osteoporosis    Pneumonia    PONV (postoperative nausea and vomiting)    Sleep apnea    no CPAP   Vertigo    2 "bad" episodes.  most recent over 5 years ago   Vitamin D deficiency    Wears dentures    full upper   Past Surgical History:  Procedure Laterality Date   ABDOMINAL HYSTERECTOMY     total   APPENDECTOMY     CARDIAC CATHETERIZATION     [facility]    CARDIOVASCULAR STRESS TEST     abnormal, had cath which was normal   CARPAL TUNNEL RELEASE     CATARACT EXTRACTION W/PHACO Right 02/24/2022   Procedure: CATARACT EXTRACTION PHACO AND INTRAOCULAR LENS PLACEMENT (IOC) RIGHT DIABETIC HYDRUS MICROSTENT;  Surgeon: Nevada Crane, MD;  Location: [facility];  Service: Ophthalmology;  Laterality: Right;  Diabetic 4.18 00:44.9   CATARACT EXTRACTION W/PHACO Left 03/10/2022   Procedure: CATARACT EXTRACTION PHACO AND INTRAOCULAR LENS PLACEMENT (IOC) LEFT DIABETIC HYDRUS MICROSTENT 5.86 00:37.6;  Surgeon: Nevada Crane, MD;   Location: [facility];  Service: Ophthalmology;  Laterality: Left;  Diabetic   CHOLECYSTECTOMY     COLECTOMY     for benign polyps   GLAUCOMA SURGERY     [facility]   KIDNEY STONE SURGERY     x3.  [facility]   Patient Active Problem List   Diagnosis Date Noted   Coronary artery disease due to lipid rich plaque 03/09/2015   DJD (degenerative joint disease), cervical 08/15/2014   Right shoulder pain 07/03/2014   Hypomagnesemia 03/13/2014   Nephrolithiasis 10/10/2013   Medicare annual wellness visit, subsequent 03/10/2013   Postmenopausal estrogen deficiency 03/10/2013   Hyperlipidemia 02/21/2013   Diabetes mellitus type 2 in obese (HCC) 12/06/2012   Right hip pain 09/06/2012   Morbid obesity (HCC) 01/09/2011   Osteopenia 12/25/2010   PCP: Cameron Ali, MD  REFERRING PROVIDER: Cameron Ali*   REFERRING DIAG: 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 She reports progressive weakening of bilateral LE and has been limited to household distances with RW support. She reports difficulty with sit to stand from lower surfaces and vertigo/dizziness with bed mobility tasks. She's recently discharged form home health physical therapy and has had a history of successful past episodes with outpatient physical therapy. She has denies any recent falls and has  family (daughter, [name]) who provides support PRN. She currently denies n/t or changes in sensation in bilateral LE. She denies chest pain but has dyspnea with exertion.   01/13/2023:  She has been off treatment since her recent hospitalization for septic shock and subsequent acute renal failure. She is still quite deconditioned. Concern regarding starting back to chemotherapy may cause further deconditioning and illness. Pt encouraged about what her options are with her oncology team. She wants to maximize the time she has left, at the same time does not want to [place] Wafer through hospitalization like she just went through. She notes that hospice was discussed while she was in the hospital and she does not feel quite ready for that yet, but feels like she would be doing better with outpatient physical therapy.   Pain: No Numbness/Tingling: Yes Focal Weakness: Yes At the knee   Recent changes in overall health/medication: No Prior history of physical therapy for balance:  Yes Dominant hand: right Imaging: No  Red flags: Negative for bowel/bladder changes, saddle paresthesia, personal history of cancer, h/o spinal tumors, h/o compression fx, h/o abdominal aneurysm, abdominal pain, chills/fever, night sweats, nausea, vomiting, unrelenting pain  PRECAUTIONS: Fall  WEIGHT BEARING RESTRICTIONS: No  FALLS: Has patient fallen in last 6 months? No, Directional pattern for falls: No  Living Environment Lives with: lives with their family Lives in: House/apartment Stairs: No Has following equipment at home: Environmental consultant - 2 wheeled, Wheelchair (manual), and Ramped entry  Prior level of function: Independent with basic ADLs and Requires assistive device for independence  Hobbies: Playing with great grandchildren  Patient Goals: "I want to get stronger and be able to walk more"    OBJECTIVE:   Patient Surveys  FOTO: 45 ,predicted improvement to 41  Cognition Patient is oriented to person, place, and  time.  Recent memory is intact.  Remote memory is intact.  Attention span and concentration are intact.  Expressive speech is intact.  Patient's fund of knowledge is within normal limits for educational level.    Gross Musculoskeletal Assessment Tremor: None Bulk: Normal Tone: Normal  Posture: No gross abnormalities noted in standing. Notable forward head posture in seated.   AROM AROM (Normal range in degrees) AROM   Lumbar   Flexion (65)   Extension (30)   Right lateral flexion (25)   Left lateral flexion (25)   Right rotation (30)   Left rotation (30)       Hip Right Left  Flexion (125)    Extension (15)    Abduction (40)    Adduction     Internal Rotation (45)    External Rotation (45)        Knee    Flexion (135)    Extension (0)        Ankle    Dorsiflexion (20)    Plantarflexion (50)    Inversion (35)    Eversion (15)    (* = pain; Blank rows = not tested)  LE  MMT: MMT (out of 5) Right  Left   Hip flexion 4- 4-  Hip extension    Hip abduction    Hip adduction    Hip internal rotation 4- 4-  Hip external rotation 4- 4-  Knee flexion 4 4  Knee extension 4 4  Ankle dorsiflexion 4 4  Ankle plantarflexion 4 4  Ankle inversion    Ankle eversion    (* = pain; Blank rows = not tested)  Sensation Grossly intact to light touch throughout bilateral LEs as determined by testing dermatomes L2-S2. Proprioception, stereognosis, and hot/cold testing deferred on this date.  Reflexes R/L Knee Jerk (L3/4): 2+/2+  Ankle Jerk (S1/2): 2+/2+   Cranial Nerves Deferred   Coordination/Cerebellar Deferred  Bed mobility: Deferred  Transfers: Assistive device utilized: Wheelchair (manual)  Sit to stand: Complete Independence Stand to sit: Complete Independence Chair to chair: Complete Independence Floor: Deferred  Curb:  Deferred   Stairs: Deferred   Gait: Gait pattern: step to pattern, decreased step length- Right, decreased step length- Left,  decreased stride length, and trunk flexed Distance walked: 76m  Assistive device utilized: Walker - 2 wheeled Level of assistance: Modified independence Comments: In addition to gait pattern listed above, patient with slowed gait and easily fatigued.   Functional Outcome Measures  Results Comments  BERG    DGI    FGA    TUG 24.68 seconds With Roller Walker   5TSTS    6 Minute Walk Test    10 Meter Gait Speed Self-selected: 17.70  s = .56 m/s; With roller walker   (Blank rows = not tested)   TODAY'S TREATMENT   Subjective: Patient reported dizziness and vertigo getting in and out of bed recently. No further questions or concerns.  Pain: Denies Resting Pain  Ther-ex  Knee Extension and Flexion, manual resistance 2 x 10 BLE;   Canalith Repositioning Treatment Right Dix hallpike negative for nystagmus or reports of vertigo. Left Dix Hallpike positive for upbeating left torsional nystagmus lasting10-30s and concurrent vertigo. Pt treated with 2 bouts of Epley Maneuver for presumed L posterior canal BPPV. One minute holds in each position and retesting between maneuvers. After first maneuver pt reports no further vertigo or nystagmus. Completed second maneuver. Pt too fatigued afterwards to continue additional exercises. Discussed instructions post canalith repositioning with patient;   PATIENT EDUCATION:  Education details: HEP and Plan of Care Person educated: Patient Education method: Explanation Education comprehension: verbalized understanding   HOME EXERCISE PROGRAM:  Access Code: [email or web address] URL: [email or web address] Date: 01/23/2023 Prepared by: Ria Comment  Exercises - Sit to Stand with Armchair  - 1 x daily - 7 x weekly - 3 sets - 10 reps - Seated Long Arc Quad  - 1 x daily - 7 x weekly - 3 sets - 10 reps - Standing March with Counter Support  - 1 x daily - 7 x weekly - 3 sets - 10 reps   ASSESSMENT:  CLINICAL IMPRESSION: Patient reported to  physical therapy highly motivated for first follow up. Static and Dynamic Balance exercises limited due to patient report of recent experiences of dizziness and vertigo. Pt positive in left dix hallpike for left posterior canalithiasis due to upward left torsional beating lasting less than 30 sec with concurrent reports of vertigo. PT performed Epley maneuver two times; second maneuver with report of improved symptoms. She continues to present with bilateral LE weakness and decreased activity tolerance. Plan for next session to follow up on BPPV testing and  continue progressing balance and strength. Pt will benefit from PT services to address deficits in strength, balance, and mobility in order to return to full function at home.   OBJECTIVE IMPAIRMENTS: decreased activity tolerance, decreased endurance, difficulty walking, decreased strength, dizziness, obesity, and pain.   ACTIVITY LIMITATIONS: carrying, lifting, standing, squatting, and stairs  PARTICIPATION LIMITATIONS: driving, community activity, and yard work  PERSONAL FACTORS: Age, Past/current experiences, Time since onset of injury/illness/exacerbation, and 3+ comorbidities: CKD III, DM, Osteoporosis   are also affecting patient's functional outcome.   REHAB POTENTIAL: Fair patient with progressive weakening and decreased activity tolerance.  CLINICAL DECISION MAKING: Evolving/moderate complexity  EVALUATION COMPLEXITY: Moderate   GOALS: Goals reviewed with patient? Yes  SHORT TERM GOALS: Target date: 04/28/2023  Pt will be independent with HEP in order to improve strength and balance in order to decrease fall risk and improve function at home. Baseline:  Goal status: INITIAL   LONG TERM GOALS: Target date: 04/28/2023  Pt will increase FOTO to at least 56 to demonstrate significant improvement in function at home related to balance  Baseline: 01/21/23: 45 Goal status: INITIAL  2. Pt will decrease TUG to below 14  seconds/decrease in order to demonstrate decreased fall risk.  Baseline: 01/21/2023: 24.68s Goal status: INITIAL  3. Pt will increase self-selected by at least 0.13 m/s in order to demonstrate clinically significant improvement in community ambulation.   Baseline: 01/21/2023: .56 m/s  Goal status: INITIAL   PLAN: PT FREQUENCY: 2x/week  PT DURATION: 8 weeks  PLANNED INTERVENTIONS: Therapeutic exercises, Therapeutic activity, Neuromuscular re-education, Balance training, Gait training, Patient/Family education, Self Care, Joint mobilization, Joint manipulation, Vestibular training, Canalith repositioning, Orthotic/Fit training, DME instructions, Dry Needling, Electrical stimulation, Spinal manipulation, Spinal mobilization, Cryotherapy, Moist heat, Taping, Traction, Ultrasound, Ionotophoresis 4mg /ml Dexamethasone, Manual therapy, and Re-evaluation.  PLAN FOR NEXT SESSION: Assess Hip and knee ROM, Progress LE strength,   Chris Asyah Candler SPT Sharalyn Ink Huprich PT, DPT, GCS  Huprich,Jason 02/03/2023, 2:44 PM

## 2023-02-03 ENCOUNTER — Ambulatory Visit: Payer: Medicare Other

## 2023-02-09 ENCOUNTER — Ambulatory Visit: Payer: Medicare Other

## 2023-02-11 ENCOUNTER — Ambulatory Visit: Payer: Medicare Other | Attending: Internal Medicine

## 2023-02-11 DIAGNOSIS — R278 Other lack of coordination: Secondary | ICD-10-CM | POA: Insufficient documentation

## 2023-02-11 DIAGNOSIS — M25552 Pain in left hip: Secondary | ICD-10-CM | POA: Insufficient documentation

## 2023-02-11 DIAGNOSIS — R262 Difficulty in walking, not elsewhere classified: Secondary | ICD-10-CM | POA: Insufficient documentation

## 2023-02-11 DIAGNOSIS — M6281 Muscle weakness (generalized): Secondary | ICD-10-CM | POA: Diagnosis present

## 2023-02-11 NOTE — Therapy (Addendum)
OUTPATIENT PHYSICAL THERAPY BALANCE TREATMENT   Patient Name: [name] MRN: [id number] DOB:1945/06/21, 77 y.o., female Today's Date: 02/11/2023  END OF SESSION:  PT End of Session - 02/11/23 1406     Visit Number 3    Number of Visits 16    Date for PT Re-Evaluation 09/12/22    Authorization Type Medicare A & B, VL based on medical necessity    PT Start Time 1406    PT Stop Time 1445    PT Time Calculation (min) 39 min    Activity Tolerance Patient tolerated treatment well    Behavior During Therapy WFL for tasks assessed/performed            Past Medical History:  Diagnosis Date   CKD (chronic kidney disease), stage III (HCC)    3b   Diabetes mellitus    DVT (deep venous thrombosis) (HCC)    "In my 50's, after abdominal surgery"   Foot fracture, right    History of back surgery 1980   X2   History of colon polyps    History of kidney stones    Hyperlipidemia    Osteoporosis    Pneumonia    PONV (postoperative nausea and vomiting)    Sleep apnea    no CPAP   Vertigo    2 "bad" episodes.  most recent over 5 years ago   Vitamin D deficiency    Wears dentures    full upper   Past Surgical History:  Procedure Laterality Date   ABDOMINAL HYSTERECTOMY     total   APPENDECTOMY     CARDIAC CATHETERIZATION     [facility]    CARDIOVASCULAR STRESS TEST     abnormal, had cath which was normal   CARPAL TUNNEL RELEASE     CATARACT EXTRACTION W/PHACO Right 02/24/2022   Procedure: CATARACT EXTRACTION PHACO AND INTRAOCULAR LENS PLACEMENT (IOC) RIGHT DIABETIC HYDRUS MICROSTENT;  Surgeon: Nevada Crane, MD;  Location: [facility];  Service: Ophthalmology;  Laterality: Right;  Diabetic 4.18 00:44.9   CATARACT EXTRACTION W/PHACO Left 03/10/2022   Procedure: CATARACT EXTRACTION PHACO AND INTRAOCULAR LENS PLACEMENT (IOC) LEFT DIABETIC HYDRUS MICROSTENT 5.86 00:37.6;  Surgeon: Nevada Crane, MD;  Location: [facility];  Service:  Ophthalmology;  Laterality: Left;  Diabetic   CHOLECYSTECTOMY     COLECTOMY     for benign polyps   GLAUCOMA SURGERY     [facility]   KIDNEY STONE SURGERY     x3.  [facility]   Patient Active Problem List   Diagnosis Date Noted   Coronary artery disease due to lipid rich plaque 03/09/2015   DJD (degenerative joint disease), cervical 08/15/2014   Right shoulder pain 07/03/2014   Hypomagnesemia 03/13/2014   Nephrolithiasis 10/10/2013   Medicare annual wellness visit, subsequent 03/10/2013   Postmenopausal estrogen deficiency 03/10/2013   Hyperlipidemia 02/21/2013   Type 2 diabetes mellitus with obesity (HCC) 12/06/2012   Right hip pain 09/06/2012   Morbid obesity (HCC) 01/09/2011   Osteopenia 12/25/2010   PCP: Cameron Ali, MD  REFERRING PROVIDER: Cameron Ali*   REFERRING DIAG: 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  ONSET DATE: Chronic   FOLLOW-UP APPT SCHEDULED WITH REFERRING PROVIDER: Deferred   SUBJECTIVE:  SUBJECTIVE STATEMENT:  Generalized Weakness   PERTINENT HISTORY:  Per Chart review:  Patient presents to physical therapy with progressive deconditioning and activity tolerance. She reports progressive weakening of bilateral LE and has been limited to household distances with RW support. She reports difficulty with sit to stand from lower surfaces and vertigo/dizziness with bed mobility tasks. She's recently discharged form home health physical therapy and has had a history of successful past episodes with outpatient physical therapy. She has denies any recent falls and has family (daughter, [name]) who provides support PRN. She currently denies n/t or changes in sensation  in bilateral LE. She denies chest pain but has dyspnea with exertion.   01/13/2023:  She has been off treatment since her recent hospitalization for septic shock and subsequent acute renal failure. She is still quite deconditioned. Concern regarding starting back to chemotherapy may cause further deconditioning and illness. Pt encouraged about what her options are with her oncology team. She wants to maximize the time she has left, at the same time does not want to Kye Silverstein through hospitalization like she just went through. She notes that hospice was discussed while she was in the hospital and she does not feel quite ready for that yet, but feels like she would be doing better with outpatient physical therapy.   Pain: No Numbness/Tingling: Yes Focal Weakness: Yes At the knee   Recent changes in overall health/medication: No Prior history of physical therapy for balance:  Yes Dominant hand: right Imaging: No  Red flags: Negative for bowel/bladder changes, saddle paresthesia, personal history of cancer, h/o spinal tumors, h/o compression fx, h/o abdominal aneurysm, abdominal pain, chills/fever, night sweats, nausea, vomiting, unrelenting pain  PRECAUTIONS: Fall  WEIGHT BEARING RESTRICTIONS: No  FALLS: Has patient fallen in last 6 months? No, Directional pattern for falls: No  Living Environment Lives with: lives with their family Lives in: House/apartment Stairs: No Has following equipment at home: Environmental consultant - 2 wheeled, Wheelchair (manual), and Ramped entry  Prior level of function: Independent with basic ADLs and Requires assistive device for independence  Hobbies: Playing with great grandchildren  Patient Goals: "I want to get stronger and be able to walk more"    OBJECTIVE:   Patient Surveys  FOTO: 45 ,predicted improvement to 61  Cognition Patient is oriented to person, place, and time.  Recent memory is intact.  Remote memory is intact.  Attention span and concentration are  intact.  Expressive speech is intact.  Patient's fund of knowledge is within normal limits for educational level.    Gross Musculoskeletal Assessment Tremor: None Bulk: Normal Tone: Normal  Posture: No gross abnormalities noted in standing. Notable forward head posture in seated.   AROM AROM (Normal range in degrees) AROM   Lumbar   Flexion (65)   Extension (30)   Right lateral flexion (25)   Left lateral flexion (25)   Right rotation (30)   Left rotation (30)       Hip Right Left  Flexion (125)    Extension (15)    Abduction (40)    Adduction     Internal Rotation (45)    External Rotation (45)        Knee    Flexion (135)    Extension (0)        Ankle    Dorsiflexion (20)    Plantarflexion (50)    Inversion (35)    Eversion (15)    (* = pain; Blank rows = not tested)  LE  MMT: MMT (out of 5) Right  Left   Hip flexion 4- 4-  Hip extension    Hip abduction    Hip adduction    Hip internal rotation 4- 4-  Hip external rotation 4- 4-  Knee flexion 4 4  Knee extension 4 4  Ankle dorsiflexion 4 4  Ankle plantarflexion 4 4  Ankle inversion    Ankle eversion    (* = pain; Blank rows = not tested)  Sensation Grossly intact to light touch throughout bilateral LEs as determined by testing dermatomes L2-S2. Proprioception, stereognosis, and hot/cold testing deferred on this date.  Reflexes R/L Knee Jerk (L3/4): 2+/2+  Ankle Jerk (S1/2): 2+/2+   Cranial Nerves Deferred   Coordination/Cerebellar Deferred  Bed mobility: Deferred  Transfers: Assistive device utilized: Wheelchair (manual)  Sit to stand: Complete Independence Stand to sit: Complete Independence Chair to chair: Complete Independence Floor: Deferred  Curb:  Deferred   Stairs: Deferred   Gait: Gait pattern: step to pattern, decreased step length- Right, decreased step length- Left, decreased stride length, and trunk flexed Distance walked: 67m  Assistive device utilized: Walker  - 2 wheeled Level of assistance: Modified independence Comments: In addition to gait pattern listed above, patient with slowed gait and easily fatigued.   Functional Outcome Measures  Results Comments  BERG    DGI    FGA    TUG 24.68 seconds With Roller Walker   5TSTS    6 Minute Walk Test    10 Meter Gait Speed Self-selected: 17.70  s = .56 m/s; With roller walker   (Blank rows = not tested)   TODAY'S TREATMENT   Subjective: Patient reported improvements with dizziness and vertigo. She reported decreased frequency in symptoms. Chest pain following removal and exchange of stent procedure on Friday. No further questions or concerns.  Pain: 8/10 in Chest  Ther-ex  Knee Extension and Flexion against manual resistance 2 x 10 BLE;  Seated Heel Raises, against manual resistance 2 x 10 BLE; Seated Toe Raises, against manual resistance 1 x 12 BLE;  Seated LE Ball Squeeze 2 x 10 BLE; Seated Hip Abduction against manual resistance 2 x 10 BLE;   Wheel Chair Mobility in the hallway: Forward Direction  x 10 m Retro x 10 m Weaving Through Cones x 48m Retro Direction with Reaching for cones x 70m  PATIENT EDUCATION:  Education details: HEP and Plan of Care Person educated: Patient Education method: Explanation Education comprehension: verbalized understanding   HOME EXERCISE PROGRAM:  Access Code: [email or web address] URL: [email or web address] Date: 01/23/2023 Prepared by: Ria Comment  Exercises - Sit to Stand with Armchair  - 1 x daily - 7 x weekly - 3 sets - 10 reps - Seated Long Arc Quad  - 1 x daily - 7 x weekly - 3 sets - 10 reps - Standing March with Counter Support  - 1 x daily - 7 x weekly - 3 sets - 10 reps   ASSESSMENT:  CLINICAL IMPRESSION: Today's session with a main focus on continued LE strengthening. Patient unable to demonstrate standing and perform walking in today's session. Pt tolerated wheelchair mobility throughout clinic in order to improve LE  strength and endurance. Pt required frequent rest breaks throughout session due to fatigue. HR and O2 Sat monitored throughout session; within normal limits. Currently, she continues to present with decreased activity tolerance and LE strength; PT strongly recommends PT services to address deficits in strength, balance, and mobility in order to return to full function  at home.   OBJECTIVE IMPAIRMENTS: decreased activity tolerance, decreased endurance, difficulty walking, decreased strength, dizziness, obesity, and pain.   ACTIVITY LIMITATIONS: carrying, lifting, standing, squatting, and stairs  PARTICIPATION LIMITATIONS: driving, community activity, and yard work  PERSONAL FACTORS: Age, Past/current experiences, Time since onset of injury/illness/exacerbation, and 3+ comorbidities: CKD III, DM, Osteoporosis   are also affecting patient's functional outcome.   REHAB POTENTIAL: Fair patient with progressive weakening and decreased activity tolerance.  CLINICAL DECISION MAKING: Evolving/moderate complexity  EVALUATION COMPLEXITY: Moderate   GOALS: Goals reviewed with patient? Yes  SHORT TERM GOALS: Target date: 05/06/2023  Pt will be independent with HEP in order to improve strength and balance in order to decrease fall risk and improve function at home. Baseline:  Goal status: INITIAL   LONG TERM GOALS: Target date: 05/06/2023  Pt will increase FOTO to at least 56 to demonstrate significant improvement in function at home related to balance  Baseline: 01/21/23: 45 Goal status: INITIAL  2. Pt will decrease TUG to below 14 seconds/decrease in order to demonstrate decreased fall risk.  Baseline: 01/21/2023: 24.68s Goal status: INITIAL  3. Pt will increase self-selected by at least 0.13 m/s in order to demonstrate clinically significant improvement in community ambulation.   Baseline: 01/21/2023: .56 m/s  Goal status: INITIAL   PLAN: PT FREQUENCY: 2x/week  PT DURATION: 8  weeks  PLANNED INTERVENTIONS: Therapeutic exercises, Therapeutic activity, Neuromuscular re-education, Balance training, Gait training, Patient/Family education, Self Care, Joint mobilization, Joint manipulation, Vestibular training, Canalith repositioning, Orthotic/Fit training, DME instructions, Dry Needling, Electrical stimulation, Spinal manipulation, Spinal mobilization, Cryotherapy, Moist heat, Taping, Traction, Ultrasound, Ionotophoresis 4mg /ml Dexamethasone, Manual therapy, and Re-evaluation.  PLAN FOR NEXT SESSION: Assess Hip and knee ROM, Progress LE strength,   Myson Levi, SPT 02/11/2023, 2:07 PM

## 2023-02-17 ENCOUNTER — Ambulatory Visit: Payer: Medicare Other

## 2023-02-19 ENCOUNTER — Ambulatory Visit: Payer: Medicare Other

## 2023-05-13 DEATH — deceased
